# Patient Record
Sex: Male | Born: 1950 | Race: White | Hispanic: No | Marital: Married | Smoking: Former smoker
Health system: Southern US, Community
[De-identification: ages and names within clinical notes are randomized; demographics above are authoritative.]

## PROBLEM LIST (undated history)

## (undated) DIAGNOSIS — K760 Fatty (change of) liver, not elsewhere classified: Secondary | ICD-10-CM

## (undated) DIAGNOSIS — R7301 Impaired fasting glucose: Secondary | ICD-10-CM

## (undated) DIAGNOSIS — R011 Cardiac murmur, unspecified: Secondary | ICD-10-CM

## (undated) DIAGNOSIS — K219 Gastro-esophageal reflux disease without esophagitis: Secondary | ICD-10-CM

## (undated) DIAGNOSIS — R7303 Prediabetes: Secondary | ICD-10-CM

## (undated) DIAGNOSIS — K7460 Unspecified cirrhosis of liver: Secondary | ICD-10-CM

## (undated) DIAGNOSIS — Z87442 Personal history of urinary calculi: Secondary | ICD-10-CM

## (undated) DIAGNOSIS — R001 Bradycardia, unspecified: Secondary | ICD-10-CM

## (undated) DIAGNOSIS — R7989 Other specified abnormal findings of blood chemistry: Secondary | ICD-10-CM

## (undated) DIAGNOSIS — T7840XA Allergy, unspecified, initial encounter: Secondary | ICD-10-CM

## (undated) DIAGNOSIS — M109 Gout, unspecified: Secondary | ICD-10-CM

## (undated) DIAGNOSIS — Z973 Presence of spectacles and contact lenses: Secondary | ICD-10-CM

## (undated) DIAGNOSIS — N400 Enlarged prostate without lower urinary tract symptoms: Secondary | ICD-10-CM

## (undated) DIAGNOSIS — E119 Type 2 diabetes mellitus without complications: Secondary | ICD-10-CM

## (undated) DIAGNOSIS — N189 Chronic kidney disease, unspecified: Secondary | ICD-10-CM

## (undated) DIAGNOSIS — I878 Other specified disorders of veins: Secondary | ICD-10-CM

## (undated) DIAGNOSIS — I340 Nonrheumatic mitral (valve) insufficiency: Secondary | ICD-10-CM

## (undated) DIAGNOSIS — I10 Essential (primary) hypertension: Secondary | ICD-10-CM

## (undated) HISTORY — DX: Cardiac murmur, unspecified: R01.1

## (undated) HISTORY — DX: Other specified disorders of veins: I87.8

## (undated) HISTORY — PX: EXTRACORPOREAL SHOCK WAVE LITHOTRIPSY: SHX1557

## (undated) HISTORY — PX: COLONOSCOPY: SHX174

## (undated) HISTORY — DX: Impaired fasting glucose: R73.01

## (undated) HISTORY — DX: Bradycardia, unspecified: R00.1

## (undated) HISTORY — DX: Other specified abnormal findings of blood chemistry: R79.89

## (undated) HISTORY — DX: Allergy, unspecified, initial encounter: T78.40XA

## (undated) HISTORY — PX: KIDNEY STONE SURGERY: SHX686

## (undated) HISTORY — DX: Benign prostatic hyperplasia without lower urinary tract symptoms: N40.0

## (undated) HISTORY — DX: Nonrheumatic mitral (valve) insufficiency: I34.0

## (undated) HISTORY — PX: CHOLECYSTECTOMY: SHX55

## (undated) HISTORY — DX: Gout, unspecified: M10.9

## (undated) HISTORY — DX: Fatty (change of) liver, not elsewhere classified: K76.0

## (undated) HISTORY — DX: Unspecified cirrhosis of liver: K74.60

## (undated) HISTORY — DX: Chronic kidney disease, unspecified: N18.9

## (undated) HISTORY — PX: CARDIOVASCULAR STRESS TEST: SHX262

## (undated) HISTORY — PX: VASECTOMY: SHX75

## (undated) HISTORY — DX: Gastro-esophageal reflux disease without esophagitis: K21.9

---

## 2001-06-28 ENCOUNTER — Encounter: Payer: Self-pay | Admitting: Urology

## 2001-06-28 ENCOUNTER — Ambulatory Visit (HOSPITAL_COMMUNITY): Admission: RE | Admit: 2001-06-28 | Discharge: 2001-06-28 | Payer: Self-pay | Admitting: Urology

## 2001-06-30 ENCOUNTER — Encounter: Payer: Self-pay | Admitting: Urology

## 2001-06-30 ENCOUNTER — Ambulatory Visit (HOSPITAL_COMMUNITY): Admission: RE | Admit: 2001-06-30 | Discharge: 2001-06-30 | Payer: Self-pay | Admitting: Urology

## 2001-07-20 ENCOUNTER — Ambulatory Visit (HOSPITAL_COMMUNITY): Admission: RE | Admit: 2001-07-20 | Discharge: 2001-07-20 | Payer: Self-pay | Admitting: Urology

## 2001-09-23 ENCOUNTER — Ambulatory Visit (HOSPITAL_COMMUNITY): Admission: RE | Admit: 2001-09-23 | Discharge: 2001-09-23 | Payer: Self-pay | Admitting: Urology

## 2001-09-23 ENCOUNTER — Encounter: Payer: Self-pay | Admitting: Urology

## 2002-03-28 ENCOUNTER — Ambulatory Visit (HOSPITAL_COMMUNITY): Admission: RE | Admit: 2002-03-28 | Discharge: 2002-03-28 | Payer: Self-pay | Admitting: Urology

## 2002-03-28 ENCOUNTER — Encounter: Payer: Self-pay | Admitting: Urology

## 2002-10-06 ENCOUNTER — Ambulatory Visit (HOSPITAL_COMMUNITY): Admission: RE | Admit: 2002-10-06 | Discharge: 2002-10-06 | Payer: Self-pay | Admitting: Urology

## 2002-10-06 ENCOUNTER — Encounter: Payer: Self-pay | Admitting: Urology

## 2003-01-05 ENCOUNTER — Ambulatory Visit (HOSPITAL_COMMUNITY): Admission: RE | Admit: 2003-01-05 | Discharge: 2003-01-05 | Payer: Self-pay | Admitting: General Surgery

## 2003-12-03 ENCOUNTER — Ambulatory Visit (HOSPITAL_COMMUNITY): Admission: RE | Admit: 2003-12-03 | Discharge: 2003-12-03 | Payer: Self-pay | Admitting: Family Medicine

## 2005-06-01 ENCOUNTER — Ambulatory Visit (HOSPITAL_COMMUNITY): Admission: RE | Admit: 2005-06-01 | Discharge: 2005-06-01 | Payer: Self-pay | Admitting: Family Medicine

## 2005-06-15 ENCOUNTER — Ambulatory Visit (HOSPITAL_COMMUNITY): Admission: RE | Admit: 2005-06-15 | Discharge: 2005-06-15 | Payer: Self-pay | Admitting: Family Medicine

## 2006-07-05 ENCOUNTER — Ambulatory Visit (HOSPITAL_COMMUNITY): Admission: RE | Admit: 2006-07-05 | Discharge: 2006-07-05 | Payer: Self-pay | Admitting: Family Medicine

## 2008-05-18 ENCOUNTER — Ambulatory Visit (HOSPITAL_COMMUNITY): Admission: RE | Admit: 2008-05-18 | Discharge: 2008-05-18 | Payer: Self-pay | Admitting: General Surgery

## 2008-05-18 ENCOUNTER — Encounter (INDEPENDENT_AMBULATORY_CARE_PROVIDER_SITE_OTHER): Payer: Self-pay | Admitting: General Surgery

## 2010-04-27 ENCOUNTER — Encounter: Payer: Self-pay | Admitting: Family Medicine

## 2012-06-22 ENCOUNTER — Encounter: Payer: Self-pay | Admitting: Family Medicine

## 2012-06-22 ENCOUNTER — Ambulatory Visit (INDEPENDENT_AMBULATORY_CARE_PROVIDER_SITE_OTHER): Payer: Managed Care, Other (non HMO) | Admitting: Family Medicine

## 2012-06-22 VITALS — BP 120/64 | Temp 98.8°F | Wt 225.4 lb

## 2012-06-22 DIAGNOSIS — J329 Chronic sinusitis, unspecified: Secondary | ICD-10-CM

## 2012-06-22 DIAGNOSIS — I10 Essential (primary) hypertension: Secondary | ICD-10-CM

## 2012-06-22 MED ORDER — LEVOFLOXACIN 500 MG PO TABS: 500.0000 mg | ORAL_TABLET | Freq: Every day | ORAL | Status: AC

## 2012-06-22 NOTE — Progress Notes (Signed)
  Subjective:    Patient ID: Mark Leonard, male    DOB: 09-13-1950, 62 y.o.   MRN: [id number]  Sinusitis This is a recurrent problem. The current episode started 1 to 4 weeks ago. The problem has been gradually worsening since onset. There has been no fever. The pain is mild. Associated symptoms include congestion, coughing (productive), headaches, a hoarse voice and sinus pressure. Past treatments include oral decongestants and acetaminophen. The treatment provided mild relief.      Review of Systems  HENT: Positive for congestion, hoarse voice and sinus pressure.   Respiratory: Positive for cough (productive).   Neurological: Positive for headaches.  All other systems reviewed and are negative.       Objective:   Physical Exam  Constitutional: He appears well-developed.  HENT:  Head: Normocephalic.  Right Ear: External ear normal.  Left Ear: External ear normal.  Eyes: Pupils are equal, round, and reactive to light.  Neck: Normal range of motion. Neck supple.  Cardiovascular: Normal rate and regular rhythm.   Pulmonary/Chest: Effort normal. No respiratory distress.  Abdominal: Bowel sounds are normal.  Musculoskeletal: Normal range of motion.  Lymphadenopathy:    He has cervical adenopathy.  Neurological: He is alert.  Skin: Skin is warm.          Assessment & Plan:  Impression #1 acute sinusitis. Plan Levaquin 500 milligrams daily. Symptomatic care discussed. Warning signs discussed.

## 2012-08-25 ENCOUNTER — Other Ambulatory Visit: Payer: Self-pay | Admitting: Family Medicine

## 2012-09-21 ENCOUNTER — Ambulatory Visit (HOSPITAL_COMMUNITY)
Admission: RE | Admit: 2012-09-21 | Discharge: 2012-09-21 | Disposition: A | Discharge status: Home / Self Care | Payer: Managed Care, Other (non HMO) | Source: Ambulatory Visit | Attending: Family Medicine | Admitting: Family Medicine

## 2012-09-21 ENCOUNTER — Encounter: Payer: Self-pay | Admitting: Family Medicine

## 2012-09-21 ENCOUNTER — Ambulatory Visit (INDEPENDENT_AMBULATORY_CARE_PROVIDER_SITE_OTHER): Payer: Managed Care, Other (non HMO) | Admitting: Family Medicine

## 2012-09-21 VITALS — BP 142/94 | HR 80 | Ht 67.0 in | Wt 228.0 lb

## 2012-09-21 DIAGNOSIS — K7689 Other specified diseases of liver: Secondary | ICD-10-CM

## 2012-09-21 DIAGNOSIS — M25562 Pain in left knee: Secondary | ICD-10-CM

## 2012-09-21 DIAGNOSIS — K219 Gastro-esophageal reflux disease without esophagitis: Secondary | ICD-10-CM

## 2012-09-21 DIAGNOSIS — I878 Other specified disorders of veins: Secondary | ICD-10-CM | POA: Insufficient documentation

## 2012-09-21 DIAGNOSIS — R7301 Impaired fasting glucose: Secondary | ICD-10-CM | POA: Insufficient documentation

## 2012-09-21 DIAGNOSIS — N400 Enlarged prostate without lower urinary tract symptoms: Secondary | ICD-10-CM

## 2012-09-21 DIAGNOSIS — M109 Gout, unspecified: Secondary | ICD-10-CM | POA: Insufficient documentation

## 2012-09-21 DIAGNOSIS — N138 Other obstructive and reflux uropathy: Secondary | ICD-10-CM | POA: Insufficient documentation

## 2012-09-21 DIAGNOSIS — M25569 Pain in unspecified knee: Secondary | ICD-10-CM

## 2012-09-21 DIAGNOSIS — I872 Venous insufficiency (chronic) (peripheral): Secondary | ICD-10-CM

## 2012-09-21 DIAGNOSIS — K760 Fatty (change of) liver, not elsewhere classified: Secondary | ICD-10-CM | POA: Insufficient documentation

## 2012-09-21 MED ORDER — ALLOPURINOL 300 MG PO TABS: 300.0000 mg | ORAL_TABLET | Freq: Every day | ORAL | Status: DC

## 2012-09-21 MED ORDER — DICLOFENAC SODIUM 75 MG PO TBEC: 75.0000 mg | DELAYED_RELEASE_TABLET | Freq: Two times a day (BID) | ORAL | Status: DC

## 2012-09-21 MED ORDER — ENALAPRIL MALEATE 20 MG PO TABS: 20.0000 mg | ORAL_TABLET | Freq: Every day | ORAL | Status: DC

## 2012-09-21 MED ORDER — ESOMEPRAZOLE MAGNESIUM 40 MG PO CPDR: 40.0000 mg | DELAYED_RELEASE_CAPSULE | Freq: Every day | ORAL | Status: DC

## 2012-09-21 MED ORDER — FINASTERIDE 5 MG PO TABS: 5.0000 mg | ORAL_TABLET | Freq: Every day | ORAL | Status: DC

## 2012-09-21 MED ORDER — METRONIDAZOLE 0.75 % EX CREA: TOPICAL_CREAM | Freq: Two times a day (BID) | CUTANEOUS | Status: DC

## 2012-09-21 MED ORDER — ENALAPRIL MALEATE 10 MG PO TABS: 10.0000 mg | ORAL_TABLET | Freq: Every day | ORAL | Status: DC

## 2012-09-21 NOTE — Patient Instructions (Signed)
Try to watch your diet and exercise regularly

## 2012-09-21 NOTE — Progress Notes (Signed)
  Subjective:    Patient ID: Mark Leonard, male    DOB: 1951/02/01, 62 y.o.   MRN: [id number]  HPI conmpliant with meds.  Trying to watch his diet--not the best, not exercising much.  Increased stress with family issues, mo living ewith them  Left medial knee pain, hurts to walk and rolll over. agravated by hard surface and steel toes. Recalls no acute injur. ibu 600 bid not helping much.  Return of gout like pain in left foot  Review of Systems  Constitutional: Negative for fever, activity change and appetite change.  HENT: Negative for congestion, rhinorrhea and neck pain.   Eyes: Negative for discharge.  Respiratory: Negative for cough and wheezing.   Cardiovascular: Negative for chest pain.  Gastrointestinal: Negative for vomiting, abdominal pain and blood in stool.  Genitourinary: Negative for frequency and difficulty urinating.  Musculoskeletal: Positive for joint swelling.  Skin: Negative for rash.  Allergic/Immunologic: Negative for environmental allergies and food allergies.  Neurological: Negative for weakness and headaches.  Psychiatric/Behavioral: Negative for agitation.       Objective:   Physical Exam  Vitals reviewed. Constitutional: He appears well-developed and well-nourished.  Obesity present  HENT:  Head: Normocephalic and atraumatic.  Right Ear: External ear normal.  Left Ear: External ear normal.  Nose: Nose normal.  Mouth/Throat: Oropharynx is clear and moist.  Eyes: EOM are normal. Pupils are equal, round, and reactive to light.  Neck: Normal range of motion. Neck supple. No thyromegaly present.  Cardiovascular: Normal rate, regular rhythm and normal heart sounds.   No murmur heard. Pulmonary/Chest: Effort normal and breath sounds normal. No respiratory distress. He has no wheezes.  Abdominal: Soft. Bowel sounds are normal. He exhibits no distension and no mass. There is no tenderness.  Genitourinary: Penis normal.  Musculoskeletal: Normal  range of motion. He exhibits no edema.  Distinct tenderness left medial knee. Some crepitations bilaterally.  Lymphadenopathy:    He has no cervical adenopathy.  Neurological: He is alert. He exhibits normal muscle tone.  Skin: Skin is warm and dry. No erythema.  Psychiatric: He has a normal mood and affect. His behavior is normal. Judgment normal.          Assessment & Plan:  Impression #1 wellness exam. #2 knee pain possibly flare of arthritis but need to consider medial meniscus. Plan Hemoccult cards. Appropriate blood work. Meds refilled. Trial Voltaren 75 twice a day with food for knee. X-ray of knee. Check every 6 months. Diet and exercise discussed in encourage.

## 2012-10-18 ENCOUNTER — Ambulatory Visit (INDEPENDENT_AMBULATORY_CARE_PROVIDER_SITE_OTHER): Payer: Managed Care, Other (non HMO) | Admitting: Family Medicine

## 2012-10-18 ENCOUNTER — Encounter: Payer: Self-pay | Admitting: Family Medicine

## 2012-10-18 VITALS — BP 132/84 | Wt 233.8 lb

## 2012-10-18 DIAGNOSIS — M545 Low back pain: Secondary | ICD-10-CM

## 2012-10-18 NOTE — Patient Instructions (Signed)
Back Exercises Back exercises help treat and prevent back injuries. The goal of back exercises is to increase the strength of your abdominal and back muscles and the flexibility of your back. These exercises should be started when you no longer have back pain. Back exercises include:  Pelvic Tilt. Lie on your back with your knees bent. Tilt your pelvis until the lower part of your back is against the floor. Hold this position 5 to 10 sec and repeat 5 to 10 times.  Knee to Chest. Pull first 1 knee up against your chest and hold for 20 to 30 seconds, repeat this with the other knee, and then both knees. This may be done with the other leg straight or bent, whichever feels better.  Sit-Ups or Curl-Ups. Bend your knees 90 degrees. Start with tilting your pelvis, and do a partial, slow sit-up, lifting your trunk only 30 to 45 degrees off the floor. Take at least 2 to 3 seconds for each sit-up. Do not do sit-ups with your knees out straight. If partial sit-ups are difficult, simply do the above but with only tightening your abdominal muscles and holding it as directed.  Hip-Lift. Lie on your back with your knees flexed 90 degrees. Push down with your feet and shoulders as you raise your hips a couple inches off the floor; hold for 10 seconds, repeat 5 to 10 times.  Back arches. Lie on your stomach, propping yourself up on bent elbows. Slowly press on your hands, causing an arch in your low back. Repeat 3 to 5 times. Any initial stiffness and discomfort should lessen with repetition over time.  Shoulder-Lifts. Lie face down with arms beside your body. Keep hips and torso pressed to floor as you slowly lift your head and shoulders off the floor. Do not overdo your exercises, especially in the beginning. Exercises may cause you some mild back discomfort which lasts for a few minutes; however, if the pain is more severe, or lasts for more than 15 minutes, do not continue exercises until you see your caregiver.  Improvement with exercise therapy for back problems is slow.  See your caregivers for assistance with developing a proper back exercise program. Document Released: 04/30/2004 Document Revised: 06/15/2011 Document Reviewed: 01/22/2011 [employer] Patient Information 2014 [employer].  

## 2012-10-18 NOTE — Progress Notes (Signed)
  Subjective:    Patient ID: Mark Leonard, male    DOB: [birth year], 62 y.o.   MRN: [id number]  Back Pain This is a new problem. The current episode started 1 to 4 weeks ago. The problem occurs constantly. The problem has been gradually worsening since onset. The pain is present in the lumbar spine. The pain radiates to the left knee. The pain is at a severity of 6/10. The pain is moderate. The pain is worse during the day. The symptoms are aggravated by bending. Stiffness is present in the morning. Pertinent negatives include no numbness or paresthesias. Risk factors include lack of exercise (also working sig overtime). He has tried analgesics for the symptoms. The treatment provided mild relief.    Bad pain at night too. Stiff. Worse with movemnent. Pain has improved  Review of Systems  Musculoskeletal: Positive for back pain.  Neurological: Negative for numbness and paresthesias.       Objective:   Physical Exam  Alert no acute distress. Lungs clear. Heart regular rate and rhythm. Left lower lumbar tenderness to deep palpation. Negative straight leg raise. Knee less tenderness.      Assessment & Plan:  Impression lumbar strain-discussed. Local measures discussed. Anti-inflammatory medicine when necessary. Chlorzoxazone when necessary. Exercise long-term important discussed. WSL

## 2013-03-13 ENCOUNTER — Encounter: Payer: Self-pay | Admitting: Family Medicine

## 2013-03-13 ENCOUNTER — Ambulatory Visit (INDEPENDENT_AMBULATORY_CARE_PROVIDER_SITE_OTHER): Payer: Managed Care, Other (non HMO) | Admitting: Family Medicine

## 2013-03-13 VITALS — BP 138/88 | Ht 68.0 in | Wt 228.8 lb

## 2013-03-13 DIAGNOSIS — R7301 Impaired fasting glucose: Secondary | ICD-10-CM

## 2013-03-13 DIAGNOSIS — N400 Enlarged prostate without lower urinary tract symptoms: Secondary | ICD-10-CM

## 2013-03-13 DIAGNOSIS — E119 Type 2 diabetes mellitus without complications: Secondary | ICD-10-CM

## 2013-03-13 DIAGNOSIS — K219 Gastro-esophageal reflux disease without esophagitis: Secondary | ICD-10-CM

## 2013-03-13 DIAGNOSIS — Z79899 Other long term (current) drug therapy: Secondary | ICD-10-CM

## 2013-03-13 DIAGNOSIS — E782 Mixed hyperlipidemia: Secondary | ICD-10-CM

## 2013-03-13 DIAGNOSIS — M109 Gout, unspecified: Secondary | ICD-10-CM

## 2013-03-13 DIAGNOSIS — I10 Essential (primary) hypertension: Secondary | ICD-10-CM

## 2013-03-13 MED ORDER — ESOMEPRAZOLE MAGNESIUM 40 MG PO CPDR: 40.0000 mg | DELAYED_RELEASE_CAPSULE | Freq: Every day | ORAL | Status: DC

## 2013-03-13 MED ORDER — FINASTERIDE 5 MG PO TABS: 5.0000 mg | ORAL_TABLET | Freq: Every day | ORAL | Status: DC

## 2013-03-13 MED ORDER — ENALAPRIL MALEATE 20 MG PO TABS: 20.0000 mg | ORAL_TABLET | Freq: Every day | ORAL | Status: DC

## 2013-03-13 MED ORDER — ALLOPURINOL 300 MG PO TABS: 300.0000 mg | ORAL_TABLET | Freq: Every day | ORAL | Status: DC

## 2013-03-13 MED ORDER — METRONIDAZOLE 0.75 % EX CREA: TOPICAL_CREAM | Freq: Two times a day (BID) | CUTANEOUS | Status: DC

## 2013-03-13 NOTE — Progress Notes (Signed)
   Subjective:    Patient ID: Mark Leonard, male    DOB: 07-21-1950, 62 y.o.   MRN: [id number]  HPI Patient arrives for a follow up on blood pressure. Notes increased salt intake. Had more than he should. 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 BP syst in 140s.  and to discuss blood work results from work.  No attacks og gout. Claims compliance with medications.  Organ meats seemed to make worse,   Heartburn and reflux overall stable. Takes reflux med first thing in the morning.  Overall urinating at night Sunder good control. Compliant with his medication for this. No obvious side effects.  Brings in blood work from work place. This includes an A1c of 6.8%.    Review of Systems Claims no excess her is no headache no chest pain no shortness breath no abdominal pain no change in bowel habits notes ongoing weight gain   ROS otherwise negative Objective:   Physical Exam Alert HEENT normal. Lungs clear. Heart regular in rhythm. Blood pressure 134/82 on repeat. Ankles without edema. Labs reviewed       Assessment & Plan:  Pressure 1 hypertension good control. #2 reflux good control. #3 prostate hypertrophy stable. #4 gout no recent recurrence. #5 diabetes new diagnosis discussed at length plan appropriate blood work return for full diabetes visit soon. WSL

## 2013-03-15 LAB — LIPID PANEL
Cholesterol: 69 mg/dL (ref 0–200)
HDL: 35 mg/dL — ABNORMAL LOW (ref >39)

## 2013-03-15 LAB — HEPATIC FUNCTION PANEL
Albumin: 3.9 g/dL (ref 3.5–5.2)
Alkaline Phosphatase: 99 U/L (ref 39–117)
Indirect Bilirubin: 0.4 mg/dL (ref 0.0–0.9)
Total Bilirubin: 0.7 mg/dL (ref 0.3–1.2)
Total Protein: 6.6 g/dL (ref 6.0–8.3)

## 2013-03-15 LAB — MICROALBUMIN, URINE: Microalb, Ur: 0.5 mg/dL (ref 0.00–1.89)

## 2013-03-24 ENCOUNTER — Ambulatory Visit (INDEPENDENT_AMBULATORY_CARE_PROVIDER_SITE_OTHER): Payer: Managed Care, Other (non HMO) | Admitting: Family Medicine

## 2013-03-24 ENCOUNTER — Encounter: Payer: Self-pay | Admitting: Family Medicine

## 2013-03-24 VITALS — BP 130/80 | Ht 68.0 in | Wt 223.0 lb

## 2013-03-24 DIAGNOSIS — R739 Hyperglycemia, unspecified: Secondary | ICD-10-CM

## 2013-03-24 DIAGNOSIS — Z23 Encounter for immunization: Secondary | ICD-10-CM

## 2013-03-24 DIAGNOSIS — R7309 Other abnormal glucose: Secondary | ICD-10-CM

## 2013-03-24 NOTE — Progress Notes (Signed)
   Subjective:    Patient ID: Mark Leonard, male    DOB: 06-02-1950, 62 y.o.   MRN: [id number]  HPI  Patient arrives to follow up on recent blood work results.  Patient had blood work at his workplace which showed an A1c of 6.8%. He returns for further discussion in this regard with his diagnosis of new onset type 2 diabetes. Notes some increased urination. Some hot visual changes. Next  Also notes very poor diet.  Also unfortunately still does not exercise, though he worked very hard with his job.  Compliant with other medications. Results for orders placed in visit on 03/24/13  GLUCOSE, POCT (MANUAL RESULT ENTRY)      Result Value Range   POC Glucose 102 (*) 70 - 99 mg/dl  POCT GLYCOSYLATED HEMOGLOBIN (HGB A1C)      Result Value Range   Hemoglobin A1C 6.8       Review of Systems No headache no chest pain no back pain no abdominal pain no change in bowel habits no blood in stool    Objective:   Physical Exam  Alert HEENT normal. Lungs clear. Heart regular in rhythm. Ankles without edema.      Assessment & Plan:  Impression 1 new onset type 2 diabetes discussed at great length. Please see patient instructions. Each of these generated considerable questions discussion Mark Leonard. Easily 35 minutes spent with patient most in discussion. Plan, or prescribed. 2 early for medications. Pneumonia shot today. Followup as scheduled. Check a couple fasting sugars per week. Attending [employer] educational session. Rationale discussed. Educational information given. WSL

## 2013-03-24 NOTE — Patient Instructions (Signed)
Yearly eye exams from here on out  6.8% on A1c--this reconfirms the diagnosis, one that is permanent  ADA says that fasting sugar goals are from 70 to 130  Pneumonia vaccine is a good idea  Shingles vaccine is a good idea Diabetes and Exercise Exercising regularly is important. It is not just about losing weight. It has many health benefits, such as:  Improving your overall fitness, flexibility, and endurance.  Increasing your bone density.  Helping with weight control.  Decreasing your body fat.  Increasing your muscle strength.  Reducing stress and tension.  Improving your overall health. People with diabetes who exercise gain additional benefits because exercise:  Reduces appetite.  Improves the body's use of blood sugar (glucose).  Helps lower or control blood glucose.  Decreases blood pressure.  Helps control blood lipids (such as cholesterol and triglycerides).  Improves the body's use of the hormone insulin by:  Increasing the body's insulin sensitivity.  Reducing the body's insulin needs.  Decreases the risk for heart disease because exercising:  Lowers cholesterol and triglycerides levels.  Increases the levels of good cholesterol (such as high-density lipoproteins [HDL]) in the body.  Lowers blood glucose levels. YOUR ACTIVITY PLAN  Choose an activity that you enjoy and set realistic goals. Your health care provider or diabetes educator can help you make an activity plan that works for you. You can break activities into 2 or 3 sessions throughout the day. Doing so is as good as one long session. Exercise ideas include:  Taking the dog for a walk.  Taking the stairs instead of the elevator.  Dancing to your favorite song.  Doing your favorite exercise with a friend. RECOMMENDATIONS FOR EXERCISING WITH TYPE 1 OR TYPE 2 DIABETES   Check your blood glucose before exercising. If blood glucose levels are greater than 240 mg/dL, check for urine  ketones. Do not exercise if ketones are present.  Avoid injecting insulin into areas of the body that are going to be exercised. For example, avoid injecting insulin into:  The arms when playing tennis.  The legs when jogging.  Keep a record of:  Food intake before and after you exercise.  Expected peak times of insulin action.  Blood glucose levels before and after you exercise.  The type and amount of exercise you have done.  Review your records with your health care provider. Your health care provider will help you to develop guidelines for adjusting food intake and insulin amounts before and after exercising.  If you take insulin or oral hypoglycemic agents, watch for signs and symptoms of hypoglycemia. They include:  Dizziness.  Shaking.  Sweating.  Chills.  Confusion.  Drink plenty of water while you exercise to prevent dehydration or heat stroke. Body water is lost during exercise and must be replaced.  Talk to your health care provider before starting an exercise program to make sure it is safe for you. Remember, almost any type of activity is better than none. Document Released: 06/13/2003 Document Revised: 11/23/2012 Document Reviewed: 08/30/2012 [facility] Patient Information 2014 [place], [place]. Exercise is crucial, need to try and do three to four times per wk  If you can lose only ten to fifteen pounds of fat, that will considerably change how your body metabolized sugar  Good idea to do the educational session at the hospital--see the sheet  Medication for diabetes: we initiate when the A1c exceeds 7.0 per cent, lots of good choices

## 2013-03-27 ENCOUNTER — Telehealth: Payer: Self-pay | Admitting: Family Medicine

## 2013-03-27 NOTE — Telephone Encounter (Signed)
It doesn't matter which brand name glucometer the patient uses. Patient notified.

## 2013-03-27 NOTE — Telephone Encounter (Signed)
Patient says that he called the insurance company to see if they had a preference for the glucose machine that they pay for, but they told him they would just send him one in the mail. He called to make sure that it was okay if the brand was switched?

## 2013-04-07 ENCOUNTER — Telehealth (HOSPITAL_COMMUNITY): Payer: Self-pay | Admitting: Dietician

## 2013-04-07 NOTE — Telephone Encounter (Signed)
Called at 1011. Pt registered for 04/11/12 at 1000 (group class).

## 2013-04-07 NOTE — Telephone Encounter (Signed)
Received call from pt at 0940. Recently dx DM, Dr. Wolfgang Phoenix wants him to attend class.

## 2013-04-11 ENCOUNTER — Encounter (HOSPITAL_COMMUNITY): Payer: Self-pay | Admitting: Dietician

## 2013-04-11 NOTE — Progress Notes (Signed)
[facility] Diabetes Class Completion  Date:April 11, 2013  Time: 1000  Pt attended [facility]'s Diabetes Group Education Class on April 11, 2013.   Patient was educated on the following topics:   -Survival skills (signs and symptoms of hyperglycemia and hypoglycemia, treatment for hypoglycemia, ideal levels for fasting and postprandial blood sugars, goal Hgb A1c level, foot care basics)  -Recommendations for physical activity   -Carbohydrate metabolism in relation to diabetes   -Meal planning (sources of carbohydrate, carbohydrate counting, meal planning strategies, food label reading, and portion control).  Handouts provided:  -"Diabetes and You: Taking Charge of Your Health"  -"Carbohydrate Counting and Meal Planning"  -"Your Guide to Better Office Visits"   Nikitta Sobiech A. Evelyn Aguinaldo, RD, LDN  

## 2013-04-14 ENCOUNTER — Telehealth: Payer: Self-pay | Admitting: Family Medicine

## 2013-04-14 NOTE — Telephone Encounter (Signed)
Patient notified

## 2013-04-14 NOTE — Telephone Encounter (Signed)
Patient needs Rx for One Touch Strips (Insurance sent him a one touch monitor)   [facility] [place]

## 2013-05-11 ENCOUNTER — Telehealth: Payer: Self-pay | Admitting: Family Medicine

## 2013-05-11 NOTE — Telephone Encounter (Signed)
Patient notified

## 2013-05-11 NOTE — Telephone Encounter (Signed)
Patient needs Rx for shingles shot

## 2013-05-26 ENCOUNTER — Encounter: Payer: Self-pay | Admitting: Family Medicine

## 2013-05-26 ENCOUNTER — Ambulatory Visit (INDEPENDENT_AMBULATORY_CARE_PROVIDER_SITE_OTHER): Payer: Managed Care, Other (non HMO) | Admitting: Family Medicine

## 2013-05-26 VITALS — BP 118/80 | Ht 67.0 in | Wt 208.0 lb

## 2013-05-26 DIAGNOSIS — I10 Essential (primary) hypertension: Secondary | ICD-10-CM

## 2013-05-26 DIAGNOSIS — K219 Gastro-esophageal reflux disease without esophagitis: Secondary | ICD-10-CM

## 2013-05-26 DIAGNOSIS — E119 Type 2 diabetes mellitus without complications: Secondary | ICD-10-CM

## 2013-05-26 MED ORDER — AMOXICILLIN-POT CLAVULANATE 875-125 MG PO TABS: 1.0000 | ORAL_TABLET | Freq: Two times a day (BID) | ORAL | Status: AC

## 2013-05-26 NOTE — Progress Notes (Signed)
   Subjective:    Patient ID: Mark Leonard, male    DOB: 12-06-1950, 63 y.o.   MRN: [id number]  HPIFollow up on bloodwork done in December. A1C 6.8 in Dec. Pt took diabetic class in January. Patient brought in blood sugar readings.   Fasting numbers mostly 80 to 90  Walked three miles for one wk  By changing shifts, went off exercise program  No exercise equip at the house,  Check ears. Having some ear pain. Started about 2 weeks ago. Hx of chronic left ear, stopped up at times  Lot of ringing in ear Also pain in the face,  Hx of discomfort and   Sinus cong and drainage recently No obvious fever.  Compliant with blood pressure medicine.  Start off with exercising but now not at all.   Review of Systems No headache no chest pain decent appetite some weight loss. No abdominal pain no change in bowel habits no blood in stools ROS otherwise negative    Objective:   Physical Exam Alert no apparent distress. Mild malaise. H&T moderate his congestion frontal tenderness pharynx slight erythema neck supple. Blood pressure good on repeat lungs clear. Heart regular in rhythm. Abdomen benign. Ankles edema. Feet sensation intact pulses good no edema.       Assessment & Plan:   impression 1 type 2 diabetes control improving discussed at length #2 hypertension good control. #Early rhinosinusitis discussed plan may change in glucose checks once per week strongly encouraged to exercise. Antibiotics written. Maintain other medicines. Diet exercise discussed. Recheck in several months. WSL

## 2013-05-27 DIAGNOSIS — E119 Type 2 diabetes mellitus without complications: Secondary | ICD-10-CM | POA: Insufficient documentation

## 2013-09-20 ENCOUNTER — Other Ambulatory Visit: Payer: Self-pay | Admitting: Family Medicine

## 2013-10-27 ENCOUNTER — Encounter: Payer: Self-pay | Admitting: Family Medicine

## 2013-10-27 ENCOUNTER — Ambulatory Visit (INDEPENDENT_AMBULATORY_CARE_PROVIDER_SITE_OTHER): Payer: Managed Care, Other (non HMO) | Admitting: Family Medicine

## 2013-10-27 VITALS — BP 130/84 | Resp 20 | Ht 67.0 in | Wt 209.0 lb

## 2013-10-27 DIAGNOSIS — Z0189 Encounter for other specified special examinations: Secondary | ICD-10-CM

## 2013-10-27 DIAGNOSIS — E119 Type 2 diabetes mellitus without complications: Secondary | ICD-10-CM

## 2013-10-27 LAB — POCT GLYCOSYLATED HEMOGLOBIN (HGB A1C): HEMOGLOBIN A1C: 5.6

## 2013-10-27 MED ORDER — FINASTERIDE 5 MG PO TABS: ORAL_TABLET | ORAL | Status: DC

## 2013-10-27 MED ORDER — ENALAPRIL MALEATE 20 MG PO TABS: ORAL_TABLET | ORAL | Status: DC

## 2013-10-27 MED ORDER — ALLOPURINOL 300 MG PO TABS: ORAL_TABLET | ORAL | Status: DC

## 2013-10-27 MED ORDER — ESOMEPRAZOLE MAGNESIUM 40 MG PO CPDR: DELAYED_RELEASE_CAPSULE | ORAL | Status: DC

## 2013-10-27 NOTE — Progress Notes (Signed)
   Subjective:    Patient ID: Mark Leonard, male    DOB: 01-Apr-1951, 63 y.o.   MRN: [id number]  HPI The patient comes in today for a wellness visit.    A review of their health history was completed.  A review of medications was also completed.  Any needed refills; yes  ^ month refill on all medications  Eating habits: *ust trying to eat better. Cutting back on sweets & carbohydrates.  Falls/  MVA accidents in past few months: No  Regular exercise: Just walking at work Specialist pt sees on regular basis:   Preventative health issues were discussed. Maintaince of blood sugar levelsmorn numbers are overall in the 90s  Additional concerns: No  Colonoscopy next not due til 2020  Shingles vaccine already done  BP numbers overall looking good,  Glu numbers are generally good    Results for orders placed in visit on 10/27/13  POCT GLYCOSYLATED HEMOGLOBIN (HGB A1C)      Result Value Ref Range   Hemoglobin A1C 5.6      Review of Systems  Constitutional: Negative for fever, activity change and appetite change.  HENT: Negative for congestion and rhinorrhea.   Eyes: Negative for discharge.  Respiratory: Negative for cough and wheezing.   Cardiovascular: Negative for chest pain.  Gastrointestinal: Negative for vomiting, abdominal pain and blood in stool.  Genitourinary: Negative for frequency and difficulty urinating.  Musculoskeletal: Negative for neck pain.       Some ongoing chronic joint pain.  Skin: Negative for rash.  Allergic/Immunologic: Negative for environmental allergies and food allergies.  Neurological: Negative for weakness and headaches.  Psychiatric/Behavioral: Negative for agitation.  All other systems reviewed and are negative.      Objective:   Physical Exam  Vitals reviewed. Constitutional: He appears well-developed and well-nourished.  Obesity present  HENT:  Head: Normocephalic and atraumatic.  Right Ear: External ear normal.  Left Ear:  External ear normal.  Nose: Nose normal.  Mouth/Throat: Oropharynx is clear and moist.  Eyes: EOM are normal. Pupils are equal, round, and reactive to light.  Neck: Normal range of motion. Neck supple. No thyromegaly present.  Cardiovascular: Normal rate, regular rhythm and normal heart sounds.   No murmur heard. Pulmonary/Chest: Effort normal and breath sounds normal. No respiratory distress. He has no wheezes.  Abdominal: Soft. Bowel sounds are normal. He exhibits no distension and no mass. There is no tenderness.  Genitourinary: Penis normal.  Musculoskeletal: Normal range of motion. He exhibits no edema.  Lymphadenopathy:    He has no cervical adenopathy.  Neurological: He is alert. He exhibits normal muscle tone.  Skin: Skin is warm and dry. No erythema.  Psychiatric: He has a normal mood and affect. His behavior is normal. Judgment normal.          Assessment & Plan:  Impression 1 wellness exam #2 type 2 diabetes good control discussed. Plan Diet exercise discussed. Yearly eye Dr. visits. Yearly flu vaccine. Up-to-date on colonoscopy. Hemoccult cards. Maintain same regimen for diabetes. Followup as scheduled. WSL

## 2014-04-09 ENCOUNTER — Other Ambulatory Visit: Payer: Self-pay | Admitting: Family Medicine

## 2014-05-25 ENCOUNTER — Encounter: Payer: Self-pay | Admitting: Family Medicine

## 2014-05-25 ENCOUNTER — Ambulatory Visit (INDEPENDENT_AMBULATORY_CARE_PROVIDER_SITE_OTHER): Payer: Managed Care, Other (non HMO) | Admitting: Family Medicine

## 2014-05-25 VITALS — BP 140/90 | Ht 67.0 in | Wt 222.2 lb

## 2014-05-25 DIAGNOSIS — K219 Gastro-esophageal reflux disease without esophagitis: Secondary | ICD-10-CM

## 2014-05-25 DIAGNOSIS — E119 Type 2 diabetes mellitus without complications: Secondary | ICD-10-CM

## 2014-05-25 DIAGNOSIS — I10 Essential (primary) hypertension: Secondary | ICD-10-CM | POA: Diagnosis not present

## 2014-05-25 DIAGNOSIS — N400 Enlarged prostate without lower urinary tract symptoms: Secondary | ICD-10-CM | POA: Diagnosis not present

## 2014-05-25 LAB — POCT GLYCOSYLATED HEMOGLOBIN (HGB A1C): Hemoglobin A1C: 5.9

## 2014-05-25 MED ORDER — FINASTERIDE 5 MG PO TABS: ORAL_TABLET | ORAL | Status: DC

## 2014-05-25 MED ORDER — ALLOPURINOL 300 MG PO TABS: ORAL_TABLET | ORAL | Status: DC

## 2014-05-25 MED ORDER — ENALAPRIL MALEATE 20 MG PO TABS: ORAL_TABLET | ORAL | Status: DC

## 2014-05-25 MED ORDER — ESOMEPRAZOLE MAGNESIUM 40 MG PO CPDR: DELAYED_RELEASE_CAPSULE | ORAL | Status: DC

## 2014-05-25 NOTE — Progress Notes (Signed)
   Subjective:    Patient ID: Mark Leonard, male    DOB: 03/06/1951, 64 y.o.   MRN: [id number]  Diabetes He presents for his follow-up diabetic visit. He has type 2 diabetes mellitus. His disease course has been stable. There are no hypoglycemic associated symptoms. There are no diabetic associated symptoms. There are no hypoglycemic complications. Symptoms are stable. There are no diabetic complications. There are no known risk factors for coronary artery disease. Current diabetic treatment includes diet. He is compliant with treatment all of the time.  Patient states that he has no other concerns at this time.   Results for orders placed or performed in visit on 05/25/14  POCT glycosylated hemoglobin (Hb A1C)  Result Value Ref Range   Hemoglobin A1C 5.9     Exercising a fair amnt, walking a lot at work  Out and about on the weekend  Eye doc visit soon ithin next month  Notes some chang e in vision  BPs generally good when cked wlsewhere. Claims compliance with blood pressure medicine. No obvious side effects. Watching salt intake.  Reflux handling well, on gen for of nexium. Without has significantly difficult problems.    Review of Systems No headache no chest pain no back pain abdominal pain no change in bowel habits no blood in stool ROS otherwise negative    Objective:   Physical Exam  Alert no acute distress blood pressure good on repeat H&T normal. Lungs clear heart rare rhythm ankles without edema.      Assessment & Plan:  Impression 1 type 2 diabetes good control discussed #2 hypertension good control discussed #3 reflux good control with need for medication. See plan plan add one Tums and multivitamin daily to counteract proton pump inhibitor affect. Maintain other medications. Diet exercise discussed. Recheck in 6 months. WSL

## 2014-05-26 LAB — MICROALBUMIN, URINE: Microalb, Ur: 0.4 mg/dL (ref <2.0)

## 2014-05-31 ENCOUNTER — Encounter: Payer: Self-pay | Admitting: Family Medicine

## 2014-06-11 ENCOUNTER — Telehealth: Payer: Self-pay | Admitting: Family Medicine

## 2014-06-11 MED ORDER — ENALAPRIL MALEATE 20 MG PO TABS: ORAL_TABLET | ORAL | Status: DC

## 2014-06-11 MED ORDER — ALLOPURINOL 300 MG PO TABS: ORAL_TABLET | ORAL | Status: DC

## 2014-06-11 MED ORDER — FINASTERIDE 5 MG PO TABS: ORAL_TABLET | ORAL | Status: DC

## 2014-06-11 MED ORDER — ESOMEPRAZOLE MAGNESIUM 40 MG PO CPDR: DELAYED_RELEASE_CAPSULE | ORAL | Status: DC

## 2014-06-11 NOTE — Telephone Encounter (Signed)
Rx sent electronically to [facility] mail order pharmacy. Patient notified.

## 2014-06-11 NOTE — Telephone Encounter (Signed)
Pts states that all his meds need to know go through  [facility] order. All meds from this date forward need to be refilled Or sent to them, call them at [phone] for further faxing/escript Details   He just had a refill x6 mo, this needs to be sent from [place] to [facility]

## 2014-07-18 ENCOUNTER — Ambulatory Visit (HOSPITAL_COMMUNITY)
Admission: RE | Admit: 2014-07-18 | Discharge: 2014-07-18 | Disposition: A | Discharge status: Home / Self Care | Payer: Managed Care, Other (non HMO) | Source: Ambulatory Visit | Attending: Family Medicine | Admitting: Family Medicine

## 2014-07-18 ENCOUNTER — Ambulatory Visit (INDEPENDENT_AMBULATORY_CARE_PROVIDER_SITE_OTHER): Payer: Managed Care, Other (non HMO) | Admitting: Family Medicine

## 2014-07-18 ENCOUNTER — Encounter: Payer: Self-pay | Admitting: Family Medicine

## 2014-07-18 VITALS — BP 140/80 | Temp 98.5°F | Ht 67.0 in | Wt 224.0 lb

## 2014-07-18 DIAGNOSIS — M7989 Other specified soft tissue disorders: Secondary | ICD-10-CM

## 2014-07-18 DIAGNOSIS — J329 Chronic sinusitis, unspecified: Secondary | ICD-10-CM | POA: Diagnosis not present

## 2014-07-18 DIAGNOSIS — I878 Other specified disorders of veins: Secondary | ICD-10-CM | POA: Diagnosis not present

## 2014-07-18 DIAGNOSIS — I10 Essential (primary) hypertension: Secondary | ICD-10-CM | POA: Diagnosis not present

## 2014-07-18 DIAGNOSIS — I340 Nonrheumatic mitral (valve) insufficiency: Secondary | ICD-10-CM | POA: Insufficient documentation

## 2014-07-18 DIAGNOSIS — Z87891 Personal history of nicotine dependence: Secondary | ICD-10-CM | POA: Insufficient documentation

## 2014-07-18 DIAGNOSIS — N189 Chronic kidney disease, unspecified: Secondary | ICD-10-CM | POA: Insufficient documentation

## 2014-07-18 MED ORDER — AMOXICILLIN-POT CLAVULANATE 875-125 MG PO TABS: 1.0000 | ORAL_TABLET | Freq: Two times a day (BID) | ORAL | Status: AC

## 2014-07-18 NOTE — Progress Notes (Signed)
   Subjective:    Patient ID: Mark Leonard, male    DOB: 1950/10/27, 64 y.o.   MRN: [id number]  Otalgia  There is pain in the left ear. This is a new problem. The current episode started in the past 7 days. The problem has been unchanged. There has been no fever. The pain is moderate. Associated symptoms include a sore throat. Associated symptoms comments: Nasal congestion. Treatments tried: OTC sinus medication. The treatment provided no relief.   Patient states that he has left leg swelling and foot numbness for about several weeks.   Sharp pain going down the ear and feels swollen  No drops and no meds  c o pain in the ear  Notes some congrestion and bad headaches  Patient notes left leg swelling particularly left calf. Swelling after be not more all day. Progressed for the last few weeks. Also some discomfort at times in left knee.  No chest pain no shortness of breath no hemoptysis Review of Systems  HENT: Positive for ear pain and sore throat.    No nausea no vomiting ROS otherwise negative    Objective:   Physical Exam  Alert vitals stable HET moderate his congestion tympanic membrane retracted pharynx normal lungs clear heart rare rhythm left knee some swelling left calf mild swelling negative Homans sign negative Tenderness  Skin shows evidence of chronic venous stasis    Assessment & Plan:  Impression 1 rhinosinusitis with otitis media #2 enlarged left leg. May well be due to asymmetric venous stasis however DVT should be a consideration. Patient has also brought up this concern himself feel likely very low risk. More likely venous stasis plan antibiotics prescribed. Ultrasound ordered. Results of ultrasound revealed no DVT however there is a Baker's cyst patient advised WSL

## 2014-08-01 ENCOUNTER — Encounter: Payer: Self-pay | Admitting: Family Medicine

## 2014-08-01 ENCOUNTER — Ambulatory Visit (INDEPENDENT_AMBULATORY_CARE_PROVIDER_SITE_OTHER): Payer: Managed Care, Other (non HMO) | Admitting: Family Medicine

## 2014-08-01 VITALS — BP 130/84 | Ht 67.0 in | Wt 223.4 lb

## 2014-08-01 DIAGNOSIS — M7989 Other specified soft tissue disorders: Secondary | ICD-10-CM

## 2014-08-01 DIAGNOSIS — M25562 Pain in left knee: Secondary | ICD-10-CM | POA: Diagnosis not present

## 2014-08-01 NOTE — Progress Notes (Signed)
   Subjective:    Patient ID: CARVER MURAKAMI, male    DOB: 07-18-1950, 64 y.o.   MRN: [id number]  HPI Patient arrives for a follow up on left leg swelling- patient states it is doing better but still hurts from time to time if doing a lot of standing.  Pain center pretty much around left knee.  Still having swelling in involved way. Next  Painful with protracted standing which occurs at work. 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

## 2014-09-10 ENCOUNTER — Telehealth: Payer: Self-pay | Admitting: Family Medicine

## 2014-09-10 NOTE — Telephone Encounter (Signed)
Discussed with pt. Pt transferred to front to schedule office visit. 

## 2014-09-10 NOTE — Telephone Encounter (Signed)
Ov this wk

## 2014-09-10 NOTE — Telephone Encounter (Signed)
Pt called stating that his bp has been acting up since Thurs or Fri and is wanting to know what he should do about it. This morning it was 180/100 an hour after taking meds.

## 2014-09-10 NOTE — Telephone Encounter (Signed)
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 Taking enalapril 20mg . Last seen 4/27.

## 2014-09-11 ENCOUNTER — Encounter: Payer: Self-pay | Admitting: Family Medicine

## 2014-09-11 ENCOUNTER — Ambulatory Visit (INDEPENDENT_AMBULATORY_CARE_PROVIDER_SITE_OTHER): Payer: Managed Care, Other (non HMO) | Admitting: Family Medicine

## 2014-09-11 VITALS — BP 148/82 | Ht 67.0 in | Wt 229.2 lb

## 2014-09-11 DIAGNOSIS — I10 Essential (primary) hypertension: Secondary | ICD-10-CM

## 2014-09-11 MED ORDER — ENALAPRIL MALEATE 20 MG PO TABS: ORAL_TABLET | ORAL | Status: DC

## 2014-09-11 NOTE — Progress Notes (Signed)
   Subjective:    Patient ID: Mark Leonard, male    DOB: [birth year], 64 y.o.   MRN: [id number]  HPI Patient arrives to discus elevated blood pressure. 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   Pt checkefd bp on his wrist 197 over 100Patient needs a fax sent to DOT doctor stating it has been treated for them to pass his DOT.  188 over 95 originally at [employer] hx of stroke  157 or  95  Numbers sine mid 82s over 70, or 155/90  Review of Systems No vomiting no diarrhea no chest pain some headache diffuse in nature    Objective:   Physical Exam Alert vitals stable blood pressure 154/92 similar on repeat both arms. Lungs clear heart regular in rhythm ankles without edema       Assessment & Plan:  Impression hypertension suboptimal discussed plan medication increased to enalapril 20 twice a day. Recheck in a couple weeks. Symptom care discussed WSL

## 2014-09-24 ENCOUNTER — Ambulatory Visit (INDEPENDENT_AMBULATORY_CARE_PROVIDER_SITE_OTHER): Payer: Managed Care, Other (non HMO) | Admitting: Family Medicine

## 2014-09-24 ENCOUNTER — Encounter: Payer: Self-pay | Admitting: Family Medicine

## 2014-09-24 VITALS — BP 146/92 | Ht 67.0 in | Wt 226.0 lb

## 2014-09-24 DIAGNOSIS — I10 Essential (primary) hypertension: Secondary | ICD-10-CM

## 2014-09-24 MED ORDER — HYDROCHLOROTHIAZIDE 25 MG PO TABS: ORAL_TABLET | ORAL | Status: DC

## 2014-09-24 NOTE — Progress Notes (Signed)
   Subjective:    Patient ID: Mark Leonard, male    DOB: 1951-02-28, 64 y.o.   MRN: [id number]  Hypertension This is a chronic problem. The current episode started more than 1 year ago. The problem has been gradually improving since onset. There are no associated agents to hypertension. There are no known risk factors for coronary artery disease. Treatments tried: enalapril. The current treatment provides moderate improvement. There are no compliance problems.   This visit is a recheck on his elevated blood pressure from 09/11/14.  Patient has no concerns at this time.   Taking new bp meds faithfully  Diff times due to work sched  wals s a lotat work    Review of Systems No headache no chest pain no back pain    Objective:   Physical Exam  Alert vitals stable blood pressure 142/92 on repeat. HEENT normal. Lungs clear. Heart regular in rhythm.      Assessment & Plan:  Impression hypertension improving discussed plan increase blood pressure medicine by adding low-dose hydrochlorothiazide one half 25 daily rationale discussed. Follow-up as scheduled. WSL

## 2014-09-24 NOTE — Patient Instructions (Signed)
Thru car apoth [employer] b p cuffs, would rec definitely a aove the elbow cuff

## 2014-12-06 ENCOUNTER — Encounter: Payer: Self-pay | Admitting: Family Medicine

## 2014-12-06 ENCOUNTER — Ambulatory Visit (INDEPENDENT_AMBULATORY_CARE_PROVIDER_SITE_OTHER): Payer: Managed Care, Other (non HMO) | Admitting: Family Medicine

## 2014-12-06 VITALS — BP 138/96 | Ht 67.0 in | Wt 219.0 lb

## 2014-12-06 DIAGNOSIS — I10 Essential (primary) hypertension: Secondary | ICD-10-CM

## 2014-12-06 DIAGNOSIS — E119 Type 2 diabetes mellitus without complications: Secondary | ICD-10-CM

## 2014-12-06 DIAGNOSIS — Z0000 Encounter for general adult medical examination without abnormal findings: Secondary | ICD-10-CM

## 2014-12-06 LAB — POCT GLYCOSYLATED HEMOGLOBIN (HGB A1C): HEMOGLOBIN A1C: 6.1

## 2014-12-06 MED ORDER — CHLORZOXAZONE 500 MG PO TABS: 500.0000 mg | ORAL_TABLET | Freq: Three times a day (TID) | ORAL | Status: DC | PRN

## 2014-12-06 MED ORDER — FINASTERIDE 5 MG PO TABS: ORAL_TABLET | ORAL | Status: DC

## 2014-12-06 MED ORDER — ENALAPRIL MALEATE 20 MG PO TABS: ORAL_TABLET | ORAL | Status: DC

## 2014-12-06 MED ORDER — HYDROCHLOROTHIAZIDE 25 MG PO TABS: ORAL_TABLET | ORAL | Status: DC

## 2014-12-06 MED ORDER — ESOMEPRAZOLE MAGNESIUM 40 MG PO CPDR: DELAYED_RELEASE_CAPSULE | ORAL | Status: DC

## 2014-12-06 MED ORDER — ALLOPURINOL 300 MG PO TABS: ORAL_TABLET | ORAL | Status: DC

## 2014-12-06 NOTE — Progress Notes (Signed)
   Subjective:    Patient ID: Mark Leonard, male    DOB: 18-Sep-1950, 64 y.o.   MRN: [id number]  HPI The patient comes in today for a wellness visit.  Colon neg in 2010  Heme card for hidden blood   occas forgets bp meds  A review of their health history was completed.  A review of medications was also completed.  Any needed refills; yes - requesting chlorzoxazone for muscle spasm in back. Last filled in 2014.   Eating habits: sometimes healthy not always  Falls/  MVA accidents in past few months: none  Regular exercise: walks 3 -6 miles a day at work  Specialist pt sees on regular basis: none  Preventative health issues were discussed.   Additional concerns: blood pressure- needs letter faxed to urgent care in [place] if bp is within range.   BP was elevated when attempting to get up numbers in better control  Highest numb 117 or so,   Results for orders placed or performed in visit on 12/06/14  POCT HgB A1C  Result Value Ref Range   Hemoglobin A1C 6.1      Review of Systems  Constitutional: Negative for fever, activity change and appetite change.  HENT: Negative for congestion and rhinorrhea.   Eyes: Negative for discharge.  Respiratory: Negative for cough and wheezing.   Cardiovascular: Negative for chest pain.  Gastrointestinal: Negative for vomiting, abdominal pain and blood in stool.  Genitourinary: Negative for frequency and difficulty urinating.  Musculoskeletal: Negative for neck pain.  Skin: Negative for rash.  Allergic/Immunologic: Negative for environmental allergies and food allergies.  Neurological: Negative for weakness and headaches.  Psychiatric/Behavioral: Negative for agitation.  All other systems reviewed and are negative.      Objective:   Physical Exam  Constitutional: He appears well-developed and well-nourished.  Obesity present  HENT:  Head: Normocephalic and atraumatic.  Right Ear: External ear normal.  Left Ear: External  ear normal.  Nose: Nose normal.  Mouth/Throat: Oropharynx is clear and moist.  Eyes: EOM are normal. Pupils are equal, round, and reactive to light.  Neck: Normal range of motion. Neck supple. No thyromegaly present.  Cardiovascular: Normal rate, regular rhythm and normal heart sounds.   No murmur heard. Pulmonary/Chest: Effort normal and breath sounds normal. No respiratory distress. He has no wheezes.  Abdominal: Soft. Bowel sounds are normal. He exhibits no distension and no mass. There is no tenderness.  Genitourinary: Penis normal.  Musculoskeletal: Normal range of motion. He exhibits no edema.  Trace edema ankles  Lymphadenopathy:    He has no cervical adenopathy.  Neurological: He is alert. He exhibits normal muscle tone.  Skin: Skin is warm and dry. No erythema.  Psychiatric: He has a normal mood and affect. His behavior is normal. Judgment normal.  Vitals reviewed.         Assessment & Plan:  Impression #1 wellness exam #2 type 2 diabetes good control. #3 hypertension good control. #4 back strain discussed. Plan patient to get blood work this fall. Diet exercise discussed. Repeat medications. Recheck in 6 months. Continue same dose of prescription medicines. Refill chlorzoxazone. Flu shot at work. WSL

## 2014-12-11 ENCOUNTER — Other Ambulatory Visit: Payer: Self-pay | Admitting: *Deleted

## 2014-12-11 ENCOUNTER — Telehealth: Payer: Self-pay | Admitting: Family Medicine

## 2014-12-11 MED ORDER — ALLOPURINOL 300 MG PO TABS: ORAL_TABLET | ORAL | Status: DC

## 2014-12-11 MED ORDER — FINASTERIDE 5 MG PO TABS: ORAL_TABLET | ORAL | Status: DC

## 2014-12-11 MED ORDER — ESOMEPRAZOLE MAGNESIUM 40 MG PO CPDR: DELAYED_RELEASE_CAPSULE | ORAL | Status: DC

## 2014-12-11 NOTE — Telephone Encounter (Signed)
[facility] delivery says that they did not receive three of the medications that were sent in on 12/06/2014.  Please resend for 90 day supply  allopurinol (ZYLOPRIM) 300 MG tablet  esomeprazole (NEXIUM) 40 MG capsule  finasteride (PROSCAR) 5 MG tablet

## 2014-12-11 NOTE — Telephone Encounter (Signed)
meds sent to pharm. Pt notified.  

## 2015-01-07 ENCOUNTER — Ambulatory Visit (INDEPENDENT_AMBULATORY_CARE_PROVIDER_SITE_OTHER): Payer: Managed Care, Other (non HMO) | Admitting: Family Medicine

## 2015-01-07 ENCOUNTER — Encounter: Payer: Self-pay | Admitting: Family Medicine

## 2015-01-07 VITALS — BP 124/88 | Temp 99.1°F | Ht 67.0 in | Wt 222.2 lb

## 2015-01-07 DIAGNOSIS — J301 Allergic rhinitis due to pollen: Secondary | ICD-10-CM

## 2015-01-07 DIAGNOSIS — J329 Chronic sinusitis, unspecified: Secondary | ICD-10-CM | POA: Diagnosis not present

## 2015-01-07 MED ORDER — AMOXICILLIN-POT CLAVULANATE 875-125 MG PO TABS: 1.0000 | ORAL_TABLET | Freq: Two times a day (BID) | ORAL | Status: DC

## 2015-01-07 NOTE — Progress Notes (Signed)
   Subjective:    Patient ID: Mark Leonard, male    DOB: [birth year], 64 y.o.   MRN: [id number]  Sinusitis This is a new problem. The current episode started in the past 7 days. The problem is unchanged. There has been no fever. The pain is moderate. Associated symptoms include sinus pressure. (Ears stopped up ) Past treatments include oral decongestants. The treatment provided no relief.   Patient states that he has no other concerns at this time.   Generally takes no merds for allergies   Frontal headache and dim energy,  Used nyquil last night   Cheeks most painful  Review of Systems  HENT: Positive for sinus pressure.    patient notes allergies this fall not taking medicine     Objective:   Physical Exam  Alert vitals stable HET moderate his congestion frontal tenderness pharynx erythematous neck supple lungs clear. Heart regular in rhythm.      Assessment & Plan:  Impression post allergy rhinosinusitis plan antibiotics prescribed. Symptom care recommended local measures discussed WSL encouraged to use Claritin also

## 2015-04-11 ENCOUNTER — Encounter: Payer: Self-pay | Admitting: Family Medicine

## 2015-04-11 ENCOUNTER — Ambulatory Visit (INDEPENDENT_AMBULATORY_CARE_PROVIDER_SITE_OTHER): Payer: BLUE CROSS/BLUE SHIELD | Admitting: Family Medicine

## 2015-04-11 VITALS — BP 112/80 | Temp 99.4°F | Ht 67.0 in | Wt 227.0 lb

## 2015-04-11 DIAGNOSIS — J019 Acute sinusitis, unspecified: Secondary | ICD-10-CM | POA: Diagnosis not present

## 2015-04-11 DIAGNOSIS — B348 Other viral infections of unspecified site: Secondary | ICD-10-CM

## 2015-04-11 DIAGNOSIS — J209 Acute bronchitis, unspecified: Secondary | ICD-10-CM

## 2015-04-11 DIAGNOSIS — B338 Other specified viral diseases: Secondary | ICD-10-CM

## 2015-04-11 MED ORDER — AZITHROMYCIN 250 MG PO TABS: ORAL_TABLET | ORAL | Status: DC

## 2015-04-11 NOTE — Progress Notes (Signed)
   Subjective:    Patient ID: Mark Leonard, male    DOB: [birth year], 65 y.o.   MRN: [id number]  Cough This is a new problem. Episode onset: 2 days ago. Associated symptoms include a fever, nasal congestion and rhinorrhea. Pertinent negatives include no chest pain, ear pain or wheezing. Associated symptoms comments: Vomiting, scratchy throat, congestion . Treatments tried: dayquil.    Patient relates some head congestion sinus pressure drainage coughing addition to this relates chills in slight nausea no vomiting no high fevers. Low-grade fever.  Review of Systems  Constitutional: Positive for fever. Negative for activity change.  HENT: Positive for congestion and rhinorrhea. Negative for ear pain.   Eyes: Negative for discharge.  Respiratory: Positive for cough. Negative for wheezing.   Cardiovascular: Negative for chest pain.       Objective:   Physical Exam  Constitutional: He appears well-developed.  HENT:  Head: Normocephalic.  Mouth/Throat: Oropharynx is clear and moist. No oropharyngeal exudate.  Neck: Normal range of motion.  Cardiovascular: Normal rate, regular rhythm and normal heart sounds.   No murmur heard. Pulmonary/Chest: Effort normal and breath sounds normal. He has no wheezes.  Lymphadenopathy:    He has no cervical adenopathy.  Neurological: He exhibits normal muscle tone.  Skin: Skin is warm and dry.  Nursing note and vitals reviewed.         Assessment & Plan:  Patient was seen today for upper respiratory illness. It is felt that the patient is dealing with sinusitis. Antibiotics were prescribed today. Importance of compliance with medication was discussed. Symptoms should gradually resolve over the course of the next several days. If high fevers, progressive illness, difficulty breathing, worsening condition or failure for symptoms to improve over the next several days then the patient is to follow-up. If any emergent conditions the patient is to  follow-up in the emergency department otherwise to follow-up in the office.   I believe this patient is also dealing with parainfluenza I believe that this patient probably had 2-3 more days of low-grade fever chills not feeling well I find no evidence of pneumonia don't recommend x-rays or lab work. I do not feel the patient is septic patient is follow-up if progressive troubles or worse.

## 2015-06-05 ENCOUNTER — Ambulatory Visit: Payer: Managed Care, Other (non HMO) | Admitting: Family Medicine

## 2015-06-20 ENCOUNTER — Encounter: Payer: Self-pay | Admitting: Family Medicine

## 2015-06-20 ENCOUNTER — Ambulatory Visit (INDEPENDENT_AMBULATORY_CARE_PROVIDER_SITE_OTHER): Payer: BLUE CROSS/BLUE SHIELD | Admitting: Family Medicine

## 2015-06-20 VITALS — BP 128/84 | Temp 98.7°F | Ht 67.0 in | Wt 225.0 lb

## 2015-06-20 DIAGNOSIS — E119 Type 2 diabetes mellitus without complications: Secondary | ICD-10-CM

## 2015-06-20 DIAGNOSIS — R7301 Impaired fasting glucose: Secondary | ICD-10-CM

## 2015-06-20 DIAGNOSIS — I10 Essential (primary) hypertension: Secondary | ICD-10-CM | POA: Diagnosis not present

## 2015-06-20 DIAGNOSIS — R079 Chest pain, unspecified: Secondary | ICD-10-CM | POA: Diagnosis not present

## 2015-06-20 LAB — POCT GLYCOSYLATED HEMOGLOBIN (HGB A1C): Hemoglobin A1C: 6.7

## 2015-06-20 MED ORDER — HYDROCHLOROTHIAZIDE 25 MG PO TABS: ORAL_TABLET | ORAL | Status: DC

## 2015-06-20 MED ORDER — ALLOPURINOL 300 MG PO TABS: ORAL_TABLET | ORAL | Status: DC

## 2015-06-20 MED ORDER — GLUCOSE BLOOD VI STRP: ORAL_STRIP | Status: DC

## 2015-06-20 MED ORDER — FINASTERIDE 5 MG PO TABS: ORAL_TABLET | ORAL | Status: DC

## 2015-06-20 MED ORDER — ENALAPRIL MALEATE 20 MG PO TABS: ORAL_TABLET | ORAL | Status: DC

## 2015-06-20 MED ORDER — AMOXICILLIN-POT CLAVULANATE 875-125 MG PO TABS: 1.0000 | ORAL_TABLET | Freq: Two times a day (BID) | ORAL | Status: DC

## 2015-06-20 MED ORDER — ESOMEPRAZOLE MAGNESIUM 40 MG PO CPDR: DELAYED_RELEASE_CAPSULE | ORAL | Status: DC

## 2015-06-20 NOTE — Progress Notes (Signed)
   Subjective:    Patient ID: Mark Leonard, male    DOB: Oct 04, 1950, 65 y.o.   MRN: [id number]  Hypertension This is a chronic problem. The current episode started more than 1 year ago. Treatments tried: enalapril. Compliance problems include diet (walks alot at work, ).   pt brought in bw that was done in October.  Does not miss blood pressure medication. Takes faithfully. Meds reviewed today.   Chest pain on right side, sinus pain, and some nasal congestion and discharge Off and on since end of January. Frontal sinus pain and congestion, pain is sharp, definitely only with deep breath, no sig headaches  Type 2 diabetes. Most sugars running generally good around 120. Pt wants to do A1C at [facility]. He states it will be free if done at lab.   Admits only so-so with exercise. Mostly watching diet .  Review of Systems No headache no abdominal pain no shortness of breath    Objective:   Physical Exam  Alert mild malaise vital stable HET moderate his congestion frontal tenderness nasal neck supple. Lungs clear. Heart regular in rhythm.  EKG right bundle branch block normal sinus rhythm no significant ST-T changes    Assessment & Plan:  Impression 1 chest pain highly likely noncardiac discuss right-sided sharp in nature worse with a deep breath. Ensued after very severe coughing spell month ago. #2 rhinosinusitis discussed #3 type 2 diabetes control good 6.7% A1c discussed #4 hypertension good control plan appropriate medications. Diet exercise discussed. Recheck in 6 months. Wellness plus problem visit then [facility]

## 2015-07-01 ENCOUNTER — Encounter: Payer: Self-pay | Admitting: Family Medicine

## 2015-07-22 ENCOUNTER — Encounter: Payer: Self-pay | Admitting: Family Medicine

## 2015-07-22 ENCOUNTER — Ambulatory Visit (INDEPENDENT_AMBULATORY_CARE_PROVIDER_SITE_OTHER): Payer: BLUE CROSS/BLUE SHIELD | Admitting: Family Medicine

## 2015-07-22 VITALS — BP 120/70 | Temp 99.1°F | Ht 67.0 in | Wt 211.4 lb

## 2015-07-22 DIAGNOSIS — J329 Chronic sinusitis, unspecified: Secondary | ICD-10-CM

## 2015-07-22 DIAGNOSIS — J310 Chronic rhinitis: Secondary | ICD-10-CM

## 2015-07-22 MED ORDER — AMOXICILLIN-POT CLAVULANATE 875-125 MG PO TABS
1.0000 | ORAL_TABLET | Freq: Two times a day (BID) | ORAL | Status: AC
Start: 2015-07-22 — End: 2015-08-05

## 2015-07-22 NOTE — Progress Notes (Signed)
   Subjective:    Patient ID: Mark Leonard, male    DOB: [birth year], 65 y.o.   MRN: [id number]  Sinusitis This is a new problem. The current episode started in the past 7 days. The problem is unchanged. Associated symptoms include congestion, coughing, ear pain, headaches and a sore throat. Past treatments include oral decongestants. The treatment provided no relief.   Patient has no other concerns at this time.   Allergies overall not bad this spring  Temples with pain in the ea  Left sided ear pain, nostly the left  Sharp pain at times   Dayquil  Mom slightly sick on monday Review of Systems  HENT: Positive for congestion, ear pain and sore throat.   Respiratory: Positive for cough.   Neurological: Positive for headaches.       Objective:   Physical Exam Alert, mild malaise. Hydration good Vitals stable. frontal/ maxillary tenderness evident positive nasal congestion. pharynx normal neck supple  lungs clear/no crackles or wheezes. heart regular in rhythm        Assessment & Plan:  Impression rhinosinusitis likely post viral, discussed with patient. plan antibiotics prescribed. Questions answered. Symptomatic care discussed. warning signs discussed. WSL

## 2015-09-02 ENCOUNTER — Other Ambulatory Visit: Payer: Self-pay | Admitting: Family Medicine

## 2015-10-17 ENCOUNTER — Other Ambulatory Visit: Payer: Self-pay | Admitting: Family Medicine

## 2015-11-05 LAB — HM DIABETES EYE EXAM

## 2015-12-06 ENCOUNTER — Other Ambulatory Visit: Payer: Self-pay | Admitting: Family Medicine

## 2015-12-10 ENCOUNTER — Other Ambulatory Visit: Payer: Self-pay | Admitting: *Deleted

## 2015-12-10 ENCOUNTER — Ambulatory Visit (INDEPENDENT_AMBULATORY_CARE_PROVIDER_SITE_OTHER): Payer: BLUE CROSS/BLUE SHIELD | Admitting: Family Medicine

## 2015-12-10 VITALS — BP 120/68 | Ht 67.0 in | Wt 184.6 lb

## 2015-12-10 DIAGNOSIS — Z0000 Encounter for general adult medical examination without abnormal findings: Secondary | ICD-10-CM | POA: Diagnosis not present

## 2015-12-10 DIAGNOSIS — E119 Type 2 diabetes mellitus without complications: Secondary | ICD-10-CM | POA: Diagnosis not present

## 2015-12-10 DIAGNOSIS — I10 Essential (primary) hypertension: Secondary | ICD-10-CM | POA: Diagnosis not present

## 2015-12-10 MED ORDER — ENALAPRIL MALEATE 20 MG PO TABS: 20.0000 mg | ORAL_TABLET | Freq: Every day | ORAL | 5 refills | Status: DC

## 2015-12-10 MED ORDER — HYDROCHLOROTHIAZIDE 25 MG PO TABS: ORAL_TABLET | ORAL | 1 refills | Status: DC

## 2015-12-10 MED ORDER — ESOMEPRAZOLE MAGNESIUM 40 MG PO CPDR: DELAYED_RELEASE_CAPSULE | ORAL | 1 refills | Status: DC

## 2015-12-10 MED ORDER — FINASTERIDE 5 MG PO TABS: ORAL_TABLET | ORAL | 1 refills | Status: DC

## 2015-12-10 MED ORDER — ENALAPRIL MALEATE 20 MG PO TABS: 20.0000 mg | ORAL_TABLET | Freq: Every day | ORAL | 1 refills | Status: DC

## 2015-12-10 NOTE — Progress Notes (Signed)
   Subjective:    Patient ID: Mark Leonard, male    DOB: 1950-04-11, 65 y.o.   MRN: [id number]  HPI  The patient comes in today for a wellness visit.    A review of their health history was completed.  A review of medications was also completed.  Any needed refills;   Eating habits: eating good  Falls/  MVA accidents in past few months:no  Regular exercise: walking  Specialist pt sees on regular basis: no  Preventative health issues were discussed.   Additional concerns: blood pressure running low since losing weight- has cut back to once a day  Results for orders placed or performed in visit on 06/20/15  POCT glycosylated hemoglobin (Hb A1C)  Result Value Ref Range   Hemoglobin A1C 6.7     Pt cking sugars States A1c was 5.6%  BP often in the one teens   Review of Systems  Constitutional: Negative for activity change, appetite change and fever.  HENT: Negative for congestion and rhinorrhea.   Eyes: Negative for discharge.  Respiratory: Negative for cough and wheezing.   Cardiovascular: Negative for chest pain.  Gastrointestinal: Negative for abdominal pain, blood in stool and vomiting.  Genitourinary: Negative for difficulty urinating and frequency.  Musculoskeletal: Negative for neck pain.  Skin: Negative for rash.  Allergic/Immunologic: Negative for environmental allergies and food allergies.  Neurological: Negative for weakness and headaches.  Psychiatric/Behavioral: Negative for agitation.  All other systems reviewed and are negative.      Objective:   Physical Exam  Constitutional: He appears well-developed and well-nourished.  HENT:  Head: Normocephalic and atraumatic.  Right Ear: External ear normal.  Left Ear: External ear normal.  Nose: Nose normal.  Mouth/Throat: Oropharynx is clear and moist.  Eyes: EOM are normal. Pupils are equal, round, and reactive to light.  Neck: Normal range of motion. Neck supple. No thyromegaly present.    Cardiovascular: Normal rate, regular rhythm and normal heart sounds.   No murmur heard. Pulmonary/Chest: Effort normal and breath sounds normal. No respiratory distress. He has no wheezes.  Abdominal: Soft. Bowel sounds are normal. He exhibits no distension and no mass. There is no tenderness.  Genitourinary: Penis normal.  Genitourinary Comments: Prostate within normal limits  Musculoskeletal: Normal range of motion. He exhibits no edema.  Lymphadenopathy:    He has no cervical adenopathy.  Neurological: He is alert. He exhibits normal muscle tone.  Skin: Skin is warm and dry. No erythema.  Psychiatric: He has a normal mood and affect. His behavior is normal. Judgment normal.  Vitals reviewed. Diabetic foot exam within normal limits        Assessment & Plan:  Impression 1 wellness exam 7 years since colonoscopy. Patient encouraged to contact Dr. Charm Rings see if it is time for another one. They have access to pathology #2 type 2 diabetes excellent control discussed A1c very good #3 hypertension controlled to tight now. Decrease enalapril 20 mg once per day. #4 prostate hypertrophy clinically stable plan as noted above. Patient to get blood work through workplace for free air it also flu shot then.

## 2015-12-11 ENCOUNTER — Other Ambulatory Visit: Payer: Self-pay | Admitting: *Deleted

## 2015-12-11 MED ORDER — ENALAPRIL MALEATE 20 MG PO TABS: 20.0000 mg | ORAL_TABLET | Freq: Every day | ORAL | 1 refills | Status: DC

## 2016-02-14 ENCOUNTER — Telehealth: Payer: Self-pay | Admitting: Family Medicine

## 2016-02-14 NOTE — Telephone Encounter (Signed)
Please review blood work results faxed from [employer].

## 2016-02-21 ENCOUNTER — Encounter: Payer: Self-pay | Admitting: Family Medicine

## 2016-02-21 ENCOUNTER — Ambulatory Visit (INDEPENDENT_AMBULATORY_CARE_PROVIDER_SITE_OTHER): Payer: BLUE CROSS/BLUE SHIELD | Admitting: Family Medicine

## 2016-02-21 VITALS — BP 122/72 | Ht 67.0 in | Wt 178.6 lb

## 2016-02-21 DIAGNOSIS — R001 Bradycardia, unspecified: Secondary | ICD-10-CM | POA: Diagnosis not present

## 2016-02-21 NOTE — Progress Notes (Signed)
   Subjective:    Patient ID: Mark Leonard, male    DOB: [birth year], 65 y.o.   MRN: [id number]  HPI  Patient arrives with c/o of dizzy spells for a few weeks. Patient states it happens when he stands up. He checks his blood pressure and it was ok but pulse was 52 and he wonders if that is normal. Pulse today was 65 after walking to room.  Had a spell few wks ago, pt lost balance transiently, fifteen min after taking a shower  Cont to feel unsteady at times when bendoing over  Last wk got up rather quickly, felt light headed and had vision go black and felt like going to pass out  BP decent with h r somewhat ow at 54    Patient compliant blood pressure medication.  Patient worried about his dizziness and associated with low heart rate. Saw heart doctor many years ago who recommended if he gets dizzy with low heart rate he would need to see the heart docs again.  No exertional chest pain.  No true loss of consciousness.  Review of Systems    No headache, no major weight loss or weight gain, no chest pain no back pain abdominal pain no change in bowel habits complete ROS otherwise negative  Objective:   Physical Exam Alert vitals stable, NAD. Blood pressure good on repeat. HEENT normal. Lungs clear. Heart regular rate and rhythm. Blood pressure 132/78 supine 127/76 standing heart rate 54 supine 62 standing   EKG sinus bradycardia partial right bundle branch block    Assessment & Plan:  Impression bradycardia with substantial anxiety on part of patient. #2 hypertension decent control on current meds with no substantial orthostatic changes Plan cardiology referral. Appropriate blood work discussed

## 2016-02-22 LAB — MAGNESIUM: MAGNESIUM: 2.3 mg/dL (ref 1.6–2.3)

## 2016-02-22 LAB — POTASSIUM: POTASSIUM: 5.1 mmol/L (ref 3.5–5.2)

## 2016-02-22 LAB — TSH: TSH: 1.15 u[IU]/mL (ref 0.450–4.500)

## 2016-02-23 ENCOUNTER — Encounter: Payer: Self-pay | Admitting: Family Medicine

## 2016-02-29 ENCOUNTER — Other Ambulatory Visit: Payer: Self-pay | Admitting: Family Medicine

## 2016-03-03 ENCOUNTER — Encounter: Payer: Self-pay | Admitting: Family Medicine

## 2016-03-05 ENCOUNTER — Encounter: Payer: Self-pay | Admitting: Family Medicine

## 2016-04-03 ENCOUNTER — Ambulatory Visit (INDEPENDENT_AMBULATORY_CARE_PROVIDER_SITE_OTHER): Payer: BLUE CROSS/BLUE SHIELD | Admitting: Internal Medicine

## 2016-04-03 ENCOUNTER — Encounter: Payer: Self-pay | Admitting: Internal Medicine

## 2016-04-03 VITALS — BP 122/58 | HR 63 | Ht 67.0 in | Wt 175.0 lb

## 2016-04-03 DIAGNOSIS — R42 Dizziness and giddiness: Secondary | ICD-10-CM | POA: Diagnosis not present

## 2016-04-03 DIAGNOSIS — R001 Bradycardia, unspecified: Secondary | ICD-10-CM

## 2016-04-03 NOTE — Patient Instructions (Signed)
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    Thank you for choosing [facility] !

## 2016-04-03 NOTE — Progress Notes (Signed)
Cardiology Office Note   Date:  04/03/2016   ID:  Mark Leonard, DOB Jul 05, 1950, MRN [id number]  PCP:  Mickie Hillier, MD  Cardiologist:   Dorris Carnes, MD   Patient referred for dizziness, bradycardia      History of Present Illness: Mark Leonard is a 65 y.o. male with a history of dizziness  Lost 52 lbs from march  Watching carbs Just changed BP meds in September  Decreased dose   End of October /early November  Patient  went to put something in garbage can  [place] over  Unsteady  No syncope 3 days later happened again A week later on phone  Drinking coffee  Things went black  No full syncope  Could hear things  Got to living room  BP 118/  P 51   Next week, checking pulse  In 17s  In December a couple dizzy spells  At work  All occurred around stressful situations  No syncope  No SOB  Walks at work Works at [employer] 6 miles per day  Keeping up with activity  Wed of this week  Walking out to check machines  Getting panicky Stress Felt sl dizzy  Stopped  A few seconds felt ok      Current Meds  Medication Sig  . allopurinol (ZYLOPRIM) 300 MG tablet TAKE 1 TABLET DAILY  . chlorzoxazone (PARAFON) 500 MG tablet Take 1 tablet (500 mg total) by mouth 3 (three) times daily as needed.  . Cinnamon 500 MG capsule Take 500 mg by mouth daily.  Marland Kitchen esomeprazole (NEXIUM) 40 MG capsule TAKE ONE CAPSULE BY MOUTH DAILY BEFORE BREAKFAST  . finasteride (PROSCAR) 5 MG tablet TAKE ONE TABLET BY MOUTH ONCE DAILY  . glucose blood (ONE TOUCH ULTRA TEST) test strip USE ONE STRIP TO CHECK GLUCOSE ONCE DAILY  . hydrochlorothiazide (HYDRODIURIL) 25 MG tablet Take one half tablet qd  . metroNIDAZOLE (METROCREAM) 0.75 % cream APPLY CREAM TOPICALLY TWICE A DAY AS NEEDED  . Multiple Vitamin (MULTIVITAMIN) tablet Take 1 tablet by mouth daily.     Allergies:   Tape   Past Medical History:  Diagnosis Date  . Acid reflux   . Allergy   . Bradycardia   . Chronic kidney disease    kidney stones  . Fatty liver   . Gout   . Heart murmur   . Impaired fasting glucose   . Mitral regurgitation   . Prostate hypertrophy   . Venous stasis     Past Surgical History:  Procedure Laterality Date  . CARDIOVASCULAR STRESS TEST    . CHOLECYSTECTOMY    . COLONOSCOPY    . KIDNEY STONE SURGERY    . VASECTOMY       Social History:  The patient  reports that he quit smoking about 17 years ago. He quit after 26.00 years of use. He quit smokeless tobacco use about 17 years ago. He reports that he does not drink alcohol or use drugs.   Family History:  The patient's family history includes Hypertension in his mother; Pulmonary disease in his father.    ROS:  Please see the history of present illness. All other systems are reviewed and  Negative to the above problem except as noted.    PHYSICAL EXAM: VS:  BP (!) 122/58 (BP Location: Right Arm)   Pulse 63   Ht 5\' 7"  (1.702 m)   Wt 175 lb (79.4 kg)   SpO2 96%  BMI 27.41 kg/m   GEN: Well nourished, well developed, in no acute distress  HEENT: normal  Neck: no JVD, carotid bruits, or masses Cardiac: RRR; no murmurs, rubs, or gallops,no edema  Respiratory:  clear to auscultation bilaterally, normal work of breathing GI: soft, nontender, nondistended, + BS  No hepatomegaly  MS: no deformity Moving all extremities   Skin: warm and dry, no rash Neuro:  Strength and sensation are intact Psych: euthymic mood, full affect   EKG:  EKG is not ordered today.  On 11/30  SB 53 bpm  RBBB.     Lipid Panel    Component Value Date/Time   CHOL 69 03/15/2013 0915   TRIG 46 03/15/2013 0915   HDL 35 (L) 03/15/2013 0915   CHOLHDL 2.0 03/15/2013 0915   VLDL 9 03/15/2013 0915   LDLCALC 25 03/15/2013 0915      Wt Readings from Last 3 Encounters:  04/03/16 175 lb (79.4 kg)  02/21/16 178 lb 9.6 oz (81 kg)  12/10/15 184 lb 9.6 oz (83.7 kg)      ASSESSMENT AND PLAN:  1  Dizziness  I am not too concerned about spells  May be  related to wt loss, change in meds  Some of spells appear to be related to stress/ psych.  I do not think they are related to bradycardai  2  Bradycardia  The pt has had a slow HR for years  I am not convinced it is connected to symptoms above  He denies SOB  Encouraged pt to stay hydrated  Stay active  Congratulated on wt loss  F/U as needed    Current medicines are reviewed at length with the patient today.  The patient does not have concerns regarding medicines.  Signed, Dorris Carnes, MD  04/03/2016 1:59 PM    [facility] [address], [place], [place]  [address] Phone: [phone]; Fax: [phone]

## 2016-05-29 ENCOUNTER — Other Ambulatory Visit: Payer: Self-pay | Admitting: Family Medicine

## 2016-06-08 ENCOUNTER — Encounter: Payer: Self-pay | Admitting: Family Medicine

## 2016-06-08 ENCOUNTER — Ambulatory Visit (INDEPENDENT_AMBULATORY_CARE_PROVIDER_SITE_OTHER): Payer: BLUE CROSS/BLUE SHIELD | Admitting: Family Medicine

## 2016-06-08 VITALS — BP 130/78 | Ht 67.0 in | Wt 172.8 lb

## 2016-06-08 DIAGNOSIS — E119 Type 2 diabetes mellitus without complications: Secondary | ICD-10-CM

## 2016-06-08 DIAGNOSIS — M1000 Idiopathic gout, unspecified site: Secondary | ICD-10-CM | POA: Diagnosis not present

## 2016-06-08 DIAGNOSIS — I10 Essential (primary) hypertension: Secondary | ICD-10-CM

## 2016-06-08 LAB — POCT GLYCOSYLATED HEMOGLOBIN (HGB A1C): HEMOGLOBIN A1C: 5.3

## 2016-06-08 MED ORDER — HYDROCHLOROTHIAZIDE 25 MG PO TABS: 12.5000 mg | ORAL_TABLET | Freq: Every day | ORAL | 1 refills | Status: DC

## 2016-06-08 MED ORDER — FINASTERIDE 5 MG PO TABS: 5.0000 mg | ORAL_TABLET | Freq: Every day | ORAL | 1 refills | Status: DC

## 2016-06-08 MED ORDER — ESOMEPRAZOLE MAGNESIUM 40 MG PO CPDR: DELAYED_RELEASE_CAPSULE | ORAL | 1 refills | Status: DC

## 2016-06-08 MED ORDER — ALLOPURINOL 300 MG PO TABS: 300.0000 mg | ORAL_TABLET | Freq: Every day | ORAL | 1 refills | Status: DC

## 2016-06-08 MED ORDER — ENALAPRIL MALEATE 20 MG PO TABS: 20.0000 mg | ORAL_TABLET | Freq: Every day | ORAL | 1 refills | Status: DC

## 2016-06-08 NOTE — Progress Notes (Signed)
   Subjective:    Patient ID: Mark Leonard, male    DOB: 1950-07-08, 67 y.o.   MRN: [id number]  Diabetes  He presents for his follow-up diabetic visit. He has type 2 diabetes mellitus. Risk factors for coronary artery disease include diabetes mellitus and hypertension. Current diabetic treatment includes diet. He is compliant with treatment all of the time. His weight is stable. He is following a diabetic diet. He has not had a previous visit with a dietitian. He does not see a podiatrist.Eye exam is current.   Blood pressure medicine and blood pressure levels reviewed today with patient. Compliant with blood pressure medicine. States does not miss a dose. No obvious side effects. Blood pressure generally good when checked elsewhere. Watching salt intake.   Patient claims compliance with diabetes medication. No obvious side effects. Reports no substantial low sugar spells. Most numbers are generally in good range when checked fasting. Generally does not miss a dose of medication. Watching diabetic diet closely  Results for orders placed or performed in visit on 06/08/16  POCT glycosylated hemoglobin (Hb A1C)  Result Value Ref Range   Hemoglobin A1C 5.3   HM DIABETES EYE EXAM  Result Value Ref Range   HM Diabetic Eye Exam No Retinopathy No Retinopathy   Numbers overall good fting  BPs overall good, No further attacks of gout. Compliant with medication. No obvious side effects.  Overall urinating has improved. Less trips the bathroom at night. Handling medicine well. No obvious side effects.  Staying active a twork, busy,second shitft    Review of Systems No headache, no major weight loss or weight gain, no chest pain no back pain abdominal pain no change in bowel habits complete ROS otherwise negative     Objective:   Physical Exam  Alert vitals stable, NAD. Blood pressure good on repeat. HEENT normal. Lungs clear. Heart regular rate and rhythm. C diabetic foot exam        Assessment & Plan:  Impression 1 hypertension good control discussed maintain same meds #2 type 2 diabetes excellent A1c discussed maintain same approach #3 weight loss intentional by patient patient encouraged #4 gout no recurrence is compliance meds no obvious side effects to maintain #5 prostate hypertrophy with frequent urination and meds well to maintain diet exercise discussed. Meds refilled. Old blood work reviewed recheck in 6 months for wellness plus chronic

## 2016-09-24 ENCOUNTER — Other Ambulatory Visit: Payer: Self-pay | Admitting: Family Medicine

## 2016-10-15 ENCOUNTER — Ambulatory Visit (INDEPENDENT_AMBULATORY_CARE_PROVIDER_SITE_OTHER): Payer: BLUE CROSS/BLUE SHIELD | Admitting: Nurse Practitioner

## 2016-10-15 VITALS — BP 154/82 | Temp 98.1°F | Ht 67.0 in | Wt 170.4 lb

## 2016-10-15 DIAGNOSIS — J0100 Acute maxillary sinusitis, unspecified: Secondary | ICD-10-CM

## 2016-10-15 MED ORDER — AMOXICILLIN-POT CLAVULANATE 875-125 MG PO TABS: 1.0000 | ORAL_TABLET | Freq: Two times a day (BID) | ORAL | 0 refills | Status: DC

## 2016-10-15 NOTE — Patient Instructions (Signed)
Allegra daily Flonase, Nasacort or Rhinocort

## 2016-10-16 ENCOUNTER — Encounter: Payer: Self-pay | Admitting: Nurse Practitioner

## 2016-10-16 NOTE — Progress Notes (Signed)
Subjective:  Presents for complaints of possible ear or sinus infection. Began about 4 days ago. No fever sore throat cough or wheezing. Facial area headache mainly in the maxillary area. Head congestion, nonproductive. Ear pressure. Left ear is stopped up. Very brief periods of mild dizziness has occurred with standing or sitting. States he closes his eyes for a few seconds and resolves. No visual changes. No nausea vomiting. No numbness or weakness of the face arms or legs. No difficulty speaking or swallowing.  Objective:   BP (!) 154/82   Temp 98.1 F (36.7 C) (Oral)   Ht 5\' 7"  (1.702 m)   Wt 170 lb 6 oz (77.3 kg)   BMI 26.68 kg/m  NAD. Alert, oriented. TMs clear effusion, no erythema. Pharynx nonerythematous with green PND noted. Neck supple with mild soft anterior adenopathy. Lungs clear. Heart regular rate rhythm. Gait normal limit.  Assessment:  Acute non-recurrent maxillary sinusitis    Plan:   Meds ordered this encounter  Medications  . amoxicillin-clavulanate (AUGMENTIN) 875-125 MG tablet    Sig: Take 1 tablet by mouth 2 (two) times daily.    Dispense:  20 tablet    Refill:  0    Order Specific Question:   Supervising Provider    Answer:   Mikey Kirschner [[id number]]   Take Allegra daily, add steroid nasal spray to regimen. Warning signs reviewed. Call back early next week if no improvement, sooner if worse.

## 2016-11-16 ENCOUNTER — Telehealth: Payer: Self-pay | Admitting: Family Medicine

## 2016-11-16 NOTE — Telephone Encounter (Signed)
Review lab results faxed from [facility] in results folder.

## 2016-11-17 ENCOUNTER — Other Ambulatory Visit: Payer: Self-pay | Admitting: Family Medicine

## 2016-11-18 ENCOUNTER — Other Ambulatory Visit: Payer: Self-pay | Admitting: Family Medicine

## 2016-12-08 ENCOUNTER — Other Ambulatory Visit: Payer: Self-pay | Admitting: Family Medicine

## 2016-12-08 NOTE — Telephone Encounter (Signed)
Last seen 10/15/16

## 2016-12-10 ENCOUNTER — Encounter: Payer: BLUE CROSS/BLUE SHIELD | Admitting: Family Medicine

## 2016-12-21 ENCOUNTER — Ambulatory Visit (INDEPENDENT_AMBULATORY_CARE_PROVIDER_SITE_OTHER): Payer: BLUE CROSS/BLUE SHIELD | Admitting: Family Medicine

## 2016-12-21 ENCOUNTER — Encounter: Payer: Self-pay | Admitting: Family Medicine

## 2016-12-21 VITALS — BP 126/70 | Ht 67.0 in | Wt 173.0 lb

## 2016-12-21 DIAGNOSIS — I10 Essential (primary) hypertension: Secondary | ICD-10-CM

## 2016-12-21 DIAGNOSIS — Z0000 Encounter for general adult medical examination without abnormal findings: Secondary | ICD-10-CM | POA: Diagnosis not present

## 2016-12-21 DIAGNOSIS — E119 Type 2 diabetes mellitus without complications: Secondary | ICD-10-CM

## 2016-12-21 MED ORDER — ENALAPRIL MALEATE 20 MG PO TABS: 20.0000 mg | ORAL_TABLET | Freq: Every day | ORAL | 1 refills | Status: DC

## 2016-12-21 MED ORDER — FINASTERIDE 5 MG PO TABS
5.0000 mg | ORAL_TABLET | Freq: Every day | ORAL | 1 refills | Status: DC
Start: 2016-12-21 — End: 2017-07-06

## 2016-12-21 MED ORDER — HYDROCHLOROTHIAZIDE 25 MG PO TABS: 12.5000 mg | ORAL_TABLET | Freq: Every day | ORAL | 1 refills | Status: DC

## 2016-12-21 MED ORDER — ESOMEPRAZOLE MAGNESIUM 40 MG PO CPDR: DELAYED_RELEASE_CAPSULE | ORAL | 1 refills | Status: DC

## 2016-12-21 NOTE — Progress Notes (Signed)
   Subjective:    Patient ID: Mark Leonard, male    DOB: 1950/12/02, 66 y.o.   MRN: [id number]  HPI The patient comes in today for a wellness visit.    A review of their health history was completed.  A review of medications was also completed.  Any needed refills; yes  Eating habits: eating healthy  Falls/  MVA accidents in past few months: none  Regular exercise: not as much as he should-walking at work  Specialist pt sees on regular basis: no  Preventative health issues were discussed.   Additional concerns: back spasms have been acting up for last week  Results for orders placed or performed in visit on 06/08/16  POCT glycosylated hemoglobin (Hb A1C)  Result Value Ref Range   Hemoglobin A1C 5.3   HM DIABETES EYE EXAM  Result Value Ref Range   HM Diabetic Eye Exam No Retinopathy No Retinopathy   Had blood work,  Blood pressure medicine and blood pressure levels reviewed today with patient. Compliant with blood pressure medicine. States does not miss a dose. No obvious side effects. Blood pressure generally good when checked elsewhere. Watching salt intake.  bp running good  On proscar for prost and uringaing .  Watching diet reall well  proscar definitely helping with the urinating, drinks a fair amnt of coffee so up a bit more at night  p  Walking a lot at [employer], and doin a lot of yard work    Diet overall really good, has cut down carbs really well  Severe right lumbar, sudden grabbing of the back, worse with crtain motions     Review of Systems     Objective:   Physical Exam        Assessment & Plan:

## 2016-12-21 NOTE — Addendum Note (Signed)
Addended by: Dairl Ponder on: 12/21/2016 09:32 AM   Modules accepted: Orders

## 2016-12-23 ENCOUNTER — Telehealth: Payer: Self-pay | Admitting: Family Medicine

## 2016-12-23 ENCOUNTER — Ambulatory Visit (HOSPITAL_COMMUNITY)
Admission: RE | Admit: 2016-12-23 | Discharge: 2016-12-23 | Disposition: A | Discharge status: Home / Self Care | Payer: BLUE CROSS/BLUE SHIELD | Source: Ambulatory Visit | Attending: Family Medicine | Admitting: Family Medicine

## 2016-12-23 DIAGNOSIS — N200 Calculus of kidney: Secondary | ICD-10-CM | POA: Insufficient documentation

## 2016-12-23 DIAGNOSIS — M5136 Other intervertebral disc degeneration, lumbar region: Secondary | ICD-10-CM | POA: Diagnosis not present

## 2016-12-23 DIAGNOSIS — M545 Low back pain: Secondary | ICD-10-CM | POA: Insufficient documentation

## 2016-12-23 NOTE — Telephone Encounter (Signed)
Spoke with patient and informed her per Dr.Steve Luking- Lumbar xray was ordered. Patient  Verbalized understanding.

## 2016-12-23 NOTE — Telephone Encounter (Signed)
Spoke with patient and informed him per Dr.Steve Luking- Xray usually does not tell us anything about back spasms. If pain is now radiating into the leg we can do a low back xray. Patient verbalized understanding and stated that he would like to do a low back Xray. Would you like for me to go ahead and order Lumbar xray?

## 2016-12-23 NOTE — Telephone Encounter (Signed)
Pt called stating that Mon when he was here he was experiencing back spasms and spoke to Dr. Richardson Landry at his physical about it. Pt states that they have gotten worse and it is affecting his leg. Pt is wanting to know if he can be sent for an xray or something to see what is going on. Please advise.

## 2016-12-23 NOTE — Telephone Encounter (Signed)
Yes lets order

## 2016-12-23 NOTE — Telephone Encounter (Signed)
Generally xray does not tell us a thing about back spasms (tell pt that), but if pain is now radiating into the leg we can do a low bk xray if pt would like

## 2016-12-24 ENCOUNTER — Telehealth: Payer: Self-pay | Admitting: Family Medicine

## 2016-12-24 MED ORDER — NAPROXEN 500 MG PO TABS: ORAL_TABLET | ORAL | 0 refills | Status: DC

## 2016-12-24 NOTE — Addendum Note (Signed)
Addended by: Ofilia Neas R on: 12/24/2016 03:05 PM   Modules accepted: Orders

## 2016-12-24 NOTE — Telephone Encounter (Signed)
Wanting to know if we had gotten the results of the xray he had yesterday?

## 2017-02-16 ENCOUNTER — Other Ambulatory Visit: Payer: Self-pay | Admitting: Family Medicine

## 2017-02-23 DIAGNOSIS — N200 Calculus of kidney: Secondary | ICD-10-CM | POA: Diagnosis not present

## 2017-02-23 DIAGNOSIS — R3911 Hesitancy of micturition: Secondary | ICD-10-CM | POA: Diagnosis not present

## 2017-05-11 ENCOUNTER — Other Ambulatory Visit: Payer: Self-pay | Admitting: Family Medicine

## 2017-06-21 ENCOUNTER — Ambulatory Visit: Payer: BLUE CROSS/BLUE SHIELD | Admitting: Family Medicine

## 2017-06-21 ENCOUNTER — Encounter: Payer: Self-pay | Admitting: Family Medicine

## 2017-06-21 VITALS — BP 140/88 | Ht 67.0 in | Wt 179.0 lb

## 2017-06-21 DIAGNOSIS — S8000XS Contusion of unspecified knee, sequela: Secondary | ICD-10-CM

## 2017-06-21 DIAGNOSIS — E119 Type 2 diabetes mellitus without complications: Secondary | ICD-10-CM

## 2017-06-21 DIAGNOSIS — I10 Essential (primary) hypertension: Secondary | ICD-10-CM | POA: Diagnosis not present

## 2017-06-21 LAB — POCT GLYCOSYLATED HEMOGLOBIN (HGB A1C): HEMOGLOBIN A1C: 4.4

## 2017-06-21 MED ORDER — ESOMEPRAZOLE MAGNESIUM 40 MG PO CPDR: DELAYED_RELEASE_CAPSULE | ORAL | 1 refills | Status: DC

## 2017-06-21 MED ORDER — ENALAPRIL MALEATE 20 MG PO TABS: 20.0000 mg | ORAL_TABLET | Freq: Every day | ORAL | 1 refills | Status: DC

## 2017-06-21 MED ORDER — HYDROCHLOROTHIAZIDE 25 MG PO TABS: 12.5000 mg | ORAL_TABLET | Freq: Every day | ORAL | 1 refills | Status: DC

## 2017-06-21 MED ORDER — GLUCOSE BLOOD VI STRP: ORAL_STRIP | 1 refills | Status: DC

## 2017-06-21 NOTE — Progress Notes (Signed)
   Subjective:    Patient ID: Mark Leonard, male    DOB: Aug 15, 1950, 67 y.o.   MRN: [id number]  HPI Patient is here today to follow up on his chronic health issues. He has a diagnosis of Dm,but does not take any medications for this. He takes enalapril 20 mg one Qhs. He sees [facility] for kidney stones.He eats healthy and walks at work.  Blood pressure medicine and blood pressure levels reviewed today with patient. Compliant with blood pressure medicine. States does not miss a dose. No obvious side effects. Blood pressure generally good when checked elsewhere. Watching salt intake.  Patient claims compliance with diabetes medication. No obvious side effects. Reports no substantial low sugar spells. Most numbers are generally in good range when checked fasting. Generally does not miss a dose of medication. Watching diabetic diet closely  BP usually good  Glu numbers around 102 to 105 or so Testing patient states took a fall.  Struck both knees.  Persistent color changes.  Worried about this.  Also exam little finger.  Thinks he may have broke his finger.  All this occurred 5 months ago  Review of Systems Results for orders placed or performed in visit on 06/21/17  POCT glycosylated hemoglobin (Hb A1C)  Result Value Ref Range   Hemoglobin A1C 4.4   No headache, no major weight loss or weight gain, no chest pain no back pain abdominal pain no change in bowel habits complete ROS otherwise negative      Objective:   Physical Exam  Alert and oriented, vitals reviewed and stable, NAD ENT-TM's and ext canals WNL bilat via otoscopic exam Soft palate, tonsils and post pharynx WNL via oropharyngeal exam Neck-symmetric, no masses; thyroid nonpalpable and nontender Pulmonary-no tachypnea or accessory muscle use; Clear without wheezes via auscultation Card--no abnrml murmurs, rhythm reg and rate WNL Carotid pulses symmetric, without bruits Left little finger slight deformity but very  similar to right little finger baseline both knees positive crepitations slight persistent hemosiderin deposition beneath the skin of both knees anterior patellar region      Assessment & Plan:  Impression 1 type 2 diabetes good control discussed maintain same approach A1c excellent  2.  Knee injury.  Discussed.  Residual skin changes fingers really appear normal to me discussed  3.  Hypertension good control discussed maintain same diet exercise discussed compliance discussed follow-up in 6 months for wellness plus chronic

## 2017-06-29 ENCOUNTER — Telehealth: Payer: Self-pay | Admitting: Family Medicine

## 2017-06-29 ENCOUNTER — Other Ambulatory Visit: Payer: Self-pay | Admitting: *Deleted

## 2017-06-29 MED ORDER — BLOOD GLUCOSE MONITOR KIT: PACK | 5 refills | Status: DC

## 2017-06-29 NOTE — Telephone Encounter (Signed)
Patient states he will need a new meter Accu check meter and test strips. He states he checks it once per week,but last A1c 4.4 per pt and he is wondering if he needs to be checking them at all. Please advise.

## 2017-06-29 NOTE — Telephone Encounter (Signed)
Go ahead an get one covered and ck onece every couple weeks and record

## 2017-06-29 NOTE — Telephone Encounter (Signed)
Patient said that [facility] is requesting for our office to send a new Rx for an Accucheck meter and strips because they are no longer covering his current brand. Please advise.

## 2017-06-29 NOTE — Telephone Encounter (Signed)
Discussed with pt and rx sent to pharm.

## 2017-07-06 ENCOUNTER — Ambulatory Visit: Payer: BLUE CROSS/BLUE SHIELD | Admitting: Nurse Practitioner

## 2017-07-06 ENCOUNTER — Other Ambulatory Visit: Payer: Self-pay

## 2017-07-06 ENCOUNTER — Encounter: Payer: Self-pay | Admitting: Nurse Practitioner

## 2017-07-06 VITALS — BP 118/74 | Temp 97.6°F | Ht 67.0 in | Wt 181.0 lb

## 2017-07-06 DIAGNOSIS — B9689 Other specified bacterial agents as the cause of diseases classified elsewhere: Secondary | ICD-10-CM | POA: Diagnosis not present

## 2017-07-06 DIAGNOSIS — J019 Acute sinusitis, unspecified: Secondary | ICD-10-CM

## 2017-07-06 MED ORDER — AMOXICILLIN-POT CLAVULANATE 875-125 MG PO TABS
1.0000 | ORAL_TABLET | Freq: Two times a day (BID) | ORAL | 0 refills | Status: DC
Start: 2017-07-06 — End: 2018-06-21

## 2017-07-06 MED ORDER — BLOOD GLUCOSE MONITOR KIT: PACK | 5 refills | Status: DC

## 2017-07-06 MED ORDER — FINASTERIDE 5 MG PO TABS: 5.0000 mg | ORAL_TABLET | Freq: Every day | ORAL | 1 refills | Status: DC

## 2017-07-06 MED ORDER — BLOOD GLUCOSE MONITOR KIT: PACK | 5 refills | Status: AC

## 2017-07-06 NOTE — Progress Notes (Signed)
Subjective:  Presents for c/o sinus problems for the past several days. No fever or sore throat. Mild hoarseness. Facial area headache. Runny nose. No cough or wheezing. Occasional sharp pain in each ear.   Objective:   BP 118/74   Temp 97.6 F (36.4 C) (Oral)   Ht _0  (1.702 m)   Wt 181 lb (82.1 kg)   BMI 28.35 kg/m  NAD. Alert, oriented. TMs retracted, clear effusion. Pharynx injected with green PND noted. Neck supple with mild anterior adenopathy. Lungs clear. Heart RRR.   Assessment:  Acute bacterial rhinosinusitis    Plan:   Meds ordered this encounter  Medications  . finasteride (PROSCAR) 5 MG tablet    Sig: Take 1 tablet (5 mg total) by mouth daily.    Dispense:  90 tablet    Refill:  1  . DISCONTD: blood glucose meter kit and supplies KIT    Sig: Dispense based on patient and insurance preference. Use daily as directed. (FOR ICD-10 code E11.9)    Dispense:  1 each    Refill:  5    Pt requested Accu check    Order Specific Question:   Number of strips    Answer:   100    Order Specific Question:   Number of lancets    Answer:   100  . amoxicillin-clavulanate (AUGMENTIN) 875-125 MG tablet    Sig: Take 1 tablet by mouth 2 (two) times daily.    Dispense:  20 tablet    Refill:  0    Order Specific Question:   Supervising Provider    Answer:   Mikey Kirschner [[id number]]   OTC meds as directed. Call back if worsens or persists.

## 2017-07-06 NOTE — Patient Instructions (Signed)
-

## 2017-11-01 ENCOUNTER — Other Ambulatory Visit: Payer: Self-pay | Admitting: Family Medicine

## 2017-12-22 ENCOUNTER — Encounter: Payer: Self-pay | Admitting: Family Medicine

## 2017-12-22 ENCOUNTER — Ambulatory Visit (INDEPENDENT_AMBULATORY_CARE_PROVIDER_SITE_OTHER): Payer: BLUE CROSS/BLUE SHIELD | Admitting: Family Medicine

## 2017-12-22 VITALS — BP 132/90 | Ht 67.0 in | Wt 183.8 lb

## 2017-12-22 DIAGNOSIS — Z0000 Encounter for general adult medical examination without abnormal findings: Secondary | ICD-10-CM | POA: Diagnosis not present

## 2017-12-22 DIAGNOSIS — Z125 Encounter for screening for malignant neoplasm of prostate: Secondary | ICD-10-CM | POA: Diagnosis not present

## 2017-12-22 DIAGNOSIS — E119 Type 2 diabetes mellitus without complications: Secondary | ICD-10-CM | POA: Diagnosis not present

## 2017-12-22 DIAGNOSIS — Z23 Encounter for immunization: Secondary | ICD-10-CM

## 2017-12-22 DIAGNOSIS — I10 Essential (primary) hypertension: Secondary | ICD-10-CM | POA: Diagnosis not present

## 2017-12-22 MED ORDER — ZOSTER VAC RECOMB ADJUVANTED 50 MCG/0.5ML IM SUSR: 0.5000 mL | Freq: Once | INTRAMUSCULAR | 1 refills | Status: AC

## 2017-12-22 MED ORDER — HYDROCHLOROTHIAZIDE 25 MG PO TABS: 12.5000 mg | ORAL_TABLET | Freq: Every day | ORAL | 1 refills | Status: DC

## 2017-12-22 MED ORDER — FINASTERIDE 5 MG PO TABS: 5.0000 mg | ORAL_TABLET | Freq: Every day | ORAL | 1 refills | Status: DC

## 2017-12-22 MED ORDER — ESOMEPRAZOLE MAGNESIUM 40 MG PO CPDR: DELAYED_RELEASE_CAPSULE | ORAL | 1 refills | Status: DC

## 2017-12-22 MED ORDER — ENALAPRIL MALEATE 20 MG PO TABS: 20.0000 mg | ORAL_TABLET | Freq: Every day | ORAL | 1 refills | Status: DC

## 2017-12-22 MED ORDER — ALLOPURINOL 300 MG PO TABS: 300.0000 mg | ORAL_TABLET | Freq: Every day | ORAL | 1 refills | Status: DC

## 2017-12-22 NOTE — Progress Notes (Signed)
Subjective:    Patient ID: Mark Leonard, male    DOB: 15-Sep-1950, 67 y.o.   MRN: [id number] The patient comes in today for a wellness visit.    A review of their health history was completed.  A review of medications was also completed.  Any needed refills; yes; all of chronic med  Eating habits: health conscious  Falls/  MVA accidents in past few months: none  Regular exercise: walks at work   Specialist pt sees on regular basis: no  Preventative health issues were discussed.   Additional concerns: flu shot; had first shingles shot but has questions about the newest Shingrix  Diabetes  He presents for his follow-up diabetic visit. He has type 2 diabetes mellitus. There are no hypoglycemic associated symptoms. Pertinent negatives for hypoglycemia include no headaches. There are no diabetic associated symptoms. Pertinent negatives for diabetes include no chest pain and no weakness. There are no hypoglycemic complications. There are no diabetic complications. He does not see a podiatrist.Eye exam is current.  PT states he checks blood sugar once a week and the highest it has been was 120.   Patient claims compliance with diabetes medication. No obvious side effects. Reports no substantial low sugar spells. Most numbers are generally in good range when checked fasting. Generally does not miss a dose of medication. Watching diabetic diet closely  Results for orders placed or performed in visit on 06/21/17  POCT glycosylated hemoglobin (Hb A1C)  Result Value Ref Range   Hemoglobin A1C 4.4    Morn sugars around 90 geenrally goo dtight control  Blood pressure medicine and blood pressure levels reviewed today with patient. Compliant with blood pressure medicine. States does not miss a dose. No obvious side effects. Blood pressure generally good when checked elsewhere. Watching salt intake.  Staying active [place] but not exercising   Neg eye exm dec no dia damage   No  rcurrence of gout       Review of Systems  Constitutional: Negative for activity change, appetite change and fever.  HENT: Negative for congestion and rhinorrhea.   Eyes: Negative for discharge.  Respiratory: Negative for cough and wheezing.   Cardiovascular: Negative for chest pain.  Gastrointestinal: Negative for abdominal pain, blood in stool and vomiting.  Genitourinary: Negative for difficulty urinating and frequency.  Musculoskeletal: Negative for neck pain.  Skin: Negative for rash.  Allergic/Immunologic: Negative for environmental allergies and food allergies.  Neurological: Negative for weakness and headaches.  Psychiatric/Behavioral: Negative for agitation.  All other systems reviewed and are negative.      Objective:   Physical Exam  Constitutional: He appears well-developed and well-nourished.  HENT:  Head: Normocephalic and atraumatic.  Right Ear: External ear normal.  Left Ear: External ear normal.  Nose: Nose normal.  Mouth/Throat: Oropharynx is clear and moist.  Eyes: Pupils are equal, round, and reactive to light. EOM are normal.  Neck: Normal range of motion. Neck supple. No thyromegaly present.  Cardiovascular: Normal rate, regular rhythm and normal heart sounds.  No murmur heard. Pulmonary/Chest: Effort normal and breath sounds normal. No respiratory distress. He has no wheezes.  Abdominal: Soft. Bowel sounds are normal. He exhibits no distension and no mass. There is no tenderness.  Genitourinary: Prostate normal and penis normal.  Musculoskeletal: Normal range of motion. He exhibits no edema.  Lymphadenopathy:    He has no cervical adenopathy.  Neurological: He is alert. He exhibits normal muscle tone.  Skin: Skin is warm and dry.  No erythema.  Psychiatric: He has a normal mood and affect. His behavior is normal. Judgment normal.  Vitals reviewed.         Assessment & Plan:  Impression wellness exam.  Diet discussed.  Exercise discussed.   Vaccines discussed and administered.  2.  Hypertension.  Good control discussed to maintain same meds her graph #3 type 2 diabetes.  Excellent control discussed approach yearly eye visits encouraged  Need to check PSA because patient's workplace does not check this.  Flu shot today.  Diet exercise discussed  Shingrix vaccine recommended

## 2017-12-23 LAB — PSA: PROSTATE SPECIFIC AG, SERUM: 1.2 ng/mL (ref 0.0–4.0)

## 2017-12-26 ENCOUNTER — Encounter: Payer: Self-pay | Admitting: Family Medicine

## 2018-02-14 ENCOUNTER — Other Ambulatory Visit: Payer: Self-pay | Admitting: Family Medicine

## 2018-03-10 DIAGNOSIS — J329 Chronic sinusitis, unspecified: Secondary | ICD-10-CM | POA: Diagnosis not present

## 2018-03-10 DIAGNOSIS — J069 Acute upper respiratory infection, unspecified: Secondary | ICD-10-CM | POA: Diagnosis not present

## 2018-04-12 ENCOUNTER — Ambulatory Visit: Payer: BLUE CROSS/BLUE SHIELD | Admitting: Family Medicine

## 2018-04-12 ENCOUNTER — Encounter: Payer: Self-pay | Admitting: Family Medicine

## 2018-04-12 VITALS — BP 132/82 | Temp 98.0°F | Wt 190.6 lb

## 2018-04-12 DIAGNOSIS — Z23 Encounter for immunization: Secondary | ICD-10-CM

## 2018-04-12 DIAGNOSIS — J329 Chronic sinusitis, unspecified: Secondary | ICD-10-CM | POA: Diagnosis not present

## 2018-04-12 DIAGNOSIS — J310 Chronic rhinitis: Secondary | ICD-10-CM

## 2018-04-12 MED ORDER — CEFDINIR 300 MG PO CAPS: ORAL_CAPSULE | ORAL | 0 refills | Status: DC

## 2018-04-12 NOTE — Progress Notes (Signed)
   Subjective:    Patient ID: Mark Leonard, male    DOB: 05-26-1950, 68 y.o.   MRN: [id number]  Cough  This is a new (Had cough on Dec 5; pt went to Urgent Care and was better but cough has come back) problem. The current episode started in the past 7 days. The cough is productive of sputum. Associated symptoms include ear pain. Associated symptoms comments: Scratchy throat. Treatments tried: Nyquil at night time; lemon juice,vingegar and honey mixture. The treatment provided mild relief.   Took high dos e amoxacillin, cleared up all the infxn  wnt obac to work last thur  By sat had more sig dranage   Now noting pain in the ears   No fver   No ch  oain   Sisters family was very sick the week prior  Did get the flu shot    Green and blod y disch arnd  Noting some h a     Review of Systems  HENT: Positive for ear pain.   Respiratory: Positive for cough.        Objective:   Physical Exam  Alert, mild malaise. Hydration good Vitals stable. frontal/ maxillary tenderness evident positive nasal congestion. pharynx normal neck supple  lungs clear/no crackles or wheezes. heart regular in rhythm       Assessment & Plan:  Impression rhinosinusitis likely post viral, discussed with patient. plan antibiotics prescribed. Questions answered. Symptomatic care discussed. warning signs discussed. WSL

## 2018-04-18 ENCOUNTER — Other Ambulatory Visit: Payer: Self-pay

## 2018-04-18 MED ORDER — ENALAPRIL MALEATE 20 MG PO TABS: 20.0000 mg | ORAL_TABLET | Freq: Every day | ORAL | 1 refills | Status: DC

## 2018-04-20 ENCOUNTER — Other Ambulatory Visit: Payer: Self-pay | Admitting: Family Medicine

## 2018-05-30 DIAGNOSIS — L280 Lichen simplex chronicus: Secondary | ICD-10-CM | POA: Diagnosis not present

## 2018-05-30 DIAGNOSIS — L719 Rosacea, unspecified: Secondary | ICD-10-CM | POA: Diagnosis not present

## 2018-05-30 DIAGNOSIS — L570 Actinic keratosis: Secondary | ICD-10-CM | POA: Diagnosis not present

## 2018-06-20 DIAGNOSIS — L719 Rosacea, unspecified: Secondary | ICD-10-CM | POA: Diagnosis not present

## 2018-06-21 ENCOUNTER — Encounter: Payer: Self-pay | Admitting: Family Medicine

## 2018-06-21 ENCOUNTER — Ambulatory Visit: Payer: BLUE CROSS/BLUE SHIELD | Admitting: Family Medicine

## 2018-06-21 ENCOUNTER — Other Ambulatory Visit: Payer: Self-pay

## 2018-06-21 VITALS — BP 136/84 | Ht 67.0 in | Wt 193.0 lb

## 2018-06-21 DIAGNOSIS — E119 Type 2 diabetes mellitus without complications: Secondary | ICD-10-CM

## 2018-06-21 DIAGNOSIS — I10 Essential (primary) hypertension: Secondary | ICD-10-CM

## 2018-06-21 DIAGNOSIS — J0100 Acute maxillary sinusitis, unspecified: Secondary | ICD-10-CM

## 2018-06-21 LAB — POCT GLYCOSYLATED HEMOGLOBIN (HGB A1C): Hemoglobin A1C: 5.1 % (ref 4.0–5.6)

## 2018-06-21 MED ORDER — ENALAPRIL MALEATE 20 MG PO TABS: 20.0000 mg | ORAL_TABLET | Freq: Every day | ORAL | 1 refills | Status: DC

## 2018-06-21 MED ORDER — FINASTERIDE 5 MG PO TABS: 5.0000 mg | ORAL_TABLET | Freq: Every day | ORAL | 1 refills | Status: DC

## 2018-06-21 MED ORDER — HYDROCHLOROTHIAZIDE 25 MG PO TABS: 12.5000 mg | ORAL_TABLET | Freq: Every day | ORAL | 1 refills | Status: DC

## 2018-06-21 MED ORDER — ALLOPURINOL 300 MG PO TABS: 300.0000 mg | ORAL_TABLET | Freq: Every day | ORAL | 3 refills | Status: DC

## 2018-06-21 MED ORDER — ESOMEPRAZOLE MAGNESIUM 40 MG PO CPDR: DELAYED_RELEASE_CAPSULE | ORAL | 1 refills | Status: DC

## 2018-06-21 NOTE — Progress Notes (Signed)
  Subjective:     Patient ID: Mark Leonard, male   DOB: [birth year], 68 y.o.   MRN: [id number]  HPI   Review of Systems     Objective:   Physical Exam     Assessment:         Plan:

## 2018-06-21 NOTE — Progress Notes (Signed)
   Subjective:    Patient ID: Mark Leonard, male    DOB: 1951-02-14, 68 y.o.   MRN: [id number]  Diabetes  He presents for his follow-up diabetic visit. He has type 2 diabetes mellitus. Current diabetic treatment includes diet. Exercise: walking at work. Eye exam current: going next week.   Having pain in right hip and back. Has had xrays in the past but getting worse. Right foot has been going numb for the past few months.   Results for orders placed or performed in visit on 06/21/18  POCT glycosylated hemoglobin (Hb A1C)  Result Value Ref Range   Hemoglobin A1C 5.1 4.0 - 5.6 %   HbA1c POC (<> result, manual entry)     HbA1c, POC (prediabetic range)     HbA1c, POC (controlled diabetic range)     Blood pressure medicine and blood pressure levels reviewed today with patient. Compliant with blood pressure medicine. States does not miss a dose. No obvious side effects. Blood pressure generally good when checked elsewhere. Watching salt intake.            Review of Systems No headache, no major weight loss or weight gain, no chest pain no back pain abdominal pain no change in bowel habits complete ROS otherwise negative     Objective:   Physical Exam Alert and oriented, vitals reviewed and stable, NAD ENT-TM's and ext canals WNL bilat via otoscopic exam Soft palate, tonsils and post pharynx WNL via oropharyngeal exam Neck-symmetric, no masses; thyroid nonpalpable and nontender Pulmonary-no tachypnea or accessory muscle use; Clear without wheezes via auscultation Card--no abnrml murmurs, rhythm reg and rate WNL Carotid pulses symmetric, without bruits  Right hip positive pain with rotational movements      Assessment & Plan:  Impression 1 type 2 diabetes.  Control good discussed to maintain same approach  2.  Hypertension good control discussed maintain same  3.  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

## 2018-07-09 ENCOUNTER — Other Ambulatory Visit: Payer: Self-pay | Admitting: Family Medicine

## 2018-08-30 ENCOUNTER — Telehealth: Payer: Self-pay | Admitting: Family Medicine

## 2018-08-30 NOTE — Telephone Encounter (Signed)
plz do, ck glu fasting threee times per week but write rx for daily cks

## 2018-08-30 NOTE — Telephone Encounter (Signed)
Pt changed insurance & needs 90 supply of each Rx sent to new mail order pharmacy  allopurinol (ZYLOPRIM) 300 MG tablet  enalapril (VASOTEC) 20 MG tablet  esomeprazole (NEXIUM) 40 MG capsule   finasteride (PROSCAR) 5 MG tablet  hydrochlorothiazide (HYDRODIURIL) 25 MG tablet  Pt's new mail order pharmacy is [facility]  Also if pt needs to continue to check his blood sugar, he will need Rx for new meter & test strips as his new insurance does not cover the meter & strips he currently has  Please advise & call pt when done

## 2018-08-30 NOTE — Telephone Encounter (Signed)
Does patient need to continue sugar checks and how often is needed- see message below

## 2018-08-31 MED ORDER — ESOMEPRAZOLE MAGNESIUM 40 MG PO CPDR: DELAYED_RELEASE_CAPSULE | ORAL | 1 refills | Status: DC

## 2018-08-31 MED ORDER — FINASTERIDE 5 MG PO TABS: 5.0000 mg | ORAL_TABLET | Freq: Every day | ORAL | 1 refills | Status: DC

## 2018-08-31 MED ORDER — HYDROCHLOROTHIAZIDE 25 MG PO TABS: 12.5000 mg | ORAL_TABLET | Freq: Every day | ORAL | 1 refills | Status: DC

## 2018-08-31 MED ORDER — ALLOPURINOL 300 MG PO TABS: 300.0000 mg | ORAL_TABLET | Freq: Every day | ORAL | 1 refills | Status: DC

## 2018-08-31 MED ORDER — ENALAPRIL MALEATE 20 MG PO TABS: 20.0000 mg | ORAL_TABLET | Freq: Every day | ORAL | 1 refills | Status: DC

## 2018-08-31 NOTE — Telephone Encounter (Signed)
Patient is aware all medication sent in to the new drug store.

## 2018-09-02 ENCOUNTER — Ambulatory Visit (INDEPENDENT_AMBULATORY_CARE_PROVIDER_SITE_OTHER): Payer: PPO | Admitting: Family Medicine

## 2018-09-02 ENCOUNTER — Other Ambulatory Visit: Payer: Self-pay

## 2018-09-02 DIAGNOSIS — E119 Type 2 diabetes mellitus without complications: Secondary | ICD-10-CM | POA: Diagnosis not present

## 2018-09-02 DIAGNOSIS — J019 Acute sinusitis, unspecified: Secondary | ICD-10-CM

## 2018-09-02 MED ORDER — FLUTICASONE PROPIONATE 50 MCG/ACT NA SUSP: 2.0000 | Freq: Every day | NASAL | 5 refills | Status: DC

## 2018-09-02 MED ORDER — AMOXICILLIN 500 MG PO TABS: 500.0000 mg | ORAL_TABLET | Freq: Three times a day (TID) | ORAL | 0 refills | Status: DC

## 2018-09-02 NOTE — Progress Notes (Signed)
   Subjective:    Patient ID: GENERAL WEARING, male    DOB: December 06, 1950, 68 y.o.   MRN: [id number] Very nice gentleman unable to do video visit doing phone visit HPI Patient relates a lot of head congestion drainage sinus pressure pain discomfort and ear pressure denies severe sore throat denies high fever chills denies muscle aches denies coughing wheezing difficulty breathing or flulike illness PMH benign Patient calls with congestion and ringing in his ears for a few days. Patient has also started getting sinus pressure yesterday  Virtual Visit via Video Note  I connected with Skipper Cliche on 09/02/18 at  3:30 PM EDT by a video enabled telemedicine application and verified that I am speaking with the correct person using two identifiers.  Location: Patient: home Provider: office   I discussed the limitations of evaluation and management by telemedicine and the availability of in person appointments. The patient expressed understanding and agreed to proceed.  History of Present Illness:    Observations/Objective:   Assessment and Plan:   Follow Up Instructions:    I discussed the assessment and treatment plan with the patient. The patient was provided an opportunity to ask questions and all were answered. The patient agreed with the plan and demonstrated an understanding of the instructions.   The patient was advised to call back or seek an in-person evaluation if the symptoms worsen or if the condition fails to improve as anticipated.  I provided 15 minutes of non-face-to-face time during this encounter.      Review of Systems  Constitutional: Negative for activity change, chills, fatigue and fever.  HENT: Positive for congestion and rhinorrhea. Negative for ear pain.   Eyes: Negative for discharge.  Respiratory: Negative for cough and wheezing.   Cardiovascular: Negative for chest pain.  Gastrointestinal: Negative for nausea and vomiting.  Musculoskeletal: Negative  for arthralgias.       Objective:   Physical Exam Today's visit was via telephone Physical exam was not possible for this visit        Assessment & Plan:  Sinus pressure pain discomfort along with ear pain antibiotics recommended as well as OTC allergy medicines and decongestant/allergy nasal spray Flonase for the next few weeks  Diabetes needs a prescription for a new meter this was sent to his pharmacy [facility] patient to keep all regular follow-up visits with Dr. Richardson Landry regarding diabetes

## 2018-09-30 DIAGNOSIS — H524 Presbyopia: Secondary | ICD-10-CM | POA: Diagnosis not present

## 2018-11-21 DIAGNOSIS — L570 Actinic keratosis: Secondary | ICD-10-CM | POA: Diagnosis not present

## 2018-11-21 DIAGNOSIS — L719 Rosacea, unspecified: Secondary | ICD-10-CM | POA: Diagnosis not present

## 2018-11-21 DIAGNOSIS — L280 Lichen simplex chronicus: Secondary | ICD-10-CM | POA: Diagnosis not present

## 2018-12-23 ENCOUNTER — Ambulatory Visit (INDEPENDENT_AMBULATORY_CARE_PROVIDER_SITE_OTHER): Payer: PPO | Admitting: Family Medicine

## 2018-12-23 DIAGNOSIS — Z125 Encounter for screening for malignant neoplasm of prostate: Secondary | ICD-10-CM | POA: Diagnosis not present

## 2018-12-23 DIAGNOSIS — I10 Essential (primary) hypertension: Secondary | ICD-10-CM

## 2018-12-23 DIAGNOSIS — J3089 Other allergic rhinitis: Secondary | ICD-10-CM

## 2018-12-23 DIAGNOSIS — E119 Type 2 diabetes mellitus without complications: Secondary | ICD-10-CM | POA: Diagnosis not present

## 2018-12-23 MED ORDER — FINASTERIDE 5 MG PO TABS: 5.0000 mg | ORAL_TABLET | Freq: Every day | ORAL | 1 refills | Status: DC

## 2018-12-23 MED ORDER — ENALAPRIL MALEATE 20 MG PO TABS: 20.0000 mg | ORAL_TABLET | Freq: Every day | ORAL | 1 refills | Status: DC

## 2018-12-23 MED ORDER — HYDROCHLOROTHIAZIDE 25 MG PO TABS: 12.5000 mg | ORAL_TABLET | Freq: Every day | ORAL | 1 refills | Status: DC

## 2018-12-23 MED ORDER — ESOMEPRAZOLE MAGNESIUM 40 MG PO CPDR: DELAYED_RELEASE_CAPSULE | ORAL | 1 refills | Status: DC

## 2018-12-23 NOTE — Progress Notes (Signed)
   Subjective:  Audio plus video plus multiple health concerns  Patient ID: Mark Leonard, male    DOB: [birth year], 68 y.o.   MRN: [id number]  Diabetes He presents for his follow-up diabetic visit. He has type 2 diabetes mellitus. Risk factors for coronary artery disease include diabetes mellitus, dyslipidemia and hypertension. Current diabetic treatment includes diet. He is compliant with treatment all of the time. His weight is stable. He is following a diabetic diet.    Congestion, ears stopped up/allergy/sinus problems.  See below  Review of Systems Virtual Visit via Video Note  I connected with Mark Leonard on 12/23/18 at 10:30 AM EDT by a video enabled telemedicine application and verified that I am speaking with the correct person using two identifiers.  Location: Patient: home Provider: office   I discussed the limitations of evaluation and management by telemedicine and the availability of in person appointments. The patient expressed understanding and agreed to proceed.  History of Present Illness:    Observations/Objective:   Assessment and Plan:   Follow Up Instructions:    I discussed the assessment and treatment plan with the patient. The patient was provided an opportunity to ask questions and all were answered. The patient agreed with the plan and demonstrated an understanding of the instructions.   The patient was advised to call back or seek an in-person evaluation if the symptoms worsen or if the condition fails to improve as anticipated.  I provided 25 minutes of non-face-to-face time during this encounter.   Blood pressure medicine and blood pressure levels reviewed today with patient. Compliant with blood pressure medicine. States does not miss a dose. No obvious side effects. Blood pressure generally good when checked elsewhere. Watching salt intake.  Patient notes progressive congestion.  Stuffiness.  Some drainage and tickle.  No headache no  chest pain no cough no fever     Objective:   Physical Exam  Virtual      Assessment & Plan:  Impression type 2 diabetes.  Discussed.  Await A1c results.  Compliance with diet discussed  2.  Hypertension.  Blood pressure good control when last checked.  To maintain same therapy rationale discussed  3.  Congestion/drainage.  Likely allergic rhinitis discussed  Appropriate blood work.  Medications refilled diet exercise discussed.  Follow-up for physical as scheduled.  Flu shot 10

## 2018-12-24 ENCOUNTER — Encounter: Payer: Self-pay | Admitting: Family Medicine

## 2019-01-10 DIAGNOSIS — Z125 Encounter for screening for malignant neoplasm of prostate: Secondary | ICD-10-CM | POA: Diagnosis not present

## 2019-01-10 DIAGNOSIS — I10 Essential (primary) hypertension: Secondary | ICD-10-CM | POA: Diagnosis not present

## 2019-01-10 DIAGNOSIS — E119 Type 2 diabetes mellitus without complications: Secondary | ICD-10-CM | POA: Diagnosis not present

## 2019-01-11 LAB — LIPID PANEL
Chol/HDL Ratio: 2.5 ratio (ref 0.0–5.0)
Cholesterol, Total: 93 mg/dL — ABNORMAL LOW (ref 100–199)
HDL: 37 mg/dL — ABNORMAL LOW (ref >39)
LDL Chol Calc (NIH): 35 mg/dL (ref 0–99)
Triglycerides: 115 mg/dL (ref 0–149)
VLDL Cholesterol Cal: 21 mg/dL (ref 5–40)

## 2019-01-11 LAB — HEPATIC FUNCTION PANEL
ALT: 53 IU/L — ABNORMAL HIGH (ref 0–44)
AST: 44 IU/L — ABNORMAL HIGH (ref 0–40)
Albumin: 4.3 g/dL (ref 3.8–4.8)
Alkaline Phosphatase: 120 IU/L — ABNORMAL HIGH (ref 39–117)
Bilirubin Total: 0.3 mg/dL (ref 0.0–1.2)
Bilirubin, Direct: 0.16 mg/dL (ref 0.00–0.40)
Total Protein: 6.9 g/dL (ref 6.0–8.5)

## 2019-01-11 LAB — BASIC METABOLIC PANEL
BUN/Creatinine Ratio: 19 (ref 10–24)
BUN: 24 mg/dL (ref 8–27)
CO2: 26 mmol/L (ref 20–29)
Calcium: 9.8 mg/dL (ref 8.6–10.2)
Chloride: 104 mmol/L (ref 96–106)
Creatinine, Ser: 1.24 mg/dL (ref 0.76–1.27)
GFR calc Af Amer: 70 mL/min/{1.73_m2} (ref >59)
GFR calc non Af Amer: 60 mL/min/{1.73_m2} (ref >59)
Glucose: 113 mg/dL — ABNORMAL HIGH (ref 65–99)
Potassium: 4.9 mmol/L (ref 3.5–5.2)
Sodium: 141 mmol/L (ref 134–144)

## 2019-01-11 LAB — PSA: Prostate Specific Ag, Serum: 0.5 ng/mL (ref 0.0–4.0)

## 2019-01-11 LAB — MICROALBUMIN / CREATININE URINE RATIO
Creatinine, Urine: 128.5 mg/dL
Microalb/Creat Ratio: 3 mg/g creat (ref 0–29)
Microalbumin, Urine: 4.1 ug/mL

## 2019-01-18 ENCOUNTER — Ambulatory Visit (INDEPENDENT_AMBULATORY_CARE_PROVIDER_SITE_OTHER): Payer: PPO | Admitting: Family Medicine

## 2019-01-18 ENCOUNTER — Other Ambulatory Visit: Payer: Self-pay

## 2019-01-18 ENCOUNTER — Encounter: Payer: Self-pay | Admitting: Family Medicine

## 2019-01-18 ENCOUNTER — Ambulatory Visit (HOSPITAL_COMMUNITY): Admission: RE | Admit: 2019-01-18 | Payer: PPO | Source: Ambulatory Visit

## 2019-01-18 VITALS — BP 130/88 | Temp 97.4°F | Ht 67.0 in | Wt 207.8 lb

## 2019-01-18 DIAGNOSIS — K760 Fatty (change of) liver, not elsewhere classified: Secondary | ICD-10-CM | POA: Diagnosis not present

## 2019-01-18 DIAGNOSIS — E119 Type 2 diabetes mellitus without complications: Secondary | ICD-10-CM | POA: Diagnosis not present

## 2019-01-18 DIAGNOSIS — Z0000 Encounter for general adult medical examination without abnormal findings: Secondary | ICD-10-CM | POA: Diagnosis not present

## 2019-01-18 DIAGNOSIS — M25551 Pain in right hip: Secondary | ICD-10-CM

## 2019-01-18 DIAGNOSIS — Z23 Encounter for immunization: Secondary | ICD-10-CM | POA: Diagnosis not present

## 2019-01-18 LAB — POCT GLYCOSYLATED HEMOGLOBIN (HGB A1C): Hemoglobin A1C: 5.4 % (ref 4.0–5.6)

## 2019-01-18 MED ORDER — ALLOPURINOL 300 MG PO TABS: 300.0000 mg | ORAL_TABLET | Freq: Every day | ORAL | 1 refills | Status: DC

## 2019-01-18 MED ORDER — HYDROCHLOROTHIAZIDE 25 MG PO TABS: 12.5000 mg | ORAL_TABLET | Freq: Every day | ORAL | 1 refills | Status: DC

## 2019-01-18 MED ORDER — ENALAPRIL MALEATE 20 MG PO TABS: 20.0000 mg | ORAL_TABLET | Freq: Every day | ORAL | 1 refills | Status: DC

## 2019-01-18 MED ORDER — ESOMEPRAZOLE MAGNESIUM 40 MG PO CPDR: DELAYED_RELEASE_CAPSULE | ORAL | 1 refills | Status: DC

## 2019-01-18 MED ORDER — FINASTERIDE 5 MG PO TABS: 5.0000 mg | ORAL_TABLET | Freq: Every day | ORAL | 1 refills | Status: DC

## 2019-01-18 NOTE — Progress Notes (Signed)
Subjective:    Patient ID: Mark Leonard, male    DOB: 1951-02-04, 68 y.o.   MRN: [id number]  HPI AWV- Annual Wellness Visit  The patient was seen for their annual wellness visit. The patient's past medical history, surgical history, and family history were reviewed. Pertinent vaccines were reviewed ( tetanus, pneumonia, shingles, flu) The patient's medication list was reviewed and updated.  The height and weight were entered.  BMI recorded in electronic record elsewhere  Cognitive screening was completed. Outcome of Mini - Cog: pass   Falls /depression screening electronically recorded within record elsewhere  Current tobacco usage: none (All patients who use tobacco were given written and verbal information on quitting)  Recent listing of emergency department/hospitalizations over the past year were reviewed.  current specialist the patient sees on a regular basis: none   Medicare annual wellness visit patient questionnaire was reviewed.  A written screening schedule for the patient for the next 5-10 years was given. Appropriate discussion of followup regarding next visit was discussed.  Still having pain in right hip.    Results for orders placed or performed in visit on 12/23/18  Lipid panel  Result Value Ref Range   Cholesterol, Total 93 (L) 100 - 199 mg/dL   Triglycerides 115 0 - 149 mg/dL   HDL 37 (L) >39 mg/dL   VLDL Cholesterol Cal 21 5 - 40 mg/dL   LDL Chol Calc (NIH) 35 0 - 99 mg/dL   Chol/HDL Ratio 2.5 0.0 - 5.0 ratio  Hepatic function panel  Result Value Ref Range   Total Protein 6.9 6.0 - 8.5 g/dL   Albumin 4.3 3.8 - 4.8 g/dL   Bilirubin Total 0.3 0.0 - 1.2 mg/dL   Bilirubin, Direct 0.16 0.00 - 0.40 mg/dL   Alkaline Phosphatase 120 (H) 39 - 117 IU/L   AST 44 (H) 0 - 40 IU/L   ALT 53 (H) 0 - 44 IU/L  Basic metabolic panel  Result Value Ref Range   Glucose 113 (H) 65 - 99 mg/dL   BUN 24 8 - 27 mg/dL   Creatinine, Ser 1.24 0.76 - 1.27 mg/dL   GFR  calc non Af Amer 60 >59 mL/min/1.73   GFR calc Af Amer 70 >59 mL/min/1.73   BUN/Creatinine Ratio 19 10 - 24   Sodium 141 134 - 144 mmol/L   Potassium 4.9 3.5 - 5.2 mmol/L   Chloride 104 96 - 106 mmol/L   CO2 26 20 - 29 mmol/L   Calcium 9.8 8.6 - 10.2 mg/dL  PSA  Result Value Ref Range   Prostate Specific Ag, Serum 0.5 0.0 - 4.0 ng/mL  Microalbumin / creatinine urine ratio  Result Value Ref Range   Creatinine, Urine 128.5 Not Estab. mg/dL   Microalbumin, Urine 4.1 Not Estab. ug/mL   Microalb/Creat Ratio 3 0 - 29 mg/g creat   Giving way sensation, usually ocur with suden pain, and almost makes him fall   Eye exam in august  Not had the flu shot yet   Patient claims compliance with diabetes medication. No obvious side effects. Reports no substantial low sugar spells. Most numbers are generally in good range when checked fasting. Generally does not miss a dose of medication. Watching diabetic diet closely  Patient has progressive hip pain.  Right hip.  Recalls no remote or acute injuries.  Sharp pain at times.  At times wants to give way.  Usually this occurs right before pain.  Usually occurs when he is asked  in his hip to take on a different position and body "     Review of Systems  Constitutional: Negative for activity change, appetite change and fever.  HENT: Negative for congestion and rhinorrhea.   Eyes: Negative for discharge.  Respiratory: Negative for cough and wheezing.   Cardiovascular: Negative for chest pain.  Gastrointestinal: Negative for abdominal pain, blood in stool and vomiting.  Genitourinary: Negative for difficulty urinating and frequency.  Musculoskeletal: Negative for neck pain.  Skin: Negative for rash.  Allergic/Immunologic: Negative for environmental allergies and food allergies.  Neurological: Negative for weakness and headaches.  Psychiatric/Behavioral: Negative for agitation.  All other systems reviewed and are negative.      Objective:    Physical Exam Vitals signs reviewed.  Constitutional:      Appearance: He is well-developed.  HENT:     Head: Normocephalic and atraumatic.     Right Ear: External ear normal.     Left Ear: External ear normal.     Nose: Nose normal.  Eyes:     Pupils: Pupils are equal, round, and reactive to light.  Neck:     Musculoskeletal: Normal range of motion and neck supple.     Thyroid: No thyromegaly.  Cardiovascular:     Rate and Rhythm: Normal rate and regular rhythm.     Heart sounds: Normal heart sounds. No murmur.  Pulmonary:     Effort: Pulmonary effort is normal. No respiratory distress.     Breath sounds: Normal breath sounds. No wheezing.  Abdominal:     General: Bowel sounds are normal. There is no distension.     Palpations: Abdomen is soft. There is no mass.     Tenderness: There is no abdominal tenderness.  Genitourinary:    Penis: Normal.   Musculoskeletal: Normal range of motion.  Lymphadenopathy:     Cervical: No cervical adenopathy.  Skin:    General: Skin is warm and dry.     Findings: No erythema.  Neurological:     Mental Status: He is alert.     Motor: No abnormal muscle tone.  Psychiatric:        Behavior: Behavior normal.        Judgment: Judgment normal.     Feet sensation intact pulses intact      Assessment & Plan:  Impression wellness exam.  Patient declines colonoscopy.  Diet discussed.  Exercise discussed.  Vaccines discussed and administered  2.  Right hip pain.  Some pain with rotation.  Time for x-ray further recommendations based on results.Anticipatory guidance given  3.  Type 2 diabetes A1c excellent 5.4% continue same treatment  4.  Elevated liver enzymes.  Patient has had fatty liver in the past with elevated liver enzymes.  Is gained 30 pounds.  Strongly encouraged to lose weight  Follow-up in 6 months further recommendations based on x-ray results.  Also blood work reviewed with patient

## 2019-01-19 ENCOUNTER — Telehealth: Payer: Self-pay | Admitting: *Deleted

## 2019-01-19 ENCOUNTER — Ambulatory Visit (HOSPITAL_COMMUNITY)
Admission: RE | Admit: 2019-01-19 | Discharge: 2019-01-19 | Disposition: A | Discharge status: Home / Self Care | Payer: PPO | Source: Ambulatory Visit | Attending: Family Medicine | Admitting: Family Medicine

## 2019-01-19 DIAGNOSIS — M25551 Pain in right hip: Secondary | ICD-10-CM | POA: Diagnosis not present

## 2019-01-19 DIAGNOSIS — Z1211 Encounter for screening for malignant neoplasm of colon: Secondary | ICD-10-CM

## 2019-01-19 NOTE — Telephone Encounter (Signed)
Pt had physical yesterday and on the way out he asked about his colonoscopy. He said last one was with Dr. Arnoldo Morale 10 years ago but he would like to go to dr Sydell Axon. Is it ok to put in referral?

## 2019-01-19 NOTE — Telephone Encounter (Signed)
yes

## 2019-01-20 ENCOUNTER — Other Ambulatory Visit: Payer: Self-pay | Admitting: Family Medicine

## 2019-01-20 MED ORDER — GLUCOSE BLOOD VI STRP: ORAL_STRIP | 12 refills | Status: DC

## 2019-01-20 NOTE — Telephone Encounter (Signed)
Referral put in and pt was notified.  

## 2019-01-20 NOTE — Addendum Note (Signed)
Addended by: Vicente Males on: 01/20/2019 03:00 PM   Modules accepted: Orders

## 2019-01-25 ENCOUNTER — Telehealth: Payer: Self-pay | Admitting: Family Medicine

## 2019-01-25 ENCOUNTER — Encounter: Payer: Self-pay | Admitting: Internal Medicine

## 2019-01-25 MED ORDER — GLUCOSE BLOOD VI STRP: ORAL_STRIP | 12 refills | Status: DC

## 2019-01-25 NOTE — Telephone Encounter (Signed)
Test strips sent in to pharmacy with frequency and note to dispense 90 day supply

## 2019-01-25 NOTE — Telephone Encounter (Signed)
[facility] mail order called about patient test strips needing frequency and 90 day supply with refills. [phone]

## 2019-02-23 ENCOUNTER — Ambulatory Visit (INDEPENDENT_AMBULATORY_CARE_PROVIDER_SITE_OTHER): Payer: Self-pay | Admitting: *Deleted

## 2019-02-23 ENCOUNTER — Other Ambulatory Visit: Payer: Self-pay

## 2019-02-23 DIAGNOSIS — Z86010 Personal history of colonic polyps: Secondary | ICD-10-CM

## 2019-02-23 MED ORDER — PEG 3350-KCL-NA BICARB-NACL 420 G PO SOLR: 4000.0000 mL | Freq: Once | ORAL | 0 refills | Status: AC

## 2019-02-23 NOTE — Patient Instructions (Signed)
Mark Leonard   27-May-1950 MRN: [id number]    Procedure Date: 05/17/2019 Time to register: 7:30 am Place to register: [facility] Procedure Time: 8:30 am Scheduled provider: Dr. Gala Romney  PREPARATION FOR COLONOSCOPY WITH TRI-LYTE SPLIT PREP  Please notify us immediately if you are diabetic, take iron supplements, or if you are on Coumadin or any other blood thinners.    You will need to purchase 1 fleet enema and 1 box of Bisacodyl 16m tablets.   2 DAYS BEFORE PROCEDURE:  DATE: 05/15/2019   DAY: Monday Begin clear liquid diet AFTER your lunch meal. NO SOLID FOODS after this point.  1 DAY BEFORE PROCEDURE:  DATE: 05/16/2019   DAY: Tuesday Continue clear liquids the entire day - NO SOLID FOOD.    At 2:00 pm:  Take 2 Bisacodyl tablets.   At 4:00pm:  Start drinking your solution. Make sure you mix well per instructions on the bottle. Try to drink 1 (one) 8 ounce glass every 10-15 minutes until you have consumed HALF the jug. You should complete by 6:00pm.You must keep the left over solution refrigerated until completed next day.  Continue clear liquids. You must drink plenty of clear liquids to prevent dehyration and kidney failure.     DAY OF PROCEDURE:   DATE: 05/17/2019   DAY: Wednesday If you take medications for your heart, blood pressure or breathing, you may take these medications.    Five hours before your procedure time @ 3:30 am:  Finish remaining amout of bowel prep, drinking 1 (one) 8 ounce glass every 10-15 minutes until complete. You have two hours to consume remaining prep.   Three hours before your procedure time @ 5:30 am:  Nothing by mouth.   At least one hour before going to the hospital:  Give yourself one Fleet enema. You may take your morning medications with sip of water unless we have instructed otherwise.      Please see below for Dietary Information.  CLEAR LIQUIDS INCLUDE:  Water Jello (NOT red in color)   Ice Popsicles (NOT red in color)    Tea (sugar ok, no milk/cream) Powdered fruit flavored drinks  Coffee (sugar ok, no milk/cream) Gatorade/ Lemonade/ Kool-Aid  (NOT red in color)   Juice: apple, white grape, white cranberry Soft drinks  Clear bullion, consomme, broth (fat free beef/chicken/vegetable)  Carbonated beverages (any kind)  Strained chicken noodle soup Hard Candy   Remember: Clear liquids are liquids that will allow you to see your fingers on the other side of a clear glass. Be sure liquids are NOT red in color, and not cloudy, but CLEAR.  DO NOT EAT OR DRINK ANY OF THE FOLLOWING:  Dairy products of any kind   Cranberry juice Tomato juice / V8 juice   Grapefruit juice Orange juice     Red grape juice  Do not eat any solid foods, including such foods as: cereal, oatmeal, yogurt, fruits, vegetables, creamed soups, eggs, bread, crackers, pureed foods in a blender, etc.   HELPFUL HINTS FOR DRINKING PREP SOLUTION:   Make sure prep is extremely cold. Mix and refrigerate the the morning of the prep. You may also put in the freezer.   You may try mixing some Crystal Light or Country Time Lemonade if you prefer. Mix in small amounts; add more if necessary.  Try drinking through a straw  Rinse mouth with water or a mouthwash between glasses, to remove after-taste.  Try sipping on a cold beverage /ice/ popsicles  between glasses of prep.  Place a piece of sugar-free hard candy in mouth between glasses.  If you become nauseated, try consuming smaller amounts, or stretch out the time between glasses. Stop for 30-60 minutes, then slowly start back drinking.        OTHER INSTRUCTIONS  You will need a responsible adult at least 66 years of age to accompany you and drive you home. This person must remain in the waiting room during your procedure. The hospital will cancel your procedure if you do not have a responsible adult with you.   1. Wear loose fitting clothing that is easily removed. 2. Leave jewelry and  other valuables at home.  3. Remove all body piercing jewelry and leave at home. 4. Total time from sign-in until discharge is approximately 2-3 hours. 5. You should go home directly after your procedure and rest. You can resume normal activities the day after your procedure. 6. The day of your procedure you should not:  Drive  Make legal decisions  Operate machinery  Drink alcohol  Return to work   You may call the office (Dept: [phone]) before 5:00pm, or page the doctor on call [phone]) after 5:00pm, for further instructions, if necessary.   Insurance Information YOU WILL NEED TO CHECK WITH YOUR INSURANCE COMPANY FOR THE BENEFITS OF COVERAGE YOU HAVE FOR THIS PROCEDURE.  UNFORTUNATELY, NOT ALL INSURANCE COMPANIES HAVE BENEFITS TO COVER ALL OR PART OF THESE TYPES OF PROCEDURES.  IT IS YOUR RESPONSIBILITY TO CHECK YOUR BENEFITS, HOWEVER, WE WILL BE GLAD TO ASSIST YOU WITH ANY CODES YOUR INSURANCE COMPANY MAY NEED.    PLEASE NOTE THAT MOST INSURANCE COMPANIES WILL NOT COVER A SCREENING COLONOSCOPY FOR PEOPLE UNDER THE AGE OF 50  IF YOU HAVE BCBS INSURANCE, YOU MAY HAVE BENEFITS FOR A SCREENING COLONOSCOPY BUT IF POLYPS ARE FOUND THE DIAGNOSIS WILL CHANGE AND THEN YOU MAY HAVE A DEDUCTIBLE THAT WILL NEED TO BE MET. SO PLEASE MAKE SURE YOU CHECK YOUR BENEFITS FOR A SCREENING COLONOSCOPY AS WELL AS A DIAGNOSTIC COLONOSCOPY.

## 2019-02-23 NOTE — Progress Notes (Signed)
OK to schedule

## 2019-02-23 NOTE — Progress Notes (Signed)
Gastroenterology Pre-Procedure Review  Request Date: 02/23/2019 Requesting Physician: Dr. Wolfgang Phoenix, Last TCS 05/18/2008 done by Dr. Arnoldo Morale, adenomatous polyp  PATIENT REVIEW QUESTIONS: The patient responded to the following health history questions as indicated:    1. Diabetes Melitis: yes, but not on medication, pt says PCP says he is diabetic, but pt says he has kept it under control without medicine 2. Joint replacements in the past 12 months: no 3. Major health problems in the past 3 months: no 4. Has an artificial valve or MVP: no 5. Has a defibrillator: no 6. Has been advised in past to take antibiotics in advance of a procedure like teeth cleaning: no 7. Family history of colon cancer: no  8. Alcohol Use: no 9. Illicit drug Use: no 10. History of sleep apnea: no  11. History of coronary artery or other vascular stents placed within the last 12 months: no 12. History of any prior anesthesia complications: no 13. There is no height or weight on file to calculate BMI.ht: 5'7 wt: 205 lbs    MEDICATIONS & ALLERGIES:    Patient reports the following regarding taking any blood thinners:   Plavix? no Aspirin? no Coumadin? no Brilinta? no Xarelto? no Eliquis? no Pradaxa? no Savaysa? no Effient? no  Patient confirms/reports the following medications:  Current Outpatient Medications  Medication Sig Dispense Refill  . allopurinol (ZYLOPRIM) 300 MG tablet Take 1 tablet (300 mg total) by mouth daily. 90 tablet 1  . blood glucose meter kit and supplies KIT Dispense based on patient and insurance preference. Use daily as directed. (FOR ICD-10 code E11.9) 1 each 5  . chlorzoxazone (PARAFON) 500 MG tablet TAKE 1 TABLET BY MOUTH THREE TIMES DAILY AS NEEDED (Patient taking differently: as needed. ) 36 tablet 0  . enalapril (VASOTEC) 20 MG tablet Take 1 tablet (20 mg total) by mouth at bedtime. 90 tablet 1  . esomeprazole (NEXIUM) 40 MG capsule TAKE 1 CAPSULE DAILY BEFOREBREAKFAST 90 capsule  1  . finasteride (PROSCAR) 5 MG tablet Take 1 tablet (5 mg total) by mouth daily. 90 tablet 1  . fluticasone (FLONASE) 50 MCG/ACT nasal spray Place 2 sprays into both nostrils daily. (Patient taking differently: Place 2 sprays into both nostrils as needed. ) 16 g 5  . glucose blood test strip Use to test blood sugar once daily Use as instructed. Dx:E11.9 100 each 12  . hydrochlorothiazide (HYDRODIURIL) 25 MG tablet Take 0.5 tablets (12.5 mg total) by mouth daily. 45 tablet 1  . metroNIDAZOLE (METROCREAM) 0.75 % cream APPLY CREAM TOPICALLY TWICE DAILY AS NEEDED (Patient taking differently: daily. ) 45 g 4  . Multiple Vitamins-Minerals (MULTIVITAMIN MEN 50+ PO) Take by mouth daily.     No current facility-administered medications for this visit.     Patient confirms/reports the following allergies:  Allergies  Allergen Reactions  . Tape     No orders of the defined types were placed in this encounter.   AUTHORIZATION INFORMATION Primary Insurance: [employer],  ID #: [id number] Pre-Cert / Josem Kaufmann required: No, not required  SCHEDULE INFORMATION: Procedure has been scheduled as follows:  Date: 05/17/2019, Time: 8:30 Location: [facility] with Dr. Gala Romney  This Gastroenterology Pre-Precedure Review Form is being routed to the following provider(s): Aliene Altes, PA

## 2019-02-24 NOTE — Addendum Note (Signed)
Addended by: Metro Kung on: 02/24/2019 07:42 AM   Modules accepted: Orders, SmartSet

## 2019-03-01 ENCOUNTER — Other Ambulatory Visit: Payer: Self-pay

## 2019-03-10 DIAGNOSIS — M25551 Pain in right hip: Secondary | ICD-10-CM | POA: Diagnosis not present

## 2019-03-10 DIAGNOSIS — M545 Low back pain: Secondary | ICD-10-CM | POA: Diagnosis not present

## 2019-03-14 ENCOUNTER — Other Ambulatory Visit: Payer: Self-pay | Admitting: Student

## 2019-03-14 ENCOUNTER — Other Ambulatory Visit (HOSPITAL_COMMUNITY): Payer: Self-pay | Admitting: Student

## 2019-03-14 DIAGNOSIS — M25551 Pain in right hip: Secondary | ICD-10-CM

## 2019-03-21 ENCOUNTER — Other Ambulatory Visit: Payer: Self-pay

## 2019-03-21 ENCOUNTER — Ambulatory Visit (HOSPITAL_COMMUNITY)
Admission: RE | Admit: 2019-03-21 | Discharge: 2019-03-21 | Disposition: A | Discharge status: Home / Self Care | Payer: PPO | Source: Ambulatory Visit | Attending: Student | Admitting: Student

## 2019-03-21 DIAGNOSIS — M25551 Pain in right hip: Secondary | ICD-10-CM | POA: Diagnosis not present

## 2019-03-21 DIAGNOSIS — S76311A Strain of muscle, fascia and tendon of the posterior muscle group at thigh level, right thigh, initial encounter: Secondary | ICD-10-CM | POA: Diagnosis not present

## 2019-04-07 DIAGNOSIS — U071 COVID-19: Secondary | ICD-10-CM

## 2019-04-07 HISTORY — DX: COVID-19: U07.1

## 2019-04-12 ENCOUNTER — Other Ambulatory Visit: Payer: Self-pay

## 2019-04-12 ENCOUNTER — Ambulatory Visit (INDEPENDENT_AMBULATORY_CARE_PROVIDER_SITE_OTHER): Payer: PPO | Admitting: Family Medicine

## 2019-04-12 DIAGNOSIS — J019 Acute sinusitis, unspecified: Secondary | ICD-10-CM

## 2019-04-12 DIAGNOSIS — Z20822 Contact with and (suspected) exposure to covid-19: Secondary | ICD-10-CM | POA: Diagnosis not present

## 2019-04-12 DIAGNOSIS — U071 COVID-19: Secondary | ICD-10-CM | POA: Diagnosis not present

## 2019-04-12 MED ORDER — CEFDINIR 300 MG PO CAPS: 300.0000 mg | ORAL_CAPSULE | Freq: Two times a day (BID) | ORAL | 0 refills | Status: DC

## 2019-04-12 NOTE — Progress Notes (Signed)
   Subjective:  Audio only  Patient ID: Mark Leonard, male    DOB: 01-30-1951, 69 y.o.   MRN: [id number]  Sinusitis This is a new problem. Episode onset: 3 days. Associated symptoms include congestion and ear pain. (Chills on sunday) Treatments tried: ibuprofen.   Virtual Visit via Telephone Note  I connected with Mark Leonard on 04/12/19 at  3:30 PM EST by telephone and verified that I am speaking with the correct person using two identifiers.  Location: Patient: home Provider:  Office   I discussed the limitations, risks, security and privacy concerns of performing an evaluation and management service by telephone and the availability of in person appointments. I also discussed with the patient that there may be a patient responsible charge related to this service. The patient expressed understanding and agreed to proceed.   History of Present Illness:    Observations/Objective:   Assessment and Plan:   Follow Up Instructions:    I discussed the assessment and treatment plan with the patient. The patient was provided an opportunity to ask questions and all were answered. The patient agreed with the plan and demonstrated an understanding of the instructions.   The patient was advised to call back or seek an in-person evaluation if the symptoms worsen or if the condition fails to improve as anticipated.  I provided 18 minutes of non-face-to-face time during this encounter.   Cong and drainage   Worsens thru the eve   No hi fevers   Some chills   No sickness with     Review of Systems  HENT: Positive for congestion and ear pain.        Objective:   Physical Exam  Virtual     Assessment & Plan:  Impression potential COVID-19 infection.  Discussed.  Positive exposures to others with similar illness.  His testing is now pending.  Patient has some symptoms potentially consistent.  Will cover with antibiotics for potential sinusitis.  Warning signs  discussed.

## 2019-04-13 ENCOUNTER — Ambulatory Visit: Payer: PPO | Attending: Internal Medicine

## 2019-04-13 ENCOUNTER — Telehealth: Payer: Self-pay | Admitting: Family Medicine

## 2019-04-13 ENCOUNTER — Other Ambulatory Visit: Payer: Self-pay

## 2019-04-13 DIAGNOSIS — Z20822 Contact with and (suspected) exposure to covid-19: Secondary | ICD-10-CM

## 2019-04-13 NOTE — Telephone Encounter (Signed)
Pt's daughter called stating that [facility] does not have the antibiotic we ordered yesterday  Wondering if we can call in something different or send to a different pharmacy  Please advise & call pt

## 2019-04-13 NOTE — Telephone Encounter (Signed)
Pt states he was told to go ahead and get tested for covid and also gave omnicef for sinus infection. Did have covid test this morning. Pt states now he would rather wait til tomorrow and get the omnicef because pharm said they would have it tomorrow

## 2019-04-14 LAB — NOVEL CORONAVIRUS, NAA: SARS-CoV-2, NAA: DETECTED — AB

## 2019-04-15 ENCOUNTER — Telehealth: Payer: Self-pay | Admitting: Infectious Diseases

## 2019-04-15 NOTE — Telephone Encounter (Signed)
Called to discuss with patient about Covid symptoms and the use of bamlanivimab, a monoclonal antibody infusion for those with mild to moderate Covid symptoms and at a high risk of hospitalization.  Pt is qualified for this infusion at the [facility] infusion center due to Age > 17   Message unable to be left on voicemail. MyChart message sent.   Symptoms of nasal congestion started on 04/09/19. Given antibiotics for presumed sinus infection and wound up testing positive for COVID.

## 2019-04-17 ENCOUNTER — Telehealth: Payer: Self-pay | Admitting: *Deleted

## 2019-04-17 NOTE — Telephone Encounter (Signed)
good

## 2019-04-17 NOTE — Telephone Encounter (Signed)
covid test positive. Called pt. He states he is doing fine and talked with a [facility] at [facility] about the result. He states he just has some congestion. No fever, no sob. He declined appt today. I went over warning signs with the pt and told him to follow up here or ED if anything changed. He verbalized understanding.

## 2019-04-24 ENCOUNTER — Telehealth: Payer: Self-pay | Admitting: Family Medicine

## 2019-04-24 MED ORDER — ENALAPRIL MALEATE 20 MG PO TABS: 20.0000 mg | ORAL_TABLET | Freq: Every day | ORAL | 0 refills | Status: DC

## 2019-04-24 NOTE — Telephone Encounter (Signed)
Patient needs about a weeks worth of Enalapril 20 mg.  He said he is waiting for his mail order to come in and it's delayed and he is out of his medicine.  [facility]

## 2019-04-24 NOTE — Telephone Encounter (Signed)
Prescription sent electronically to pharmacy. Patient notified. 

## 2019-05-01 ENCOUNTER — Telehealth: Payer: Self-pay | Admitting: Internal Medicine

## 2019-05-01 NOTE — Telephone Encounter (Signed)
Patient called stating on the news he heard that we were cancelling procedures, I told him that if his was cancelled, we would be on contact with him

## 2019-05-01 NOTE — Telephone Encounter (Signed)
Called and informed pt our office will contact him if procedure has to be cancelled.

## 2019-05-04 ENCOUNTER — Telehealth: Payer: Self-pay | Admitting: Internal Medicine

## 2019-05-04 NOTE — Telephone Encounter (Signed)
Miralax instructions mailed to pt. Called and informed pt.

## 2019-05-04 NOTE — Telephone Encounter (Signed)
Pt's prep is on back order and needs a new prep prescription called into [facility]

## 2019-05-11 ENCOUNTER — Encounter: Payer: Self-pay | Admitting: Family Medicine

## 2019-05-15 ENCOUNTER — Other Ambulatory Visit (HOSPITAL_COMMUNITY)
Admission: RE | Admit: 2019-05-15 | Discharge: 2019-05-15 | Disposition: A | Discharge status: Home / Self Care | Payer: PPO | Source: Ambulatory Visit | Attending: Internal Medicine | Admitting: Internal Medicine

## 2019-05-15 ENCOUNTER — Other Ambulatory Visit: Payer: Self-pay

## 2019-05-15 DIAGNOSIS — Z20822 Contact with and (suspected) exposure to covid-19: Secondary | ICD-10-CM | POA: Diagnosis not present

## 2019-05-15 DIAGNOSIS — Z01812 Encounter for preprocedural laboratory examination: Secondary | ICD-10-CM | POA: Insufficient documentation

## 2019-05-15 LAB — SARS CORONAVIRUS 2 (TAT 6-24 HRS): SARS Coronavirus 2: NEGATIVE

## 2019-05-17 ENCOUNTER — Encounter (HOSPITAL_COMMUNITY): Payer: Self-pay | Admitting: Internal Medicine

## 2019-05-17 ENCOUNTER — Other Ambulatory Visit: Payer: Self-pay

## 2019-05-17 ENCOUNTER — Ambulatory Visit (HOSPITAL_COMMUNITY)
Admission: RE | Admit: 2019-05-17 | Discharge: 2019-05-17 | Disposition: A | Discharge status: Home / Self Care | Payer: PPO | Attending: Internal Medicine | Admitting: Internal Medicine

## 2019-05-17 ENCOUNTER — Encounter (HOSPITAL_COMMUNITY)
Admission: RE | Disposition: A | Discharge status: Home / Self Care | Payer: Self-pay | Source: Home / Self Care | Attending: Internal Medicine

## 2019-05-17 DIAGNOSIS — D124 Benign neoplasm of descending colon: Secondary | ICD-10-CM | POA: Diagnosis not present

## 2019-05-17 DIAGNOSIS — Z79899 Other long term (current) drug therapy: Secondary | ICD-10-CM | POA: Insufficient documentation

## 2019-05-17 DIAGNOSIS — Z1211 Encounter for screening for malignant neoplasm of colon: Secondary | ICD-10-CM | POA: Diagnosis not present

## 2019-05-17 DIAGNOSIS — K635 Polyp of colon: Secondary | ICD-10-CM

## 2019-05-17 DIAGNOSIS — Z86010 Personal history of colonic polyps: Secondary | ICD-10-CM

## 2019-05-17 DIAGNOSIS — K621 Rectal polyp: Secondary | ICD-10-CM

## 2019-05-17 DIAGNOSIS — K219 Gastro-esophageal reflux disease without esophagitis: Secondary | ICD-10-CM | POA: Diagnosis not present

## 2019-05-17 DIAGNOSIS — D123 Benign neoplasm of transverse colon: Secondary | ICD-10-CM | POA: Insufficient documentation

## 2019-05-17 DIAGNOSIS — Z87891 Personal history of nicotine dependence: Secondary | ICD-10-CM | POA: Diagnosis not present

## 2019-05-17 DIAGNOSIS — D122 Benign neoplasm of ascending colon: Secondary | ICD-10-CM | POA: Diagnosis not present

## 2019-05-17 DIAGNOSIS — Z888 Allergy status to other drugs, medicaments and biological substances status: Secondary | ICD-10-CM | POA: Diagnosis not present

## 2019-05-17 HISTORY — PX: COLONOSCOPY: SHX5424

## 2019-05-17 HISTORY — PX: POLYPECTOMY: SHX5525

## 2019-05-17 LAB — GLUCOSE, CAPILLARY: Glucose-Capillary: 131 mg/dL — ABNORMAL HIGH (ref 70–99)

## 2019-05-17 SURGERY — COLONOSCOPY
Anesthesia: Moderate Sedation

## 2019-05-17 MED ORDER — MEPERIDINE HCL 50 MG/ML IJ SOLN
INTRAMUSCULAR | Status: AC
  Filled 2019-05-17: qty 1

## 2019-05-17 MED ORDER — STERILE WATER FOR IRRIGATION IR SOLN
Status: DC | PRN
  Administered 2019-05-17: 5 mL

## 2019-05-17 MED ORDER — MIDAZOLAM HCL 5 MG/5ML IJ SOLN
INTRAMUSCULAR | Status: DC | PRN
  Administered 2019-05-17 (×2): 1 mg via INTRAVENOUS
  Administered 2019-05-17: 2 mg via INTRAVENOUS
  Administered 2019-05-17: 1 mg via INTRAVENOUS

## 2019-05-17 MED ORDER — ONDANSETRON HCL 4 MG/2ML IJ SOLN
INTRAMUSCULAR | Status: DC | PRN
  Administered 2019-05-17: 4 mg via INTRAVENOUS

## 2019-05-17 MED ORDER — MEPERIDINE HCL 100 MG/ML IJ SOLN
INTRAMUSCULAR | Status: DC | PRN
  Administered 2019-05-17: 15 mg
  Administered 2019-05-17: 25 mg

## 2019-05-17 MED ORDER — MIDAZOLAM HCL 5 MG/5ML IJ SOLN
INTRAMUSCULAR | Status: AC
  Filled 2019-05-17: qty 5

## 2019-05-17 MED ORDER — ONDANSETRON HCL 4 MG/2ML IJ SOLN
INTRAMUSCULAR | Status: AC
  Filled 2019-05-17: qty 2

## 2019-05-17 MED ORDER — SODIUM CHLORIDE 0.9 % IV SOLN: INTRAVENOUS | Status: DC

## 2019-05-17 NOTE — H&P (Signed)
_0 @   Primary Care Physician:  Mikey Kirschner, MD Primary Gastroenterologist:  Dr. Gala Romney  Pre-Procedure History & Physical: HPI:  Mark Leonard is a 69 y.o. male is here for a screening colonoscopy.  Distant history of benign polyps last colonoscopy 2010.  No bowel symptoms.  No family history of colon cancer.  Here for screening examination.  Past Medical History:  Diagnosis Date  . Acid reflux   . Allergy   . Bradycardia   . Chronic kidney disease    kidney stones  . Fatty liver   . Gout   . Heart murmur   . Impaired fasting glucose   . Mitral regurgitation   . Prostate hypertrophy   . Venous stasis     Past Surgical History:  Procedure Laterality Date  . CARDIOVASCULAR STRESS TEST    . CHOLECYSTECTOMY    . COLONOSCOPY    . KIDNEY STONE SURGERY    . VASECTOMY      Prior to Admission medications   Medication Sig Start Date End Date Taking? Authorizing Provider  allopurinol (ZYLOPRIM) 300 MG tablet Take 1 tablet (300 mg total) by mouth daily. 01/18/19  Yes Mikey Kirschner, MD  enalapril (VASOTEC) 20 MG tablet Take 1 tablet (20 mg total) by mouth at bedtime. 04/24/19 05/24/19 Yes Mikey Kirschner, MD  esomeprazole (NEXIUM) 40 MG capsule TAKE 1 CAPSULE DAILY BEFOREBREAKFAST Patient taking differently: Take 40 mg by mouth daily.  01/18/19  Yes Mikey Kirschner, MD  finasteride (PROSCAR) 5 MG tablet Take 1 tablet (5 mg total) by mouth daily. 01/18/19  Yes Mikey Kirschner, MD  hydrochlorothiazide (HYDRODIURIL) 25 MG tablet Take 0.5 tablets (12.5 mg total) by mouth daily. 01/18/19  Yes Mikey Kirschner, MD  metroNIDAZOLE (METROCREAM) 0.75 % cream APPLY CREAM TOPICALLY TWICE DAILY AS NEEDED Patient taking differently: Apply 1 application topically every other day. In the evening 05/11/17  Yes Mikey Kirschner, MD  Multiple Vitamins-Minerals (MULTIVITAMIN MEN 50+ PO) Take 1 tablet by mouth daily.    Yes [provider]  blood glucose meter kit and supplies  KIT Dispense based on patient and insurance preference. Use daily as directed. (FOR ICD-10 code E11.9) 07/06/17   Mikey Kirschner, MD  cefdinir (OMNICEF) 300 MG capsule Take 1 capsule (300 mg total) by mouth 2 (two) times daily. Patient not taking: Reported on 05/03/2019 04/12/19   Mikey Kirschner, MD  chlorzoxazone (PARAFON) 500 MG tablet TAKE 1 TABLET BY MOUTH THREE TIMES DAILY AS NEEDED Patient taking differently: Take 500 mg by mouth daily as needed for muscle spasms.  04/20/18   Mikey Kirschner, MD  fluticasone (FLONASE) 50 MCG/ACT nasal spray Place 2 sprays into both nostrils daily. Patient taking differently: Place 2 sprays into both nostrils daily as needed for allergies.  09/02/18   Kathyrn Drown, MD  glucose blood test strip Use to test blood sugar once daily Use as instructed. Dx:E11.9 01/25/19   Mikey Kirschner, MD    Allergies as of 02/24/2019 - Review Complete 02/23/2019  Allergen Reaction Noted  . Tape  06/22/2012    Family History  Problem Relation Age of Onset  . Hypertension Mother   . Pulmonary disease Father   . Colon cancer Neg Hx     Social History   Socioeconomic History  . Marital status: Married    Spouse name: Not on file  . Number of children: Not on file  . Years of education: Not on file  .  Highest education level: Not on file  Occupational History  . Not on file  Tobacco Use  . Smoking status: Former Smoker    Years: 26.00    Quit date: 04/04/1999    Years since quitting: 20.1  . Smokeless tobacco: Former Systems developer    Quit date: 05/26/1998  Substance and Sexual Activity  . Alcohol use: No    Alcohol/week: 0.0 standard drinks  . Drug use: No  . Sexual activity: Not on file  Other Topics Concern  . Not on file  Social History Narrative  . Not on file   Social Determinants of Health   Financial Resource Strain:   . Difficulty of Paying Living Expenses: Not on file  Food Insecurity:   . Worried About Charity fundraiser in the Last Year:  Not on file  . Ran Out of Food in the Last Year: Not on file  Transportation Needs:   . Lack of Transportation (Medical): Not on file  . Lack of Transportation (Non-Medical): Not on file  Physical Activity:   . Days of Exercise per Week: Not on file  . Minutes of Exercise per Session: Not on file  Stress:   . Feeling of Stress : Not on file  Social Connections:   . Frequency of Communication with Friends and Family: Not on file  . Frequency of Social Gatherings with Friends and Family: Not on file  . Attends Religious Services: Not on file  . Active Member of Clubs or Organizations: Not on file  . Attends Archivist Meetings: Not on file  . Marital Status: Not on file  Intimate Partner Violence:   . Fear of Current or Ex-Partner: Not on file  . Emotionally Abused: Not on file  . Physically Abused: Not on file  . Sexually Abused: Not on file    Review of Systems: See HPI, otherwise negative ROS  Physical Exam: BP (!) 119/99   Pulse 93   Temp 98.1 F (36.7 C) (Oral)   Resp 18   Ht _0  (1.702 m)   Wt 91.2 kg   SpO2 96%   BMI 31.48 kg/m  General:   Alert,  Well-developed, well-nourished, pleasant and cooperative in NAD Lungs:  Clear throughout to auscultation.   No wheezes, crackles, or rhonchi. No acute distress. Heart:  Regular rate and rhythm; no murmurs, clicks, rubs,  or gallops. Abdomen:  Soft, nontender and nondistended. No masses, hepatosplenomegaly or hernias noted. Normal bowel sounds, without guarding, and without rebound.    Impression/Plan: Mark Leonard is now here to undergo a screening colonoscopy.  Average rescreening examination.  Risks, benefits, limitations, imponderables and alternatives regarding colonoscopy have been reviewed with the patient. Questions have been answered. All parties agreeable.     Notice:  This dictation was prepared with Dragon dictation along with smaller phrase technology. Any transcriptional errors that result  from this process are unintentional and may not be corrected upon review.

## 2019-05-17 NOTE — Discharge Instructions (Signed)
Colonoscopy Discharge Instructions  Read the instructions outlined below and refer to this sheet in the next few weeks. These discharge instructions provide you with general information on caring for yourself after you leave the hospital. Your doctor may also give you specific instructions. While your treatment has been planned according to the most current medical practices available, unavoidable complications occasionally occur. If you have any problems or questions after discharge, call Dr. Gala Romney at [phone]. ACTIVITY  You may resume your regular activity, but move at a slower pace for the next 24 hours.   Take frequent rest periods for the next 24 hours.   Walking will help get rid of the air and reduce the bloated feeling in your belly (abdomen).   No driving for 24 hours (because of the medicine (anesthesia) used during the test).    Do not sign any important legal documents or operate any machinery for 24 hours (because of the anesthesia used during the test).  NUTRITION  Drink plenty of fluids.   You may resume your normal diet as instructed by your doctor.   Begin with a light meal and progress to your normal diet. Heavy or fried foods are harder to digest and may make you feel sick to your stomach (nauseated).   Avoid alcoholic beverages for 24 hours or as instructed.  MEDICATIONS  You may resume your normal medications unless your doctor tells you otherwise.  WHAT YOU CAN EXPECT TODAY  Some feelings of bloating in the abdomen.   Passage of more gas than usual.   Spotting of blood in your stool or on the toilet paper.  IF YOU HAD POLYPS REMOVED DURING THE COLONOSCOPY:  No aspirin products for 7 days or as instructed.   No alcohol for 7 days or as instructed.   Eat a soft diet for the next 24 hours.  FINDING OUT THE RESULTS OF YOUR TEST Not all test results are available during your visit. If your test results are not back during the visit, make an appointment  with your caregiver to find out the results. Do not assume everything is normal if you have not heard from your caregiver or the medical facility. It is important for you to follow up on all of your test results.  SEEK IMMEDIATE MEDICAL ATTENTION IF:  You have more than a spotting of blood in your stool.   Your belly is swollen (abdominal distention).   You are nauseated or vomiting.   You have a temperature over 101.   You have abdominal pain or discomfort that is severe or gets worse throughout the day.    Colon polyp information provided  Further recommendations to follow pending review of pathology report  At patient request, I discussed results with wife, Pamala Hurry at [phone]     Colon Polyps  Polyps are tissue growths inside the body. Polyps can grow in many places, including the large intestine (colon). A polyp may be a round bump or a mushroom-shaped growth. You could have one polyp or several. Most colon polyps are noncancerous (benign). However, some colon polyps can become cancerous over time. Finding and removing the polyps early can help prevent this. What are the causes? The exact cause of colon polyps is not known. What increases the risk? You are more likely to develop this condition if you:  Have a family history of colon cancer or colon polyps.  Are older than 66 or older than 45 if you are African American.  Have inflammatory bowel  disease, such as ulcerative colitis or Crohn's disease.  Have certain hereditary conditions, such as: ? Familial adenomatous polyposis. ? Lynch syndrome. ? Turcot syndrome. ? Peutz-Jeghers syndrome.  Are overweight.  Smoke cigarettes.  Do not get enough exercise.  Drink too much alcohol.  Eat a diet that is high in fat and red meat and low in fiber.  Had childhood cancer that was treated with abdominal radiation. What are the signs or symptoms? Most polyps do not cause symptoms. If you have symptoms, they may  include:  Blood coming from your rectum when having a bowel movement.  Blood in your stool. The stool may look dark red or black.  Abdominal pain.  A change in bowel habits, such as constipation or diarrhea. How is this diagnosed? This condition is diagnosed with a colonoscopy. This is a procedure in which a lighted, flexible scope is inserted into the anus and then passed into the colon to examine the area. Polyps are sometimes found when a colonoscopy is done as part of routine cancer screening tests. How is this treated? Treatment for this condition involves removing any polyps that are found. Most polyps can be removed during a colonoscopy. Those polyps will then be tested for cancer. Additional treatment may be needed depending on the results of testing. Follow these instructions at home: Lifestyle  Maintain a healthy weight, or lose weight if recommended by your health care provider.  Exercise every day or as told by your health care provider.  Do not use any products that contain nicotine or tobacco, such as cigarettes and e-cigarettes. If you need help quitting, ask your health care provider.  If you drink alcohol, limit how much you have: ? 0-1 drink a day for women. ? 0-2 drinks a day for men.  Be aware of how much alcohol is in your drink. In the [place] one drink equals one 12 oz bottle of beer (355 mL), one 5 oz glass of wine (148 mL), or one 1 oz shot of hard liquor (44 mL). Eating and drinking   Eat foods that are high in fiber, such as fruits, vegetables, and whole grains.  Eat foods that are high in calcium and vitamin D, such as milk, cheese, yogurt, eggs, liver, fish, and broccoli.  Limit foods that are high in fat, such as fried foods and desserts.  Limit the amount of red meat and processed meat you eat, such as hot dogs, sausage, bacon, and lunch meats. General instructions  Keep all follow-up visits as told by your health care provider. This is  important. ? This includes having regularly scheduled colonoscopies. ? Talk to your health care provider about when you need a colonoscopy. Contact a health care provider if:  You have new or worsening bleeding during a bowel movement.  You have new or increased blood in your stool.  You have a change in bowel habits.  You lose weight for no known reason. Summary  Polyps are tissue growths inside the body. Polyps can grow in many places, including the colon.  Most colon polyps are noncancerous (benign), but some can become cancerous over time.  This condition is diagnosed with a colonoscopy.  Treatment for this condition involves removing any polyps that are found. Most polyps can be removed during a colonoscopy. This information is not intended to replace advice given to you by your health care provider. Make sure you discuss any questions you have with your health care provider. Document Revised: 07/08/2017 Document Reviewed: 07/08/2017  Elsevier Patient Education  [employer].

## 2019-05-17 NOTE — Op Note (Signed)
[facility] Patient Name: Mark Leonard Procedure Date: 05/17/2019 8:14 AM MRN: [id number] Date of Birth: 01/04/1951 Attending MD: Norvel Richards , MD CSN: [id number] Age: 69 Admit Type: Outpatient Procedure:                Colonoscopy Indications:              Screening for colorectal malignant neoplasm Providers:                Norvel Richards, MD, Janeece Riggers, RN, Nelma Rothman, Technician Referring MD:              Medicines:                Midazolam 5 mg IV, Meperidine 40 mg IV Complications:            No immediate complications. Estimated Blood Loss:     Estimated blood loss was minimal. Procedure:                Pre-Anesthesia Assessment:                           - Prior to the procedure, a History and Physical                            was performed, and patient medications and                            allergies were reviewed. The patient's tolerance of                            previous anesthesia was also reviewed. The risks                            and benefits of the procedure and the sedation                            options and risks were discussed with the patient.                            All questions were answered, and informed consent                            was obtained. Prior Anticoagulants: The patient has                            taken no previous anticoagulant or antiplatelet                            agents. ASA Grade Assessment: II - A patient with                            mild systemic disease. After reviewing the risks  and benefits, the patient was deemed in                            satisfactory condition to undergo the procedure.                           After obtaining informed consent, the colonoscope                            was passed under direct vision. Throughout the                            procedure, the patient's blood pressure, pulse, and               oxygen saturations were monitored continuously. The                            [id number] scope was introduced through                            the anus and advanced to the the cecum, identified                            by appendiceal orifice and ileocecal valve. The                            colonoscopy was performed without difficulty. The                            patient tolerated the procedure well. The quality                            of the bowel preparation was adequate. The                            ileocecal valve, appendiceal orifice, and rectum                            were photographed. Scope In: 8:44:18 AM Scope Out: 9:05:21 AM Scope Withdrawal Time: 0 hours 14 minutes 7 seconds  Total Procedure Duration: 0 hours 21 minutes 3 seconds  Findings:      The perianal and digital rectal examinations were normal.      Seven sessile polyps were found in the rectum, descending colon,       transverse colon and ascending colon. The polyps were 4 to 6 mm in size.       These polyps were removed with a cold snare. Resection and retrieval       were complete. Estimated blood loss was minimal.      The exam was otherwise without abnormality on direct and retroflexion       views. Impression:               - Seven 4 to 6 mm polyps in the rectum, in the  descending colon, in the transverse colon and in                            the ascending colon, removed with a cold snare.                            Resected and retrieved.                           - The examination was otherwise normal on direct                            and retroflexion views. Moderate Sedation:      Moderate (conscious) sedation was administered by the endoscopy nurse       and supervised by the endoscopist. The following parameters were       monitored: oxygen saturation, heart rate, blood pressure, respiratory       rate, EKG, adequacy of pulmonary  ventilation, and response to care.       Total physician intraservice time was 23 minutes. Recommendation:           - Patient has a contact number available for                            emergencies. The signs and symptoms of potential                            delayed complications were discussed with the                            patient. Return to normal activities tomorrow.                            Written discharge instructions were provided to the                            patient.                           - Resume previous diet.                           - Continue present medications.                           - Repeat colonoscopy date to be determined after                            pending pathology results are reviewed for                            surveillance based on pathology results.                           - Return to GI office (date not yet determined). Procedure Code(s):        --- Professional ---  [phone], Colonoscopy, flexible; with removal of                            tumor(s), polyp(s), or other lesion(s) by snare                            technique                           99153, Moderate sedation; each additional 15                            minutes intraservice time                           G0500, Moderate sedation services provided by the                            same physician or other qualified health care                            professional performing a gastrointestinal                            endoscopic service that sedation supports,                            requiring the presence of an independent trained                            observer to assist in the monitoring of the                            patient's level of consciousness and physiological                            status; initial 15 minutes of intra-service time;                            patient age 64 years or older (additional time may                             be reported with [phone], as appropriate) Diagnosis Code(s):        --- Professional ---                           Z12.11, Encounter for screening for malignant                            neoplasm of colon                           K62.1, Rectal polyp                           K63.5, Polyp of colon CPT  copyright 2019 American Medical Association. All rights reserved. The codes documented in this report are preliminary and upon coder review may  be revised to meet current compliance requirements. Cristopher Estimable. Mathhew Buysse, MD Norvel Richards, MD 05/17/2019 9:17:53 AM This report has been signed electronically. Number of Addenda: 0

## 2019-05-18 ENCOUNTER — Encounter: Payer: Self-pay | Admitting: Internal Medicine

## 2019-05-18 LAB — SURGICAL PATHOLOGY

## 2019-06-13 ENCOUNTER — Telehealth: Payer: Self-pay | Admitting: Family Medicine

## 2019-06-13 MED ORDER — ALLOPURINOL 300 MG PO TABS: 300.0000 mg | ORAL_TABLET | Freq: Every day | ORAL | 1 refills | Status: DC

## 2019-06-13 NOTE — Telephone Encounter (Signed)
Six mo worth 

## 2019-06-13 NOTE — Addendum Note (Signed)
Addended by: Vicente Males on: 06/13/2019 04:33 PM   Modules accepted: Orders

## 2019-06-13 NOTE — Telephone Encounter (Signed)
[facility] pharmacy requesting refill on Allopurinol 300 mg tablet; take one tablet po daily. Pt last seen 04/12/2019 for rhinosinusitis. Please advise. Thank you

## 2019-06-13 NOTE — Telephone Encounter (Signed)
Medication sent to pharmacy  

## 2019-07-17 ENCOUNTER — Ambulatory Visit (INDEPENDENT_AMBULATORY_CARE_PROVIDER_SITE_OTHER): Payer: PPO | Admitting: Family Medicine

## 2019-07-17 ENCOUNTER — Other Ambulatory Visit: Payer: Self-pay

## 2019-07-17 ENCOUNTER — Encounter: Payer: Self-pay | Admitting: Family Medicine

## 2019-07-17 VITALS — BP 136/84 | Temp 97.4°F | Wt 213.4 lb

## 2019-07-17 DIAGNOSIS — E119 Type 2 diabetes mellitus without complications: Secondary | ICD-10-CM

## 2019-07-17 DIAGNOSIS — K760 Fatty (change of) liver, not elsewhere classified: Secondary | ICD-10-CM | POA: Diagnosis not present

## 2019-07-17 DIAGNOSIS — I10 Essential (primary) hypertension: Secondary | ICD-10-CM | POA: Diagnosis not present

## 2019-07-17 LAB — POCT GLYCOSYLATED HEMOGLOBIN (HGB A1C): Hemoglobin A1C: 5.6 % (ref 4.0–5.6)

## 2019-07-17 MED ORDER — ESOMEPRAZOLE MAGNESIUM 40 MG PO CPDR: DELAYED_RELEASE_CAPSULE | ORAL | 1 refills | Status: DC

## 2019-07-17 MED ORDER — HYDROCHLOROTHIAZIDE 25 MG PO TABS: 12.5000 mg | ORAL_TABLET | Freq: Every day | ORAL | 1 refills | Status: DC

## 2019-07-17 MED ORDER — ALLOPURINOL 300 MG PO TABS: 300.0000 mg | ORAL_TABLET | Freq: Every day | ORAL | 1 refills | Status: DC

## 2019-07-17 MED ORDER — ENALAPRIL MALEATE 20 MG PO TABS: 20.0000 mg | ORAL_TABLET | Freq: Every day | ORAL | 1 refills | Status: DC

## 2019-07-17 MED ORDER — SHINGRIX 50 MCG/0.5ML IM SUSR: 0.5000 mL | Freq: Once | INTRAMUSCULAR | 1 refills | Status: AC

## 2019-07-17 MED ORDER — FINASTERIDE 5 MG PO TABS: 5.0000 mg | ORAL_TABLET | Freq: Every day | ORAL | 1 refills | Status: DC

## 2019-07-17 NOTE — Progress Notes (Signed)
Subjective:    Patient ID: Mark Leonard, male    DOB: 14-Mar-1951, 69 y.o.   MRN: [id number]  Hypertension This is a chronic problem. Risk factors for coronary artery disease include diabetes mellitus. There are no compliance problems.   Diabetes He presents for his follow-up diabetic visit. He has type 2 diabetes mellitus. There are no hypoglycemic associated symptoms. There are no diabetic associated symptoms. There are no hypoglycemic complications. There are no diabetic complications. Risk factors for coronary artery disease include hypertension and male sex. (100-110) He does not see a podiatrist.Eye exam is current.   Pt is checking blood sugar about 3 times a week and blood pressure once a week.   Both patient and wife need Shingrix prescription.   Results for orders placed or performed during the hospital encounter of 05/17/19  Glucose, capillary  Result Value Ref Range   Glucose-Capillary 131 (H) 70 - 99 mg/dL  Surgical pathology  Result Value Ref Range   SURGICAL PATHOLOGY      SURGICAL PATHOLOGY CASE: [id number] PATIENT: Mark Leonard Surgical Pathology Report     Clinical History: surveillance     FINAL MICROSCOPIC DIAGNOSIS:  A. COLON, ASCENDING, POLYPECTOMY: - Tubular adenoma (2). - No high-grade dysplasia or carcinoma.  B. COLON, TRANSVERSE, POLYPECTOMY: - Tubular adenoma (2). - No high-grade dysplasia or carcinoma.  C. COLON, DESCENDING, POLYPECTOMY: - Tubular adenoma. - No high-grade dysplasia or carcinoma.  D. RECTUM, POLYPECTOMY: - Hyperplastic polyp (s). - No adenomatous change or carcinoma.  GROSS DESCRIPTION: A: Received in formalin are tan, soft tissue fragments that are submitted in toto. Number: 2 size: 0.4 and 0.6 cm blocks: 1 B: Received in formalin are tan, soft tissue fragments that are submitted in toto. Number: 2 size: 0.3 and 0.4 cm blocks: 1 C: Received in formalin is a tan, soft tissue fragment that is submitted in  toto.  Size: 0.4 cm, 1 block submitted. D: Received in formalin are tan, soft tis sue fragments that are submitted in toto. Number: 2 size: 0.3 and 0.5 cm blocks: 1 Craig Staggers 05/18/2019)  Final Diagnosis performed by Claudette Laws, MD.   Electronically signed 05/18/2019 Technical component performed at [facility], [place] [address] [place], [place] [address].  Professional component performed at [facility], [place] [address] [place], [place] [address].  Immunohistochemistry Technical component (if applicable) was performed at [facility]. [address], [place], [place], [place] [address].   IMMUNOHISTOCHEMISTRY DISCLAIMER (if applicable): Some of these immunohistochemical stains may have been developed and the performance characteristics determine by [facility]. Some may not have been cleared or approved by the U.S. Food and Drug Administration. The FDA has determined that such clearance or approval is not necessary. This test is used for clinical purposes. It should not be regarded as  investigational or for research. This laboratory is certified under the [place] (CLIA-88) as qualified to perform high complexity clinical laboratory testing.  The controls stained appropriately.    Sugar dropped really low one morning.  Best [place] does work well  Urinating overall in decent control on Proscar.  Patient takes allopurinol for gout suppression  No residual symptoms of October Covid  Review of Systems No headache, no major weight loss or weight gain, no chest pain no back pain abdominal pain no change in bowel habits complete ROS otherwise negative     Objective:   Physical Exam  Alert and oriented, vitals reviewed  and stable, NAD ENT-TM's and ext canals WNL bilat via otoscopic exam Soft palate, tonsils and post pharynx WNL via oropharyngeal exam Neck-symmetric, no  masses; thyroid nonpalpable and nontender Pulmonary-no tachypnea or accessory muscle use; Clear without wheezes via auscultation Card--no abnrml murmurs, rhythm reg and rate WNL Carotid pulses symmetric, without bruits       Assessment & Plan:  Impression 1 type 2 diabetes.  Discussed.  A1c excellent maintain same meds  2.  Hypertension good control discussed maintain same meds  3.  Prostate hypertrophy clinically stable  4.  Gout.  Stable compliant meds  Follow-up in 6 months.  Shingles vaccine given.  Diet exercise discussed

## 2019-11-20 DIAGNOSIS — L814 Other melanin hyperpigmentation: Secondary | ICD-10-CM | POA: Diagnosis not present

## 2019-11-20 DIAGNOSIS — L570 Actinic keratosis: Secondary | ICD-10-CM | POA: Diagnosis not present

## 2019-11-20 DIAGNOSIS — L280 Lichen simplex chronicus: Secondary | ICD-10-CM | POA: Diagnosis not present

## 2019-11-20 DIAGNOSIS — L719 Rosacea, unspecified: Secondary | ICD-10-CM | POA: Diagnosis not present

## 2019-11-22 ENCOUNTER — Telehealth: Payer: Self-pay | Admitting: Family Medicine

## 2019-11-22 DIAGNOSIS — Z0000 Encounter for general adult medical examination without abnormal findings: Secondary | ICD-10-CM

## 2019-11-22 DIAGNOSIS — I10 Essential (primary) hypertension: Secondary | ICD-10-CM

## 2019-11-22 DIAGNOSIS — E119 Type 2 diabetes mellitus without complications: Secondary | ICD-10-CM

## 2019-11-22 NOTE — Telephone Encounter (Signed)
Pt has physical on 10/25 and would like lab work done before appt.

## 2019-11-22 NOTE — Telephone Encounter (Signed)
Last labs oct 2020 a1c, lipid, liver, bmp, psa, mircroalb urine

## 2019-11-23 NOTE — Telephone Encounter (Signed)
Pls order cbc, cmp,  lipids, a1c. Thx, dr. Darene Lamer

## 2019-11-24 NOTE — Telephone Encounter (Signed)
Orders put in and pt notified.  

## 2019-12-18 ENCOUNTER — Telehealth: Payer: Self-pay | Admitting: Family Medicine

## 2019-12-18 NOTE — Telephone Encounter (Signed)
Can pt come to office to check bp and not to take 1/2 tab of enalapril (10mg  daily) and check bp and come in Tuesday if possible.  Thx. Dr. Lovena Le

## 2019-12-18 NOTE — Telephone Encounter (Signed)
Pt concerned because he was on enalapril 20mg  one bid and bp dropped and dr Richardson Landry said take one daily and pt states he takes one qam. States bottle says take at night. Checked bp 88/56 when checked a few minutes ago. States he is feeling good. States last Wednesday everything started going black and he had to sit down. Also takes one half of hctz in the morning. States this does not happen every day. He is checking it with a wrist monitor and he knows they are not as accurate. Bp on day he skipped pill it was 137/90.  Notices he feels a drop when he goes from sitting to standing.

## 2019-12-18 NOTE — Telephone Encounter (Signed)
Pt has question about his blood pressure med enalapril (VASOTEC) 20 MG tablet [[id number]] ENDED taking it in morning but the bottle says at night. Pt switch back to morning  but the blood pressure is dropping during the day or go back to taking his meds at night. Pt has been taking in the morning for a year now and it just has recently started dropping. Does not drop every day.  Pt call [phone]

## 2019-12-19 NOTE — Telephone Encounter (Signed)
Pt states he cannot come in til October 4th because his 67 year old mom is with him til then and he can't leave her alone. He states he will skip pill tomorrow morning and then check bp at home and call us with the reading. Await call back.

## 2019-12-20 ENCOUNTER — Telehealth: Payer: Self-pay | Admitting: Family Medicine

## 2019-12-20 NOTE — Telephone Encounter (Signed)
Patient wanted to update you on his Blood pressure for today  Is was 137/70 at 10 am and 126/66 at 4 pm.

## 2020-01-09 DIAGNOSIS — I10 Essential (primary) hypertension: Secondary | ICD-10-CM | POA: Diagnosis not present

## 2020-01-09 DIAGNOSIS — E119 Type 2 diabetes mellitus without complications: Secondary | ICD-10-CM | POA: Diagnosis not present

## 2020-01-09 DIAGNOSIS — Z0000 Encounter for general adult medical examination without abnormal findings: Secondary | ICD-10-CM | POA: Diagnosis not present

## 2020-01-10 LAB — LIPID PANEL
Chol/HDL Ratio: 2.4 ratio (ref 0.0–5.0)
Cholesterol, Total: 93 mg/dL — ABNORMAL LOW (ref 100–199)
HDL: 39 mg/dL — ABNORMAL LOW (ref >39)
LDL Chol Calc (NIH): 40 mg/dL (ref 0–99)
Triglycerides: 61 mg/dL (ref 0–149)
VLDL Cholesterol Cal: 14 mg/dL (ref 5–40)

## 2020-01-10 LAB — COMPREHENSIVE METABOLIC PANEL
ALT: 53 IU/L — ABNORMAL HIGH (ref 0–44)
AST: 51 IU/L — ABNORMAL HIGH (ref 0–40)
Albumin/Globulin Ratio: 1.4 (ref 1.2–2.2)
Albumin: 4.2 g/dL (ref 3.8–4.8)
Alkaline Phosphatase: 129 IU/L — ABNORMAL HIGH (ref 44–121)
BUN/Creatinine Ratio: 15 (ref 10–24)
BUN: 18 mg/dL (ref 8–27)
Bilirubin Total: 0.4 mg/dL (ref 0.0–1.2)
CO2: 27 mmol/L (ref 20–29)
Calcium: 9.8 mg/dL (ref 8.6–10.2)
Chloride: 104 mmol/L (ref 96–106)
Creatinine, Ser: 1.22 mg/dL (ref 0.76–1.27)
GFR calc Af Amer: 70 mL/min/{1.73_m2} (ref >59)
GFR calc non Af Amer: 61 mL/min/{1.73_m2} (ref >59)
Globulin, Total: 3 g/dL (ref 1.5–4.5)
Glucose: 115 mg/dL — ABNORMAL HIGH (ref 65–99)
Potassium: 5 mmol/L (ref 3.5–5.2)
Sodium: 142 mmol/L (ref 134–144)
Total Protein: 7.2 g/dL (ref 6.0–8.5)

## 2020-01-10 LAB — CBC WITH DIFFERENTIAL/PLATELET
Basophils Absolute: 0.1 10*3/uL (ref 0.0–0.2)
Basos: 1 %
EOS (ABSOLUTE): 0.2 10*3/uL (ref 0.0–0.4)
Eos: 3 %
Hematocrit: 41.9 % (ref 37.5–51.0)
Hemoglobin: 15 g/dL (ref 13.0–17.7)
Immature Grans (Abs): 0 10*3/uL (ref 0.0–0.1)
Immature Granulocytes: 0 %
Lymphocytes Absolute: 1.8 10*3/uL (ref 0.7–3.1)
Lymphs: 28 %
MCH: 34.2 pg — ABNORMAL HIGH (ref 26.6–33.0)
MCHC: 35.8 g/dL — ABNORMAL HIGH (ref 31.5–35.7)
MCV: 96 fL (ref 79–97)
Monocytes Absolute: 0.7 10*3/uL (ref 0.1–0.9)
Monocytes: 11 %
Neutrophils Absolute: 3.6 10*3/uL (ref 1.4–7.0)
Neutrophils: 57 %
Platelets: 144 10*3/uL — ABNORMAL LOW (ref 150–450)
RBC: 4.38 x10E6/uL (ref 4.14–5.80)
RDW: 14.5 % (ref 11.6–15.4)
WBC: 6.3 10*3/uL (ref 3.4–10.8)

## 2020-01-10 LAB — HEMOGLOBIN A1C
Est. average glucose Bld gHb Est-mCnc: 137 mg/dL
Hgb A1c MFr Bld: 6.4 % — ABNORMAL HIGH (ref 4.8–5.6)

## 2020-01-23 DIAGNOSIS — H524 Presbyopia: Secondary | ICD-10-CM | POA: Diagnosis not present

## 2020-01-29 ENCOUNTER — Encounter: Payer: Self-pay | Admitting: Family Medicine

## 2020-01-29 ENCOUNTER — Other Ambulatory Visit: Payer: Self-pay

## 2020-01-29 ENCOUNTER — Ambulatory Visit (INDEPENDENT_AMBULATORY_CARE_PROVIDER_SITE_OTHER): Payer: PPO | Admitting: Family Medicine

## 2020-01-29 VITALS — BP 138/90 | HR 91 | Temp 97.3°F | Ht 67.0 in | Wt 218.0 lb

## 2020-01-29 DIAGNOSIS — Z0000 Encounter for general adult medical examination without abnormal findings: Secondary | ICD-10-CM

## 2020-01-29 DIAGNOSIS — E119 Type 2 diabetes mellitus without complications: Secondary | ICD-10-CM

## 2020-01-29 DIAGNOSIS — Z23 Encounter for immunization: Secondary | ICD-10-CM

## 2020-01-29 DIAGNOSIS — I10 Essential (primary) hypertension: Secondary | ICD-10-CM

## 2020-01-29 DIAGNOSIS — K760 Fatty (change of) liver, not elsewhere classified: Secondary | ICD-10-CM

## 2020-01-29 MED ORDER — ESOMEPRAZOLE MAGNESIUM 40 MG PO CPDR: DELAYED_RELEASE_CAPSULE | ORAL | 1 refills | Status: DC

## 2020-01-29 MED ORDER — CHLORZOXAZONE 500 MG PO TABS: 500.0000 mg | ORAL_TABLET | Freq: Three times a day (TID) | ORAL | 0 refills | Status: DC | PRN

## 2020-01-29 MED ORDER — GLUCOSE BLOOD VI STRP: ORAL_STRIP | 12 refills | Status: AC

## 2020-01-29 MED ORDER — ALLOPURINOL 300 MG PO TABS: 300.0000 mg | ORAL_TABLET | Freq: Every day | ORAL | 1 refills | Status: DC

## 2020-01-29 MED ORDER — FINASTERIDE 5 MG PO TABS: 5.0000 mg | ORAL_TABLET | Freq: Every day | ORAL | 1 refills | Status: DC

## 2020-01-29 MED ORDER — ENALAPRIL MALEATE 20 MG PO TABS: 10.0000 mg | ORAL_TABLET | Freq: Every day | ORAL | 1 refills | Status: DC

## 2020-01-29 NOTE — Patient Instructions (Addendum)
Start 10mg  enalapril today.  Call with numbers in the next week, if seeing numbers over 150/90 call sooner.  Goal around 120/80  Decrease carbs and sweets Increase walking 46mins 4-5x per week.

## 2020-01-29 NOTE — Progress Notes (Signed)
Patient ID: Mark Leonard, male    DOB: [birth year], 69 y.o.   MRN: [id number]   Chief Complaint  Patient presents with  . Annual Exam   Subjective:    HPI AWV- Annual Wellness Visit  The patient was seen for their annual wellness visit. The patient's past medical history, surgical history, and family history were reviewed. Pertinent vaccines were reviewed ( tetanus, pneumonia, shingles, flu) The patient's medication list was reviewed and updated.  The height and weight were entered.  BMI recorded in electronic record elsewhere  Cognitive screening was completed. Outcome of Mini - Cog: pass   Falls /depression screening electronically recorded within record elsewhere  Current tobacco usage: none (All patients who use tobacco were given written and verbal information on quitting)  Recent listing of emergency department/hospitalizations over the past year were reviewed.  current specialist the patient sees on a regular basis: none   Medicare annual wellness visit patient questionnaire was reviewed.  A written screening schedule for the patient for the next 5-10 years was given. Appropriate discussion of followup regarding next visit was discussed.  Would like a senior flu vaccine.   Would like to get refill on one touch verio test strips to [facility].  Needs refill on chlorzoxazone to [facility] [place].   bp has been high since enalapril has been stopped back in September.   Had some low bp for 2 days in sept. 07-62U systolic, and was seeing it in April this year.  Doing it more often and was getting low more frequently.  Pt called here and we advised to stop the enalapril and come in to see [place].   So pt didn't follow up right away as discussed and pt had to reschedule appt.  Seeing 140-160s, mostly since then. Pt was taking both in am enalapril 68m and hctz 254m Working nights so needing to take during the day.  occ hand swelling and leg swelling, but resolves as day  goes on. Has been going on for yrs. No chest pain, sob, or blurry vision.  DM2- diet modification. a1c elevated at 6.4 and was at 5.6. Eating more junk food and not getting out. Pt stating lost 57 lbs and worked on it, when it got high a few yrs ago. Pt was told needing medications and was able to lose weight w/o going on meds.  Went to dmNews CorporationHas been on weight watchers in the past. Inc in candy, cookies, cakes, pies recently. Not exercising.  Elevated lfts- slight elevation. H/o fatty liver disease.  Seeing urology- has appt this week to f/u on the rt kidney stone. Has h/o kidney stone 71m37mn the rt side. Rt sided back pain, intermittent. No blood in urine.  Has back spasms occ using chlorzoxazone.  Carrying mother due to helping with mother. Just taking prn.   H/o gout under control with allopurinol.  Medical History RogHovaness a past medical history of Acid reflux, Allergy, Bradycardia, Chronic kidney disease, Fatty liver, Gout, Heart murmur, Impaired fasting glucose, Mitral regurgitation, Prostate hypertrophy, and Venous stasis.   Outpatient Encounter Medications as of 01/29/2020  Medication Sig  . allopurinol (ZYLOPRIM) 300 MG tablet Take 1 tablet (300 mg total) by mouth daily.  . blood glucose meter kit and supplies KIT Dispense based on patient and insurance preference. Use daily as directed. (FOR ICD-10 code E11.9)  . chlorzoxazone (PARAFON) 500 MG tablet Take 1 tablet (500 mg total) by mouth 3 (three) times daily as needed.  .Marland Kitchen  esomeprazole (NEXIUM) 40 MG capsule TAKE 1 CAPSULE DAILY BEFOREBREAKFAST  . finasteride (PROSCAR) 5 MG tablet Take 1 tablet (5 mg total) by mouth daily.  . fluticasone (FLONASE) 50 MCG/ACT nasal spray Place 2 sprays into both nostrils daily. (Patient taking differently: Place 2 sprays into both nostrils daily as needed for allergies. )  . glucose blood test strip Use to test blood sugar once daily Use as instructed. Dx:E11.9  .  hydrochlorothiazide (HYDRODIURIL) 25 MG tablet Take 0.5 tablets (12.5 mg total) by mouth daily.  . metroNIDAZOLE (METROCREAM) 0.75 % cream APPLY CREAM TOPICALLY TWICE DAILY AS NEEDED (Patient taking differently: Apply 1 application topically every other day. In the evening)  . Multiple Vitamins-Minerals (MULTIVITAMIN MEN 50+ PO) Take 1 tablet by mouth daily.   . [DISCONTINUED] allopurinol (ZYLOPRIM) 300 MG tablet Take 1 tablet (300 mg total) by mouth daily.  . [DISCONTINUED] chlorzoxazone (PARAFON) 500 MG tablet TAKE 1 TABLET BY MOUTH THREE TIMES DAILY AS NEEDED (Patient taking differently: Take 500 mg by mouth daily as needed for muscle spasms. )  . [DISCONTINUED] esomeprazole (NEXIUM) 40 MG capsule TAKE 1 CAPSULE DAILY BEFOREBREAKFAST  . [DISCONTINUED] finasteride (PROSCAR) 5 MG tablet Take 1 tablet (5 mg total) by mouth daily.  . enalapril (VASOTEC) 20 MG tablet Take 0.5 tablets (10 mg total) by mouth at bedtime.  . [DISCONTINUED] enalapril (VASOTEC) 20 MG tablet Take 1 tablet (20 mg total) by mouth at bedtime. (Patient not taking: Reported on 01/29/2020)   No facility-administered encounter medications on file as of 01/29/2020.     Review of Systems  Constitutional: Negative for chills and fever.  HENT: Negative for congestion, rhinorrhea and sore throat.   Respiratory: Negative for cough, shortness of breath and wheezing.   Cardiovascular: Negative for chest pain and leg swelling.  Gastrointestinal: Negative for abdominal pain, diarrhea, nausea and vomiting.  Genitourinary: Negative for dysuria and frequency.  Skin: Negative for rash.  Neurological: Negative for dizziness, weakness and headaches.     Vitals BP 138/90   Pulse 91   Temp (!) 97.3 F (36.3 C)   Ht 5' 7"  (1.702 m)   Wt 218 lb (98.9 kg)   SpO2 97%   BMI 34.14 kg/m   Objective:   Physical Exam Vitals and nursing note reviewed.  Constitutional:      General: He is not in acute distress.    Appearance: Normal  appearance. He is obese. He is not ill-appearing.  HENT:     Head: Normocephalic.     Nose: Nose normal. No congestion.     Mouth/Throat:     Mouth: Mucous membranes are moist.     Pharynx: No oropharyngeal exudate.  Eyes:     Extraocular Movements: Extraocular movements intact.     Conjunctiva/sclera: Conjunctivae normal.     Pupils: Pupils are equal, round, and reactive to light.  Cardiovascular:     Rate and Rhythm: Normal rate and regular rhythm.     Pulses: Normal pulses.     Heart sounds: Normal heart sounds. No murmur heard.   Pulmonary:     Effort: Pulmonary effort is normal.     Breath sounds: Normal breath sounds. No wheezing, rhonchi or rales.  Abdominal:     General: Bowel sounds are normal.     Palpations: Abdomen is soft.  Musculoskeletal:        General: No swelling or tenderness. Normal range of motion.     Right lower leg: No edema.     Left  lower leg: No edema.  Skin:    General: Skin is warm and dry.     Findings: No rash.  Neurological:     General: No focal deficit present.     Mental Status: He is alert and oriented to person, place, and time.     Cranial Nerves: No cranial nerve deficit.  Psychiatric:        Mood and Affect: Mood normal.        Behavior: Behavior normal.        Thought Content: Thought content normal.        Judgment: Judgment normal.      Assessment and Plan   1. Medicare annual wellness visit, subsequent  2. Type 2 diabetes mellitus without complication, without long-term current use of insulin ([place])  3. Fatty liver  4. Essential hypertension, benign   htn- suboptimal- increased the enalapril to 55m from 230mand cont with 12.5 mg hctz. Pt to record bp and call back next week with numbers.   dm2- worsening, still under a1c 7.0., elevated a1c from 5.6 to 6.4. pt declining to start medications. pt to work on deVF Corporationnd sweets, and increase in exercising.  Recheck in 14m4moFatty liver- reviewed exercising and dec  weight to help with liver enzymes.  HM- Flu vaccine given.   F/u 14mo23moprn.

## 2020-02-09 ENCOUNTER — Other Ambulatory Visit: Payer: Self-pay | Admitting: *Deleted

## 2020-02-09 MED ORDER — ENALAPRIL MALEATE 20 MG PO TABS: 10.0000 mg | ORAL_TABLET | Freq: Every day | ORAL | 1 refills | Status: DC

## 2020-02-09 MED ORDER — HYDROCHLOROTHIAZIDE 25 MG PO TABS: 12.5000 mg | ORAL_TABLET | Freq: Every day | ORAL | 1 refills | Status: DC

## 2020-02-10 ENCOUNTER — Encounter: Payer: Self-pay | Admitting: Family Medicine

## 2020-02-13 ENCOUNTER — Other Ambulatory Visit: Payer: Self-pay | Admitting: *Deleted

## 2020-02-13 MED ORDER — ENALAPRIL MALEATE 20 MG PO TABS: 20.0000 mg | ORAL_TABLET | Freq: Every day | ORAL | 1 refills | Status: DC

## 2020-03-11 DIAGNOSIS — Z125 Encounter for screening for malignant neoplasm of prostate: Secondary | ICD-10-CM | POA: Diagnosis not present

## 2020-03-11 DIAGNOSIS — R3911 Hesitancy of micturition: Secondary | ICD-10-CM | POA: Diagnosis not present

## 2020-03-11 DIAGNOSIS — K760 Fatty (change of) liver, not elsewhere classified: Secondary | ICD-10-CM | POA: Diagnosis not present

## 2020-03-11 DIAGNOSIS — N200 Calculus of kidney: Secondary | ICD-10-CM | POA: Diagnosis not present

## 2020-03-14 ENCOUNTER — Other Ambulatory Visit: Payer: Self-pay | Admitting: Urology

## 2020-03-23 ENCOUNTER — Other Ambulatory Visit (HOSPITAL_COMMUNITY): Payer: PPO

## 2020-03-25 ENCOUNTER — Other Ambulatory Visit (HOSPITAL_COMMUNITY)
Admission: RE | Admit: 2020-03-25 | Discharge: 2020-03-25 | Disposition: A | Discharge status: Home / Self Care | Payer: PPO | Source: Ambulatory Visit | Attending: Urology | Admitting: Urology

## 2020-03-25 DIAGNOSIS — Z01812 Encounter for preprocedural laboratory examination: Secondary | ICD-10-CM | POA: Insufficient documentation

## 2020-03-25 DIAGNOSIS — Z20822 Contact with and (suspected) exposure to covid-19: Secondary | ICD-10-CM | POA: Insufficient documentation

## 2020-03-25 LAB — SARS CORONAVIRUS 2 (TAT 6-24 HRS): SARS Coronavirus 2: NEGATIVE

## 2020-04-09 ENCOUNTER — Encounter (HOSPITAL_BASED_OUTPATIENT_CLINIC_OR_DEPARTMENT_OTHER): Payer: Self-pay | Admitting: Urology

## 2020-04-09 ENCOUNTER — Other Ambulatory Visit: Payer: Self-pay

## 2020-04-09 NOTE — Progress Notes (Signed)
Spoke w/ via phone for pre-op interview---pt Lab needs dos----  I stat , ekg             Lab results------none COVID test ------04-10-2020 at 1000 am Arrive at -------615 am 04-12-2020 NPO after MN NO Solid Food.  Clear liquids from MN until---515 am then npo Medications to take morning of surgery -----allopurinol, proscar, nexium Diabetic medication -----n/a Patient Special Instructions -----none Pre-Op special Istructions -----none Patient verbalized understanding of instructions that were given at this phone interview. Patient denies shortness of breath, chest pain, fever, cough at this phone interview.

## 2020-04-10 ENCOUNTER — Other Ambulatory Visit (HOSPITAL_COMMUNITY)
Admission: RE | Admit: 2020-04-10 | Discharge: 2020-04-10 | Disposition: A | Discharge status: Home / Self Care | Payer: PPO | Source: Ambulatory Visit | Attending: Urology | Admitting: Urology

## 2020-04-10 DIAGNOSIS — Z01812 Encounter for preprocedural laboratory examination: Secondary | ICD-10-CM | POA: Diagnosis not present

## 2020-04-10 DIAGNOSIS — Z20822 Contact with and (suspected) exposure to covid-19: Secondary | ICD-10-CM | POA: Diagnosis not present

## 2020-04-10 LAB — SARS CORONAVIRUS 2 (TAT 6-24 HRS): SARS Coronavirus 2: NEGATIVE

## 2020-04-11 NOTE — Anesthesia Preprocedure Evaluation (Addendum)
Anesthesia Evaluation  Patient identified by MRN, date of birth, ID band Patient awake    Reviewed: Allergy & Precautions, NPO status , Patient's Chart, lab work & pertinent test results  History of Anesthesia Complications Negative for: history of anesthetic complications  Airway Mallampati: II  TM Distance: >3 FB Neck ROM: Full    Dental  (+) Dental Advisory Given   Pulmonary former smoker,    Pulmonary exam normal        Cardiovascular hypertension, Pt. on medications Normal cardiovascular exam     Neuro/Psych negative neurological ROS  negative psych ROS   GI/Hepatic Neg liver ROS, GERD  Medicated and Controlled,  Endo/Other  diabetes (no meds), Type 2 Obesity   Renal/GU negative Renal ROS     Musculoskeletal  Gout    Abdominal   Peds  Hematology negative hematology ROS (+)   Anesthesia Other Findings Covid+ 04/2019 Covid test negative   Reproductive/Obstetrics                            Anesthesia Physical Anesthesia Plan  ASA: II  Anesthesia Plan: General   Post-op Pain Management:    Induction: Intravenous  PONV Risk Score and Plan: 2 and Treatment may vary due to age or medical condition, Ondansetron and Dexamethasone  Airway Management Planned: LMA  Additional Equipment: None  Intra-op Plan:   Post-operative Plan: Extubation in OR  Informed Consent: I have reviewed the patients History and Physical, chart, labs and discussed the procedure including the risks, benefits and alternatives for the proposed anesthesia with the patient or authorized representative who has indicated his/her understanding and acceptance.     Dental advisory given  Plan Discussed with: CRNA and Anesthesiologist  Anesthesia Plan Comments:        Anesthesia Quick Evaluation

## 2020-04-12 ENCOUNTER — Ambulatory Visit (HOSPITAL_BASED_OUTPATIENT_CLINIC_OR_DEPARTMENT_OTHER): Payer: PPO | Admitting: Anesthesiology

## 2020-04-12 ENCOUNTER — Ambulatory Visit (HOSPITAL_BASED_OUTPATIENT_CLINIC_OR_DEPARTMENT_OTHER)
Admission: RE | Admit: 2020-04-12 | Discharge: 2020-04-12 | Disposition: A | Discharge status: Home / Self Care | Payer: PPO | Attending: Urology | Admitting: Urology

## 2020-04-12 ENCOUNTER — Encounter (HOSPITAL_BASED_OUTPATIENT_CLINIC_OR_DEPARTMENT_OTHER): Payer: Self-pay | Admitting: Urology

## 2020-04-12 ENCOUNTER — Encounter (HOSPITAL_BASED_OUTPATIENT_CLINIC_OR_DEPARTMENT_OTHER)
Admission: RE | Disposition: A | Discharge status: Home / Self Care | Payer: Self-pay | Source: Home / Self Care | Attending: Urology

## 2020-04-12 DIAGNOSIS — Z8616 Personal history of COVID-19: Secondary | ICD-10-CM | POA: Insufficient documentation

## 2020-04-12 DIAGNOSIS — N210 Calculus in bladder: Secondary | ICD-10-CM | POA: Diagnosis not present

## 2020-04-12 DIAGNOSIS — Z9049 Acquired absence of other specified parts of digestive tract: Secondary | ICD-10-CM | POA: Insufficient documentation

## 2020-04-12 DIAGNOSIS — E119 Type 2 diabetes mellitus without complications: Secondary | ICD-10-CM | POA: Diagnosis not present

## 2020-04-12 DIAGNOSIS — Z87891 Personal history of nicotine dependence: Secondary | ICD-10-CM | POA: Diagnosis not present

## 2020-04-12 DIAGNOSIS — N400 Enlarged prostate without lower urinary tract symptoms: Secondary | ICD-10-CM | POA: Insufficient documentation

## 2020-04-12 DIAGNOSIS — E669 Obesity, unspecified: Secondary | ICD-10-CM | POA: Diagnosis not present

## 2020-04-12 DIAGNOSIS — Z888 Allergy status to other drugs, medicaments and biological substances status: Secondary | ICD-10-CM | POA: Diagnosis not present

## 2020-04-12 DIAGNOSIS — Z79899 Other long term (current) drug therapy: Secondary | ICD-10-CM | POA: Diagnosis not present

## 2020-04-12 DIAGNOSIS — Z6833 Body mass index (BMI) 33.0-33.9, adult: Secondary | ICD-10-CM | POA: Diagnosis not present

## 2020-04-12 DIAGNOSIS — N200 Calculus of kidney: Secondary | ICD-10-CM | POA: Insufficient documentation

## 2020-04-12 DIAGNOSIS — Z836 Family history of other diseases of the respiratory system: Secondary | ICD-10-CM | POA: Insufficient documentation

## 2020-04-12 DIAGNOSIS — Z87442 Personal history of urinary calculi: Secondary | ICD-10-CM | POA: Insufficient documentation

## 2020-04-12 DIAGNOSIS — I10 Essential (primary) hypertension: Secondary | ICD-10-CM | POA: Diagnosis not present

## 2020-04-12 DIAGNOSIS — Z8249 Family history of ischemic heart disease and other diseases of the circulatory system: Secondary | ICD-10-CM | POA: Insufficient documentation

## 2020-04-12 DIAGNOSIS — M109 Gout, unspecified: Secondary | ICD-10-CM | POA: Insufficient documentation

## 2020-04-12 HISTORY — DX: Prediabetes: R73.03

## 2020-04-12 HISTORY — PX: CYSTOSCOPY WITH RETROGRADE PYELOGRAM, URETEROSCOPY AND STENT PLACEMENT: SHX5789

## 2020-04-12 HISTORY — PX: CYSTOSCOPY WITH LITHOLAPAXY: SHX1425

## 2020-04-12 HISTORY — DX: Personal history of urinary calculi: Z87.442

## 2020-04-12 HISTORY — DX: Presence of spectacles and contact lenses: Z97.3

## 2020-04-12 HISTORY — DX: Essential (primary) hypertension: I10

## 2020-04-12 HISTORY — PX: HOLMIUM LASER APPLICATION: SHX5852

## 2020-04-12 LAB — POCT I-STAT, CHEM 8
BUN: 20 mg/dL (ref 8–23)
Calcium, Ion: 1.12 mmol/L — ABNORMAL LOW (ref 1.15–1.40)
Chloride: 105 mmol/L (ref 98–111)
Creatinine, Ser: 1.1 mg/dL (ref 0.61–1.24)
Glucose, Bld: 119 mg/dL — ABNORMAL HIGH (ref 70–99)
HCT: 46 % (ref 39.0–52.0)
Hemoglobin: 15.6 g/dL (ref 13.0–17.0)
Potassium: 4 mmol/L (ref 3.5–5.1)
Sodium: 140 mmol/L (ref 135–145)
TCO2: 23 mmol/L (ref 22–32)

## 2020-04-12 LAB — GLUCOSE, CAPILLARY: Glucose-Capillary: 159 mg/dL — ABNORMAL HIGH (ref 70–99)

## 2020-04-12 SURGERY — CYSTOURETEROSCOPY, WITH RETROGRADE PYELOGRAM AND STENT INSERTION
Anesthesia: General | Site: Renal

## 2020-04-12 MED ORDER — FENTANYL CITRATE (PF) 100 MCG/2ML IJ SOLN
INTRAMUSCULAR | Status: DC | PRN
  Administered 2020-04-12: 25 ug via INTRAVENOUS
  Administered 2020-04-12 (×2): 50 ug via INTRAVENOUS
  Administered 2020-04-12: 25 ug via INTRAVENOUS
  Administered 2020-04-12: 50 ug via INTRAVENOUS

## 2020-04-12 MED ORDER — PHENYLEPHRINE 40 MCG/ML (10ML) SYRINGE FOR IV PUSH (FOR BLOOD PRESSURE SUPPORT)
PREFILLED_SYRINGE | INTRAVENOUS | Status: DC | PRN
  Administered 2020-04-12 (×2): 40 ug via INTRAVENOUS

## 2020-04-12 MED ORDER — EPHEDRINE 5 MG/ML INJ
INTRAVENOUS | Status: AC
  Filled 2020-04-12: qty 10

## 2020-04-12 MED ORDER — FENTANYL CITRATE (PF) 100 MCG/2ML IJ SOLN
INTRAMUSCULAR | Status: AC
  Filled 2020-04-12: qty 2

## 2020-04-12 MED ORDER — PROPOFOL 10 MG/ML IV BOLUS
INTRAVENOUS | Status: DC | PRN
  Administered 2020-04-12: 180 mg via INTRAVENOUS

## 2020-04-12 MED ORDER — PROPOFOL 500 MG/50ML IV EMUL
INTRAVENOUS | Status: AC
  Filled 2020-04-12: qty 100

## 2020-04-12 MED ORDER — PROPOFOL 500 MG/50ML IV EMUL
INTRAVENOUS | Status: AC
  Filled 2020-04-12: qty 50

## 2020-04-12 MED ORDER — PROPOFOL 10 MG/ML IV BOLUS
INTRAVENOUS | Status: AC
  Filled 2020-04-12: qty 20

## 2020-04-12 MED ORDER — OXYCODONE HCL 5 MG PO TABS: 5.0000 mg | ORAL_TABLET | Freq: Once | ORAL | Status: DC | PRN

## 2020-04-12 MED ORDER — ONDANSETRON HCL 4 MG/2ML IJ SOLN
INTRAMUSCULAR | Status: AC
  Filled 2020-04-12: qty 2

## 2020-04-12 MED ORDER — LACTATED RINGERS IV SOLN: INTRAVENOUS | Status: DC

## 2020-04-12 MED ORDER — KETOROLAC TROMETHAMINE 10 MG PO TABS: 10.0000 mg | ORAL_TABLET | Freq: Three times a day (TID) | ORAL | 0 refills | Status: DC | PRN

## 2020-04-12 MED ORDER — LIDOCAINE HCL (PF) 2 % IJ SOLN
INTRAMUSCULAR | Status: AC
  Filled 2020-04-12: qty 5

## 2020-04-12 MED ORDER — EPHEDRINE SULFATE-NACL 50-0.9 MG/10ML-% IV SOSY
PREFILLED_SYRINGE | INTRAVENOUS | Status: DC | PRN
  Administered 2020-04-12 (×3): 5 mg via INTRAVENOUS

## 2020-04-12 MED ORDER — PHENYLEPHRINE 40 MCG/ML (10ML) SYRINGE FOR IV PUSH (FOR BLOOD PRESSURE SUPPORT)
PREFILLED_SYRINGE | INTRAVENOUS | Status: AC
  Filled 2020-04-12: qty 10

## 2020-04-12 MED ORDER — OXYCODONE-ACETAMINOPHEN 5-325 MG PO TABS: 1.0000 | ORAL_TABLET | Freq: Three times a day (TID) | ORAL | 0 refills | Status: DC | PRN

## 2020-04-12 MED ORDER — DEXAMETHASONE SODIUM PHOSPHATE 10 MG/ML IJ SOLN
INTRAMUSCULAR | Status: AC
  Filled 2020-04-12: qty 1

## 2020-04-12 MED ORDER — IOHEXOL 300 MG/ML  SOLN
INTRAMUSCULAR | Status: DC | PRN
  Administered 2020-04-12: 11 mL

## 2020-04-12 MED ORDER — CEPHALEXIN 500 MG PO CAPS: 500.0000 mg | ORAL_CAPSULE | Freq: Two times a day (BID) | ORAL | 0 refills | Status: DC

## 2020-04-12 MED ORDER — OXYCODONE HCL 5 MG/5ML PO SOLN: 5.0000 mg | Freq: Once | ORAL | Status: DC | PRN

## 2020-04-12 MED ORDER — DEXAMETHASONE SODIUM PHOSPHATE 4 MG/ML IJ SOLN
INTRAMUSCULAR | Status: DC | PRN
  Administered 2020-04-12: 10 mg via INTRAVENOUS

## 2020-04-12 MED ORDER — ONDANSETRON HCL 4 MG/2ML IJ SOLN: 4.0000 mg | Freq: Once | INTRAMUSCULAR | Status: DC | PRN

## 2020-04-12 MED ORDER — MIDAZOLAM HCL 2 MG/2ML IJ SOLN
INTRAMUSCULAR | Status: AC
  Filled 2020-04-12: qty 2

## 2020-04-12 MED ORDER — ONDANSETRON HCL 4 MG/2ML IJ SOLN
INTRAMUSCULAR | Status: DC | PRN
  Administered 2020-04-12: 4 mg via INTRAVENOUS

## 2020-04-12 MED ORDER — SODIUM CHLORIDE 0.9 % IR SOLN
Status: DC | PRN
  Administered 2020-04-12 (×4): 3000 mL

## 2020-04-12 MED ORDER — FENTANYL CITRATE (PF) 100 MCG/2ML IJ SOLN: 25.0000 ug | INTRAMUSCULAR | Status: DC | PRN

## 2020-04-12 MED ORDER — MIDAZOLAM HCL 5 MG/5ML IJ SOLN
INTRAMUSCULAR | Status: DC | PRN
  Administered 2020-04-12: 1 mg via INTRAVENOUS

## 2020-04-12 MED ORDER — LIDOCAINE 2% (20 MG/ML) 5 ML SYRINGE
INTRAMUSCULAR | Status: DC | PRN
  Administered 2020-04-12: 60 mg via INTRAVENOUS

## 2020-04-12 MED ORDER — GENTAMICIN SULFATE 40 MG/ML IJ SOLN
5.0000 mg/kg | INTRAVENOUS | Status: AC
  Administered 2020-04-12: 389.2 mg via INTRAVENOUS
  Filled 2020-04-12: qty 9.75

## 2020-04-12 SURGICAL SUPPLY — 27 items
BAG DRAIN URO-CYSTO SKYTR STRL (DRAIN) ×3 IMPLANT
BAG DRN UROCATH (DRAIN) ×2
BASKET LASER NITINOL 1.9FR (BASKET) ×1 IMPLANT
BSKT STON RTRVL 120 1.9FR (BASKET) ×2
CATH INTERMIT  6FR 70CM (CATHETERS) IMPLANT
CLOTH BEACON ORANGE TIMEOUT ST (SAFETY) ×3 IMPLANT
FIBER LASER FLEXIVA 365 (UROLOGICAL SUPPLIES) IMPLANT
GLOVE BIO SURGEON STRL SZ7.5 (GLOVE) ×3 IMPLANT
GLOVE BIOGEL M 6.5 STRL (GLOVE) ×2 IMPLANT
GOWN STRL REUS W/TWL LRG LVL3 (GOWN DISPOSABLE) ×4 IMPLANT
GUIDEWIRE ANG ZIPWIRE 038X150 (WIRE) ×4 IMPLANT
GUIDEWIRE STR DUAL SENSOR (WIRE) ×4 IMPLANT
IV NS 1000ML (IV SOLUTION)
IV NS 1000ML BAXH (IV SOLUTION) ×2 IMPLANT
IV NS IRRIG 3000ML ARTHROMATIC (IV SOLUTION) ×6 IMPLANT
KIT TURNOVER CYSTO (KITS) ×3 IMPLANT
MANIFOLD NEPTUNE II (INSTRUMENTS) ×3 IMPLANT
NS IRRIG 500ML POUR BTL (IV SOLUTION) ×3 IMPLANT
PACK CYSTO (CUSTOM PROCEDURE TRAY) ×3 IMPLANT
SHEATH URET ACCESS 12FR/35CM (UROLOGICAL SUPPLIES) ×1 IMPLANT
STENT POLARIS 5FRX24 (STENTS) ×2 IMPLANT
SYR 10ML LL (SYRINGE) ×3 IMPLANT
TRACTIP FLEXIVA PULS ID 200XHI (Laser) IMPLANT
TRACTIP FLEXIVA PULSE ID 200 (Laser) ×3
TUBE CONNECTING 12X1/4 (SUCTIONS) ×3 IMPLANT
TUBE FEEDING 8FR 16IN STR KANG (MISCELLANEOUS) ×1 IMPLANT
TUBING UROLOGY SET (TUBING) ×3 IMPLANT

## 2020-04-12 NOTE — Anesthesia Postprocedure Evaluation (Signed)
Anesthesia Post Note  Patient: Mark Leonard  Procedure(s) Performed: CYSTOSCOPY WITH RETROGRADE PYELOGRAM, URETEROSCOPY AND STENT PLACEMENT (Bilateral Renal) CYSTOSCOPY WITH  BLADDER AND KIDNEY LITHOLAPAXY (N/A Renal) HOLMIUM LASER APPLICATION (Bilateral Renal)     Patient location during evaluation: PACU Anesthesia Type: General Level of consciousness: awake and alert Pain management: pain level controlled Vital Signs Assessment: post-procedure vital signs reviewed and stable Respiratory status: spontaneous breathing, nonlabored ventilation and respiratory function stable Cardiovascular status: blood pressure returned to baseline and stable Postop Assessment: no apparent nausea or vomiting Anesthetic complications: no   No complications documented.  Last Vitals:  Vitals:   04/12/20 1015 04/12/20 1030  BP: (!) 159/76 (!) 153/78  Pulse: 100 100  Resp: 16 13  Temp:    SpO2: 92% 96%    Last Pain:  Vitals:   04/12/20 1030  TempSrc:   PainSc: 0-No pain                 Audry Pili

## 2020-04-12 NOTE — H&P (Signed)
Mark Leonard is an 71 y.o. male.    Chief Complaint:   HPI:   1 - Recurrent Urolithiasis - Long h/o stones s/p open Rt ureterolithotomy in 1970s x2, SWL x 4 (most recently 2003). Possible small volume Rt renal stone by L spine film 12/2016. His stone freqeuncy slowed drastically after being put on allopurinol interestingly. NO prior composition data for review.   03/2020 Rt flank pain with some radiation to groin, "feels similar to prior stones". 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 CT stone with large bladder stone and bilateral NON-obsturctig renal tones (Rt 1.2cm, Lt 1cm total volume).   PMH sig for DM2 (A1c <6!), Gout, obesity, lap chole. NO ischemic CV disesae / blood thinners. He enjoys walking his shor-haired [place]. His PCP is Mark Taylor DO. He is an Agricultural consultant at [employer].   Today "Mark Leonard" is seen to proceed with BILATERAL ureteroscopic stone manipulation and cystolithalopexy. NO interval fevers. Most recent UA without infectious parameters, C19 screen negative.     Past Medical History:  Diagnosis Date  . Acid reflux   . Allergy   . Borderline diabetes    checks cbg 3 x week  . COVID 04/2019   stuffy head sinus problems sob fatigue x 2 weeks  . Fatty liver   . Gout   . Heart murmur    mild no cardiologist for last 20 yrs   . History of kidney stones   . Hypertension   . Mitral regurgitation    mild   . Prostate hypertrophy   . Venous stasis    legs brown no vein doctor  . Wears glasses     Past Surgical History:  Procedure Laterality Date  . CARDIOVASCULAR STRESS TEST    . CHOLECYSTECTOMY  late 1990's  . COLONOSCOPY    . COLONOSCOPY N/A 05/17/2019   Procedure: COLONOSCOPY;  Surgeon: Daneil Dolin, MD;  Location: [facility];  Service: Endoscopy;  Laterality: N/A;  8:30  . EXTRACORPOREAL SHOCK WAVE LITHOTRIPSY  last done 18 yrs ago   4-5 times  . KIDNEY STONE SURGERY  1970's done x 2  . POLYPECTOMY  05/17/2019   Procedure: POLYPECTOMY;   Surgeon: Daneil Dolin, MD;  Location: [facility];  Service: Endoscopy;;  . VASECTOMY  late 1990's    Family History  Problem Relation Age of Onset  . Hypertension Mother   . Pulmonary disease Father   . Colon cancer Neg Hx    Social History:  reports that he quit smoking about 21 years ago. His smoking use included cigarettes. He has a 52.00 pack-year smoking history. He quit smokeless tobacco use about 21 years ago. He reports that he does not drink alcohol and does not use drugs.  Allergies:  Allergies  Allergen Reactions  . Tape Other (See Comments)    Adhesive Lehman Prom    Medications Prior to Admission  Medication Sig Dispense Refill  . allopurinol (ZYLOPRIM) 300 MG tablet Take 1 tablet (300 mg total) by mouth daily. 90 tablet 1  . blood glucose meter kit and supplies KIT Dispense based on patient and insurance preference. Use daily as directed. (FOR ICD-10 code E11.9) 1 each 5  . enalapril (VASOTEC) 20 MG tablet Take 1 tablet (20 mg total) by mouth at bedtime. (Patient taking differently: Take 10 mg by mouth daily.) 90 tablet 1  . esomeprazole (NEXIUM) 40 MG capsule TAKE 1 CAPSULE DAILY BEFOREBREAKFAST 90 capsule 1  . finasteride (PROSCAR) 5 MG  tablet Take 1 tablet (5 mg total) by mouth daily. 90 tablet 1  . glucose blood test strip Use to test blood sugar once daily. Use as instructed. One touch, verio strips. Dx:E11.9 100 each 12  . hydrochlorothiazide (HYDRODIURIL) 25 MG tablet Take 0.5 tablets (12.5 mg total) by mouth daily. 45 tablet 1  . ibuprofen (ADVIL) 200 MG tablet Take 200 mg by mouth every 6 (six) hours as needed. 2 200 mg tabs prn qhs    . metroNIDAZOLE (METROCREAM) 0.75 % cream APPLY CREAM TOPICALLY TWICE DAILY AS NEEDED (Patient taking differently: Apply 1 application topically as needed.) 45 g 4  . Multiple Vitamins-Minerals (MULTIVITAMIN MEN 50+ PO) Take 1 tablet by mouth daily.     . chlorzoxazone (PARAFON) 500 MG tablet Take 1 tablet (500 mg total) by mouth 3  (three) times daily as needed. 30 tablet 0    Results for orders placed or performed during the hospital encounter of 04/12/20 (from the past 48 hour(s))  I-STAT, chem 8     Status: Abnormal   Collection Time: 04/12/20  6:29 AM  Result Value Ref Range   Sodium 140 135 - 145 mmol/L   Potassium 4.0 3.5 - 5.1 mmol/L   Chloride 105 98 - 111 mmol/L   BUN 20 8 - 23 mg/dL   Creatinine, Ser 1.10 0.61 - 1.24 mg/dL   Glucose, Bld 119 (H) 70 - 99 mg/dL    Comment: Glucose reference range applies only to samples taken after fasting for at least 8 hours.   Calcium, Ion 1.12 (L) 1.15 - 1.40 mmol/L   TCO2 23 22 - 32 mmol/L   Hemoglobin 15.6 13.0 - 17.0 g/dL   HCT 46.0 39.0 - 52.0 %   No results found.  Review of Systems  Constitutional: Negative for chills and fever.  All other systems reviewed and are negative.   Blood pressure (!) 152/90, pulse (!) 106, temperature (!) 97.1 F (36.2 C), temperature source Oral, resp. rate 16, height _0  (1.702 m), weight 96.7 kg, SpO2 98 %. Physical Exam Vitals reviewed.  HENT:     Head: Normocephalic.     Nose: Nose normal.     Mouth/Throat:     Mouth: Mucous membranes are moist.  Eyes:     Pupils: Pupils are equal, round, and reactive to light.  Cardiovascular:     Rate and Rhythm: Normal rate.     Pulses: Normal pulses.  Pulmonary:     Effort: Pulmonary effort is normal.  Abdominal:     General: Abdomen is flat.  Genitourinary:    Comments: No CVAT at present.  Musculoskeletal:        General: Normal range of motion.     Cervical back: Normal range of motion.  Skin:    General: Skin is warm.  Neurological:     General: No focal deficit present.     Mental Status: He is alert.  Psychiatric:        Mood and Affect: Mood normal.      Assessment/Plan  Proceed as planned with cysto, BILATERAL ureteroscopic stone manipulation and cystolithalopexy for multifocal recurrnet urolithiasis. Risks, benefits, alternatives, expected peri-op  course discussed previously and reiterated today.   Alexis Frock, MD 04/12/2020, 7:03 AM

## 2020-04-12 NOTE — Discharge Instructions (Signed)
1 - You may have urinary urgency (bladder spasms) and bloody urine on / off with stent in place. This is normal.  2 - Remove tethered stents on Monday morning at home by pulling on strings, then blue-white plastic tubing, and discarding. Office is open Monday if any issues arise.   3 - Call MD or go to ER for fever >102, severe pain / nausea / vomiting not relieved by medications, or acute change in medical status  [facility] [phone] Post Ureteroscopy With or Without Stent Instructions  Definitions:  Ureter: The duct that transports urine from the kidney to the bladder. Stent:   A plastic hollow tube that is placed into the ureter, from the kidney to the bladder to prevent the ureter from swelling shut.  GENERAL INSTRUCTIONS:  Despite the fact that no skin incisions were used, the area around the ureter and bladder is raw and irritated. The stent is a foreign body which will further irritate the bladder wall. This irritation is manifested by increased frequency of urination, both day and night, and by an increase in the urge to urinate. In some, the urge to urinate is present almost always. Sometimes the urge is strong enough that you may not be able to stop yourself from urinating. The only real cure is to remove the stent and then give time for the bladder wall to heal which can't be done until the danger of the ureter swelling shut has passed, which varies.  You may see some blood in your urine while the stent is in place and a few days afterwards. Do not be alarmed, even if the urine was clear for a while. Get off your feet and drink lots of fluids until clearing occurs. If you start to pass clots or don't improve, call us.  DIET: You may return to your normal diet immediately. Because of the raw surface of your bladder, alcohol, spicy foods, acid type foods and drinks with caffeine may cause irritation or frequency and should be used in moderation. To keep your urine  flowing freely and to avoid constipation, drink plenty of fluids during the day ( 8-10 glasses ). Tip: Avoid cranberry juice because it is very acidic.  ACTIVITY: Your physical activity doesn't need to be restricted. However, if you are very active, you may see some blood in your urine. We suggest that you reduce your activity under these circumstances until the bleeding has stopped.  BOWELS: It is important to keep your bowels regular during the postoperative period. Straining with bowel movements can cause bleeding. A bowel movement every other day is reasonable. Use a mild laxative if needed, such as Milk of Magnesia 2-3 tablespoons, or 2 Dulcolax tablets. Call if you continue to have problems. If you have been taking narcotics for pain, before, during or after your surgery, you may be constipated. Take a laxative if necessary.   MEDICATION: You should resume your pre-surgery medications unless told not to. In addition you will often be given an antibiotic to prevent infection. These should be taken as prescribed until the bottles are finished unless you are having an unusual reaction to one of the drugs.  PROBLEMS YOU SHOULD REPORT TO [place]:  Fevers over 100.5 Fahrenheit.  Heavy bleeding, or clots ( See above notes about blood in urine ).  Inability to urinate.  Drug reactions ( hives, rash, nausea, vomiting, diarrhea ).  Severe burning or pain with urination that is not improving.  FOLLOW-UP: You will need a  follow-up appointment to monitor your progress. Call for this appointment at the number listed above. Usually the first appointment will be about three to fourteen days after your surgery.      Post Anesthesia Home Care Instructions  Activity: Get plenty of rest for the remainder of the day. A responsible adult should stay with you for 24 hours following the procedure.  For the next 24 hours, DO NOT: -Drive a car -Paediatric nurse -Drink alcoholic beverages -Take any  medication unless instructed by your physician -Make any legal decisions or sign important papers.  Meals: Start with liquid foods such as gelatin or soup. Progress to regular foods as tolerated. Avoid greasy, spicy, heavy foods. If nausea and/or vomiting occur, drink only clear liquids until the nausea and/or vomiting subsides. Call your physician if vomiting continues.  Special Instructions/Symptoms: Your throat may feel dry or sore from the anesthesia or the breathing tube placed in your throat during surgery. If this causes discomfort, gargle with warm salt water. The discomfort should disappear within 24 hours.  If you had a scopolamine patch placed behind your ear for the management of post- operative nausea and/or vomiting:  1. The medication in the patch is effective for 72 hours, after which it should be removed.  Wrap patch in a tissue and discard in the trash. Wash hands thoroughly with soap and water. 2. You may remove the patch earlier than 72 hours if you experience unpleasant side effects which may include dry mouth, dizziness or visual disturbances. 3. Avoid touching the patch. Wash your hands with soap and water after contact with the patch.

## 2020-04-12 NOTE — Anesthesia Procedure Notes (Signed)
Procedure Name: LMA Insertion Performed by: Hewitt Blade, CRNA Pre-anesthesia Checklist: Patient identified, Emergency Drugs available, Suction available and Patient being monitored Patient Re-evaluated:Patient Re-evaluated prior to induction Oxygen Delivery Method: Circle system utilized Preoxygenation: Pre-oxygenation with 100% oxygen Induction Type: IV induction Ventilation: Mask ventilation without difficulty LMA: LMA inserted LMA Size: 4.0 Number of attempts: 1 Placement Confirmation: positive ETCO2 and breath sounds checked- equal and bilateral Tube secured with: Tape Dental Injury: Teeth and Oropharynx as per pre-operative assessment

## 2020-04-12 NOTE — Brief Op Note (Signed)
04/12/2020  9:44 AM  PATIENT:  Mark Leonard  70 y.o. male  PRE-OPERATIVE DIAGNOSIS:  LARGE BLADDER STONE, BILATERAL RENAL STONES  POST-OPERATIVE DIAGNOSIS:  LARGE BLADDER STONE, BILATERAL RENAL STONES  PROCEDURE:  Procedure(s): CYSTOSCOPY WITH RETROGRADE PYELOGRAM, URETEROSCOPY AND STENT PLACEMENT (Bilateral) CYSTOSCOPY WITH  BLADDER AND KIDNEY LITHOLAPAXY (N/A) HOLMIUM LASER APPLICATION (Bilateral)  SURGEON:  Surgeon(s) and Role:    Alexis Frock, MD - Primary  PHYSICIAN ASSISTANT:   ASSISTANTS: none   ANESTHESIA:   general  EBL:  minmal   BLOOD ADMINISTERED:none  DRAINS: none   LOCAL MEDICATIONS USED:  NONE  SPECIMEN:  Source of Specimen:  1- bladder stone fragments - give to pt; 2 - Renal stone fragments - [facility] for compostitional analysis  DISPOSITION OF SPECIMEN:  see above  COUNTS:  YES  TOURNIQUET:  * No tourniquets in log *  DICTATION: .Other Dictation: Dictation Number  [id number]  PLAN OF CARE: Discharge to home after PACU  PATIENT DISPOSITION:  PACU - hemodynamically stable.   Delay start of Pharmacological VTE agent (>24hrs) due to surgical blood loss or risk of bleeding: yes

## 2020-04-12 NOTE — Op Note (Signed)
NAME: Mark Leonard, Mark Leonard MEDICAL RECORD PX:[id number] ACCOUNT [id number] DATE OF BIRTH:1950/07/25 FACILITY: [facility] LOCATION: [facility] PHYSICIAN:Kelvis Berger, MD  OPERATIVE REPORT  DATE OF PROCEDURE:  04/12/2020  PREOPERATIVE DIAGNOSIS:  Bilateral renal and large bladder stone.  PROCEDURES: 1.  Cystoscopy with bilateral ureteroscopy, retrograde, laser lithotripsy. 2.  Cystolitholapaxy stone less than 2.5 cm.  ESTIMATED BLOOD LOSS:  Nil.  COMPLICATIONS:  None.  SPECIMENS: 1.  Bladder stone fragments given to the patient. 2.  Bilateral renal stone fragments for composition stone analysis.  FINDINGS: 1.  Large jack type bladder stone approximately 2 cm with moderate trilobar prostatic hypertrophy, mild bladder trabeculation. 2.  Bilateral right greater than left renal stones.  No hydronephrosis or obstruction. 3.  Complete resolution of all accessible stone fragments larger than one-third mm following laser lithotripsy and extraction. 4.  Successful placement of bilateral ureteral stents, proximal end in renal pelvis, distal end in urinary bladder.  INDICATIONS:  The patient is a pleasant 70 year old man with longstanding history of recurrent urolithiasis spanning decades.  He was found on workup for colicky flank pain and hematuria to have a recurrence of bilateral renal stones.  No obvious  hydronephrosis or obstruction, and also a large bladder stone around 1.5 cm.  Options were discussed for management including surveillance versus ureteroscopy versus shockwave lithotripsy.  He wished to proceed with ureteroscopy with cystolitholapaxy  with goal of stone free.  Informed consent was obtained and placed in medical record.  DESCRIPTION OF PROCEDURE:  The patient being Mark Leonard verified, the procedure being bilateral ureteroscopic stimulation plus cystolitholapaxy was confirmed.  Procedure timeout was performed.  Intravenous antibiotics were administered.    General LMA   anesthesia was induced.  The patient was placed into a low lithotomy position.  Sterile field was created, prepped and draped the patient's penis, perineum, and proximal thighs using iodine.  Urethra was calibrated to 28-French, and cystourethroscopy  was performed using a 21-French rigid cystoscope with offset lens.  Inspection of anterior and posterior urethra revealed only some moderate trilobar prostatic hypertrophy, not severe, mild bladder trabeculation.  Inspection of urinary bladder revealed  bilateral ureteral orifices in normal anatomic position and the significant jack type stone in the lower right bladder.  Using a stent pusher for stabilization and a 200 nanometer laser fiber, the stone was dusted using settings of 0.5 joules and 50 Hz.   Dusting technique was chosen given his large prostate to prevent manipulation with large sheath and bleeding.  This resulted in excellent dusting of the bladder stone, fragments were irrigated and set aside to be given to the patient.  Attention was  directed to ureteroscopy.  The right ureteral orifice was cannulated with a 6-French renal catheter, and right retrograde pyelogram was obtained.  Right retrograde pyelogram demonstrated a single right ureter, single system right kidney.  No filling defects or narrowing noted.  The 0.03 ZIPwire was advanced to lower pole and set aside as a safety wire.  Next, left retrograde pyelogram was obtained.  Left retrograde pyelogram notes a single left ureter, single system left kidney.  No filling defects or narrowing noted.  A separate ZIPwire was advanced to lower pole and set aside as a safety wire.  An 8-French feeding tube was placed in the urinary  bladder for pressure release, and semirigid ureteroscopy was performed of the distal 4/5 of the left ureter alongside a separate sensor working wire.  No mucosal abnormalities were found.  Next, semirigid ureteroscopy was performed of the distal 4/5  of  the right ureter  alongside a separate sensor working wire.  No mucosal abnormalities were found.  Next, the semirigid scope was exchanged for a 12/14 medium length ureteral access sheath to the level of the proximal ureter using continuous fluoroscopic  guidance, and flexible digital ureteroscopy was performed of the proximal ureter and systematic inspection of right kidney including all calices x3.  There are multifocal very large volume papillary tip calcifications noted.  There were 2 foci of stones  in the upper pole.  They were approximately 6-7 mm each.  These were much too large for simple basketing.  The smaller stones were amenable to basketing.  They were grasped and Escape basket, removed and set aside for composition analysis.  The larger  stones was addressed using holmium laser energy at settings of 0.3 joules and 30 Hz, and using dusting technique, approximately 50% of stone was dusted, 30% fragmented with the remaining fragments amenable to basketing.  Following this, complete  resolution of all accessible stone fragments larger than one-third mm with excellent hemostasis.  The access sheath was removed under continuous vision.  No mucosal abnormalities were found on the right side.  Next, the access sheath was placed over the  left sensor working wire to the level of the proximal left ureter using continuous fluoroscopic guidance, and flexible digital ureteroscopy was performed of the proximal left ureter and systematic inspection of left kidney including all calices x3.   Similarly, there were multifocal papillary tip calcifications in the left side.  Overall, stone volume was less.  Vast majority of these were amenable to simple basketing.  One focus of stone did require some holmium laser energy again using settings of  0.3 joules and 30 Hz, 50% dusted, 30% fragmented with the remaining fragments amenable to basketing.  Following this, complete resolution of all accessible stone fragments larger than  one-third mm on the left side.  No evidence of any sort of renal  perforation.  The access sheath was removed under continuous vision.  No significant mucosal findings were found.  Given the bilateral nature of the procedure, it was felt that brief interval stenting with tethered stents be warranted.  As such, new 5 x  24 Polaris-type stents were placed over the remaining safety wire using fluoroscopic guidance bilaterally.  Good proximal and distal planes were noted.  Tether was left in place and fashioned to each other and then to the dorsum of the penis and the  procedure was terminated.  The patient tolerated the procedure well.  No immediate complications.  The patient was taken to postanesthesia care in stable condition.  Plan for discharge home.  He is uncircumcised.  His foreskin was reduced at the conclusion of the case.  IN/NUANCE  D:04/12/2020 T:04/12/2020 JOB:[id number]

## 2020-04-12 NOTE — Transfer of Care (Signed)
Immediate Anesthesia Transfer of Care Note  Patient: Mark Leonard  Procedure(s) Performed: CYSTOSCOPY WITH RETROGRADE PYELOGRAM, URETEROSCOPY AND STENT PLACEMENT (Bilateral Renal) CYSTOSCOPY WITH  BLADDER AND KIDNEY LITHOLAPAXY (N/A Renal) HOLMIUM LASER APPLICATION (Bilateral Renal)  Patient Location: PACU  Anesthesia Type:General  Level of Consciousness: awake  Airway & Oxygen Therapy: Patient Spontanous Breathing and Patient connected to nasal cannula oxygen  Post-op Assessment: Report given to RN and Post -op Vital signs reviewed and stable  Post vital signs: Reviewed and stable  Last Vitals:  Vitals Value Taken Time  BP 150/86 04/12/20 1001  Temp    Pulse 101 04/12/20 1003  Resp 14 04/12/20 1003  SpO2 98 % 04/12/20 1003  Vitals shown include unvalidated device data.  Last Pain:  Vitals:   04/12/20 0618  TempSrc: Oral  PainSc: 0-No pain      Patients Stated Pain Goal: 5 (50/38/88 2800)  Complications: No complications documented.

## 2020-04-13 ENCOUNTER — Emergency Department (HOSPITAL_COMMUNITY)
Admission: EM | Admit: 2020-04-13 | Discharge: 2020-04-13 | Disposition: A | Discharge status: Home / Self Care | Payer: PPO | Attending: Emergency Medicine | Admitting: Emergency Medicine

## 2020-04-13 ENCOUNTER — Other Ambulatory Visit: Payer: Self-pay

## 2020-04-13 ENCOUNTER — Encounter (HOSPITAL_COMMUNITY): Payer: Self-pay

## 2020-04-13 DIAGNOSIS — E1159 Type 2 diabetes mellitus with other circulatory complications: Secondary | ICD-10-CM | POA: Diagnosis not present

## 2020-04-13 DIAGNOSIS — Z87891 Personal history of nicotine dependence: Secondary | ICD-10-CM | POA: Insufficient documentation

## 2020-04-13 DIAGNOSIS — Z79899 Other long term (current) drug therapy: Secondary | ICD-10-CM | POA: Diagnosis not present

## 2020-04-13 DIAGNOSIS — I10 Essential (primary) hypertension: Secondary | ICD-10-CM | POA: Insufficient documentation

## 2020-04-13 DIAGNOSIS — R319 Hematuria, unspecified: Secondary | ICD-10-CM | POA: Diagnosis not present

## 2020-04-13 DIAGNOSIS — Z8616 Personal history of COVID-19: Secondary | ICD-10-CM | POA: Diagnosis not present

## 2020-04-13 DIAGNOSIS — R339 Retention of urine, unspecified: Secondary | ICD-10-CM | POA: Diagnosis not present

## 2020-04-13 MED ORDER — LIDOCAINE HCL URETHRAL/MUCOSAL 2 % EX GEL
1.0000 "application " | Freq: Once | CUTANEOUS | Status: AC
  Administered 2020-04-13: 1 via URETHRAL
  Filled 2020-04-13: qty 11

## 2020-04-13 MED ORDER — LIDOCAINE HCL 1 % IJ SOLN
INTRAMUSCULAR | Status: AC
  Filled 2020-04-13: qty 20

## 2020-04-13 NOTE — ED Notes (Signed)
Bladder scan volume: 291 mL. 

## 2020-04-13 NOTE — ED Provider Notes (Signed)
[facility] DEPT Provider Note   CSN: [id number] Arrival date & time: 04/13/20  0259     History Chief Complaint  Patient presents with  . Urinary Retention    Mark Leonard is a 70 y.o. male.  Patient presents to the emergency department for evaluation of urinary retention.  Patient reports that he had ureteral stents placed this morning.  He reports that he has not been able to pass urine since around 4 PM.  He has had progressively worsening discomfort in the area of his bladder.  He has had some nausea associated with symptoms.  No fever.        Past Medical History:  Diagnosis Date  . Acid reflux   . Allergy   . Borderline diabetes    checks cbg 3 x week  . COVID 04/2019   stuffy head sinus problems sob fatigue x 2 weeks  . Fatty liver   . Gout   . Heart murmur    mild no cardiologist for last 20 yrs   . History of kidney stones   . Hypertension   . Mitral regurgitation    mild   . Prostate hypertrophy   . Venous stasis    legs brown no vein doctor  . Wears glasses     Patient Active Problem List   Diagnosis Date Noted  . Diabetes ([place]) 05/27/2013  . Esophageal reflux 09/21/2012  . Gout 09/21/2012  . Fatty liver 09/21/2012  . Prostate hypertrophy 09/21/2012  . Venous stasis 09/21/2012  . Essential hypertension, benign 06/22/2012    Past Surgical History:  Procedure Laterality Date  . CARDIOVASCULAR STRESS TEST    . CHOLECYSTECTOMY  late 1990's  . COLONOSCOPY    . COLONOSCOPY N/A 05/17/2019   Procedure: COLONOSCOPY;  Surgeon: Daneil Dolin, MD;  Location: [facility];  Service: Endoscopy;  Laterality: N/A;  8:30  . EXTRACORPOREAL SHOCK WAVE LITHOTRIPSY  last done 18 yrs ago   4-5 times  . KIDNEY STONE SURGERY  1970's done x 2  . POLYPECTOMY  05/17/2019   Procedure: POLYPECTOMY;  Surgeon: Daneil Dolin, MD;  Location: [facility];  Service: Endoscopy;;  . VASECTOMY  late 1990's       Family History   Problem Relation Age of Onset  . Hypertension Mother   . Pulmonary disease Father   . Colon cancer Neg Hx     Social History   Tobacco Use  . Smoking status: Former Smoker    Packs/day: 2.00    Years: 26.00    Pack years: 52.00    Types: Cigarettes    Quit date: 04/04/1999    Years since quitting: 21.0  . Smokeless tobacco: Former Systems developer    Quit date: 05/26/1998  Vaping Use  . Vaping Use: Never used  Substance Use Topics  . Alcohol use: No    Alcohol/week: 0.0 standard drinks  . Drug use: No    Home Medications Prior to Admission medications   Medication Sig Start Date End Date Taking? Authorizing Provider  allopurinol (ZYLOPRIM) 300 MG tablet Take 1 tablet (300 mg total) by mouth daily. 01/29/20   Elvia Collum M, DO  blood glucose meter kit and supplies KIT Dispense based on patient and insurance preference. Use daily as directed. (FOR ICD-10 code E11.9) 07/06/17   Mikey Kirschner, MD  cephALEXin (KEFLEX) 500 MG capsule Take 1 capsule (500 mg total) by mouth 2 (two) times daily. X 3 days to prevent  infection with tethered stents in place 04/12/20   Alexis Frock, MD  chlorzoxazone (PARAFON) 500 MG tablet Take 1 tablet (500 mg total) by mouth 3 (three) times daily as needed. 01/29/20   Lovena Le, Malena M, DO  enalapril (VASOTEC) 20 MG tablet Take 1 tablet (20 mg total) by mouth at bedtime. Patient taking differently: Take 10 mg by mouth daily. 02/13/20 03/14/20  Elvia Collum M, DO  esomeprazole (NEXIUM) 40 MG capsule TAKE 1 CAPSULE DAILY BEFOREBREAKFAST 01/29/20   Lovena Le, Malena M, DO  finasteride (PROSCAR) 5 MG tablet Take 1 tablet (5 mg total) by mouth daily. 01/29/20   Lovena Le, Malena M, DO  glucose blood test strip Use to test blood sugar once daily. Use as instructed. One touch, verio strips. Dx:E11.9 01/29/20   Elvia Collum M, DO  hydrochlorothiazide (HYDRODIURIL) 25 MG tablet Take 0.5 tablets (12.5 mg total) by mouth daily. 02/09/20   Elvia Collum M, DO  ketorolac  (TORADOL) 10 MG tablet Take 1 tablet (10 mg total) by mouth every 8 (eight) hours as needed for moderate pain. Or stent discomfort post-operatively. 04/12/20   Alexis Frock, MD  metroNIDAZOLE (METROCREAM) 0.75 % cream APPLY CREAM TOPICALLY TWICE DAILY AS NEEDED Patient taking differently: Apply 1 application topically as needed. 05/11/17   Mikey Kirschner, MD  Multiple Vitamins-Minerals (MULTIVITAMIN MEN 50+ PO) Take 1 tablet by mouth daily.     [provider]  oxyCODONE-acetaminophen (PERCOCET) 5-325 MG tablet Take 1 tablet by mouth every 8 (eight) hours as needed for severe pain. Post-operatively 04/12/20 04/12/21  Alexis Frock, MD    Allergies    Tape  Review of Systems   Review of Systems  Genitourinary: Positive for decreased urine volume.  All other systems reviewed and are negative.   Physical Exam Updated Vital Signs BP 135/88   Pulse 99   Temp 97.9 F (36.6 C) (Oral)   Resp 16   Ht 5' 7" (1.702 m)   Wt 96.6 kg   SpO2 95%   BMI 33.36 kg/m   Physical Exam Vitals and nursing note reviewed.  Constitutional:      General: He is in acute distress.     Appearance: Normal appearance. He is well-developed and well-nourished.  HENT:     Head: Normocephalic and atraumatic.     Right Ear: Hearing normal.     Left Ear: Hearing normal.     Nose: Nose normal.     Mouth/Throat:     Mouth: Oropharynx is clear and moist and mucous membranes are normal.  Eyes:     Extraocular Movements: EOM normal.     Conjunctiva/sclera: Conjunctivae normal.     Pupils: Pupils are equal, round, and reactive to light.  Cardiovascular:     Rate and Rhythm: Regular rhythm.     Heart sounds: S1 normal and S2 normal. No murmur heard. No friction rub. No gallop.   Pulmonary:     Effort: Pulmonary effort is normal. No respiratory distress.     Breath sounds: Normal breath sounds.  Chest:     Chest wall: No tenderness.  Abdominal:     General: Bowel sounds are normal.     Palpations:  Abdomen is soft. There is no hepatosplenomegaly.     Tenderness: There is abdominal tenderness in the suprapubic area. There is no guarding or rebound. Negative signs include Murphy's sign and McBurney's sign.     Hernia: No hernia is present.  Musculoskeletal:        General: Normal  range of motion.     Cervical back: Normal range of motion and neck supple.  Skin:    General: Skin is warm, dry and intact.     Findings: No rash.     Nails: There is no cyanosis.  Neurological:     Mental Status: He is alert and oriented to person, place, and time.     GCS: GCS eye subscore is 4. GCS verbal subscore is 5. GCS motor subscore is 6.     Cranial Nerves: No cranial nerve deficit.     Sensory: No sensory deficit.     Coordination: Coordination normal.     Deep Tendon Reflexes: Strength normal.  Psychiatric:        Mood and Affect: Mood and affect normal.        Speech: Speech normal.        Behavior: Behavior normal.        Thought Content: Thought content normal.     ED Results / Procedures / Treatments   Labs (all labs ordered are listed, but only abnormal results are displayed) Labs Reviewed - No data to display  EKG None  Radiology No results found.  Procedures Procedures (including critical care time)  Medications Ordered in ED Medications  lidocaine (XYLOCAINE) 2 % jelly 1 application (1 application Urethral Given 04/13/20 0420)    ED Course  I have reviewed the triage vital signs and the nursing notes.  Pertinent labs & imaging results that were available during my care of the patient were reviewed by me and considered in my medical decision making (see chart for details).    MDM Rules/Calculators/A&P                          Patient presents to the emergency department with acute urinary retention secondary to clots from urologic procedure.  A Foley catheter was placed and he did have large amount of bloody urine output.  He is draining fairly currently.  Urine is  clearing, still pink but no clots.  Discharged with catheter.  Final Clinical Impression(s) / ED Diagnoses Final diagnoses:  Hematuria, unspecified type  Urinary retention    Rx / DC Orders ED Discharge Orders    None       Pollina, Gwenyth Allegra, MD 04/13/20 [phone]

## 2020-04-13 NOTE — ED Triage Notes (Addendum)
Pt arrives with c/o urinary retention and unable to voids since 1600 yesterday. Pt reports having stents placed yesterday. Called [facility] and they stated possibility of blood clots blocking.

## 2020-04-15 ENCOUNTER — Encounter (HOSPITAL_BASED_OUTPATIENT_CLINIC_OR_DEPARTMENT_OTHER): Payer: Self-pay | Admitting: Urology

## 2020-04-15 DIAGNOSIS — N200 Calculus of kidney: Secondary | ICD-10-CM | POA: Diagnosis not present

## 2020-04-25 DIAGNOSIS — N200 Calculus of kidney: Secondary | ICD-10-CM | POA: Diagnosis not present

## 2020-04-25 DIAGNOSIS — R338 Other retention of urine: Secondary | ICD-10-CM | POA: Diagnosis not present

## 2020-05-28 DIAGNOSIS — N200 Calculus of kidney: Secondary | ICD-10-CM | POA: Diagnosis not present

## 2020-05-29 DIAGNOSIS — N200 Calculus of kidney: Secondary | ICD-10-CM | POA: Diagnosis not present

## 2020-06-10 ENCOUNTER — Telehealth: Payer: Self-pay | Admitting: Family Medicine

## 2020-06-10 DIAGNOSIS — R338 Other retention of urine: Secondary | ICD-10-CM | POA: Diagnosis not present

## 2020-06-10 DIAGNOSIS — N200 Calculus of kidney: Secondary | ICD-10-CM | POA: Diagnosis not present

## 2020-06-10 DIAGNOSIS — R34 Anuria and oliguria: Secondary | ICD-10-CM | POA: Diagnosis not present

## 2020-06-10 DIAGNOSIS — R82998 Other abnormal findings in urine: Secondary | ICD-10-CM | POA: Diagnosis not present

## 2020-06-10 NOTE — Telephone Encounter (Signed)
[facility] requesting refills on Finasteride 5 mg tablets, Esomeprazole 40 mg cap, Allopurinol 300 mg tablets. Pt last seen October 2021. Please advise. Thank you.

## 2020-06-11 MED ORDER — FINASTERIDE 5 MG PO TABS: 5.0000 mg | ORAL_TABLET | Freq: Every day | ORAL | 0 refills | Status: DC

## 2020-06-11 MED ORDER — ALLOPURINOL 300 MG PO TABS: 300.0000 mg | ORAL_TABLET | Freq: Every day | ORAL | 0 refills | Status: DC

## 2020-06-11 MED ORDER — ESOMEPRAZOLE MAGNESIUM 40 MG PO CPDR: DELAYED_RELEASE_CAPSULE | ORAL | 0 refills | Status: DC

## 2020-06-11 NOTE — Telephone Encounter (Signed)
Pls give 30 day supply till appt, no refills on these meds. Dr. Lovena Le

## 2020-06-11 NOTE — Telephone Encounter (Signed)
30 day refill sent in to pharmacy

## 2020-06-11 NOTE — Addendum Note (Signed)
Addended by: Vicente Males on: 06/11/2020 11:26 AM   Modules accepted: Orders

## 2020-06-12 ENCOUNTER — Telehealth: Payer: Self-pay | Admitting: Family Medicine

## 2020-06-12 NOTE — Telephone Encounter (Signed)
Patient wants to know why his medication where change to 30 days instead of 90 days its cost him more at thirty days its cheaper at ninety through mail order and he would like to go back to ninety days please for his esomeprazole 40 mg,finasteride 5 mg and allopurinol 300 mg. [facility]  Please advise

## 2020-06-13 NOTE — Telephone Encounter (Signed)
Sorry new policy if needing refills and not been seen within the 13month window or needing refills we don't give 90 day supply if the pt hasn't had labs or appt yet.  Give 30 day script till the appt. Sorry for the inconvenience.  Try to make sure your appts are within the 6 month  so you won't have interruption in the 90 day supplies.  Thanks,   Dr. Lovena Le

## 2020-06-13 NOTE — Telephone Encounter (Signed)
Discussed with pt. Pt verbalized understanding.  °

## 2020-06-13 NOTE — Telephone Encounter (Signed)
Last seen 01/29/20 for check up and has upcoming appt 4/25

## 2020-07-12 DIAGNOSIS — Z029 Encounter for administrative examinations, unspecified: Secondary | ICD-10-CM

## 2020-07-23 ENCOUNTER — Encounter: Payer: Self-pay | Admitting: Emergency Medicine

## 2020-07-23 ENCOUNTER — Ambulatory Visit
Admission: EM | Admit: 2020-07-23 | Discharge: 2020-07-23 | Disposition: A | Discharge status: Home / Self Care | Payer: PPO | Attending: Family Medicine | Admitting: Family Medicine

## 2020-07-23 ENCOUNTER — Other Ambulatory Visit: Payer: Self-pay

## 2020-07-23 DIAGNOSIS — H9202 Otalgia, left ear: Secondary | ICD-10-CM | POA: Diagnosis not present

## 2020-07-23 DIAGNOSIS — H6502 Acute serous otitis media, left ear: Secondary | ICD-10-CM

## 2020-07-23 DIAGNOSIS — H6122 Impacted cerumen, left ear: Secondary | ICD-10-CM

## 2020-07-23 MED ORDER — AMOXICILLIN-POT CLAVULANATE 875-125 MG PO TABS: 1.0000 | ORAL_TABLET | Freq: Two times a day (BID) | ORAL | 0 refills | Status: AC

## 2020-07-23 NOTE — ED Provider Notes (Signed)
Mark [place]   [id number] 07/23/20 Arrival Time: 2947  CC: EAR PAIN  SUBJECTIVE: History from: patient.  Mark Leonard is a 70 y.o. male who presents with of left otalgia. Denies a precipitating event, such as swimming or wearing ear plugs. Patient states the pain is constant and achy in character. Patient has not taken OTC medications for this. Symptoms are made worse with lying down. Reports similar symptoms in the past with prior ear infections. Denies fever, chills, fatigue, sinus pain, rhinorrhea, ear discharge, sore throat, SOB, wheezing, chest pain, nausea, changes in bowel or bladder habits.    ROS: As per HPI.  All other pertinent ROS negative.     Past Medical History:  Diagnosis Date  . Acid reflux   . Allergy   . Borderline diabetes    checks cbg 3 x week  . COVID 04/2019   stuffy head sinus problems sob fatigue x 2 weeks  . Fatty liver   . Gout   . Heart murmur    mild no cardiologist for last 20 yrs   . History of kidney stones   . Hypertension   . Mitral regurgitation    mild   . Prostate hypertrophy   . Venous stasis    legs brown no vein doctor  . Wears glasses    Past Surgical History:  Procedure Laterality Date  . CARDIOVASCULAR STRESS TEST    . CHOLECYSTECTOMY  late 1990's  . COLONOSCOPY    . COLONOSCOPY N/A 05/17/2019   Procedure: COLONOSCOPY;  Surgeon: Daneil Dolin, MD;  [facility];  Service: Endoscopy;  Laterality: N/A;  8:30  . CYSTOSCOPY WITH LITHOLAPAXY N/A 04/12/2020   Procedure: CYSTOSCOPY WITH  BLADDER AND KIDNEY LITHOLAPAXY;  Surgeon: Alexis Frock, MD;  Location: [facility];  Service: Urology;  Laterality: N/A;  . CYSTOSCOPY WITH RETROGRADE PYELOGRAM, URETEROSCOPY AND STENT PLACEMENT Bilateral 04/12/2020   Procedure: CYSTOSCOPY WITH RETROGRADE PYELOGRAM, URETEROSCOPY AND STENT PLACEMENT;  Surgeon: Alexis Frock, MD;  Location: [facility];  Service: Urology;  Laterality: Bilateral;   . EXTRACORPOREAL SHOCK WAVE LITHOTRIPSY  last done 18 yrs ago   4-5 times  . HOLMIUM LASER APPLICATION Bilateral 09/08/4648   Procedure: HOLMIUM LASER APPLICATION;  Surgeon: Alexis Frock, MD;  Location: [facility];  Service: Urology;  Laterality: Bilateral;  . KIDNEY STONE SURGERY  1970's done x 2  . POLYPECTOMY  05/17/2019   Procedure: POLYPECTOMY;  Surgeon: Daneil Dolin, MD;  [facility]: [facility];  Service: Endoscopy;;  . VASECTOMY  late 1990's   Allergies  Allergen Reactions  . Tape Other (See Comments)    Adhesive Lehman Prom   No current facility-administered medications on file prior to encounter.   Current Outpatient Medications on File Prior to Encounter  Medication Sig Dispense Refill  . allopurinol (ZYLOPRIM) 300 MG tablet Take 1 tablet (300 mg total) by mouth daily. 30 tablet 0  . blood glucose meter kit and supplies KIT Dispense based on patient and insurance preference. Use daily as directed. (FOR ICD-10 code E11.9) 1 each 5  . chlorzoxazone (PARAFON) 500 MG tablet Take 1 tablet (500 mg total) by mouth 3 (three) times daily as needed. 30 tablet 0  . enalapril (VASOTEC) 20 MG tablet Take 1 tablet (20 mg total) by mouth at bedtime. (Patient taking differently: Take 10 mg by mouth daily.) 90 tablet 1  . esomeprazole (NEXIUM) 40 MG capsule TAKE 1 CAPSULE DAILY BEFOREBREAKFAST 30 capsule 0  .  finasteride (PROSCAR) 5 MG tablet Take 1 tablet (5 mg total) by mouth daily. 30 tablet 0  . glucose blood test strip Use to test blood sugar once daily. Use as instructed. One touch, verio strips. Dx:E11.9 100 each 12  . hydrochlorothiazide (HYDRODIURIL) 25 MG tablet Take 0.5 tablets (12.5 mg total) by mouth daily. 45 tablet 1  . ketorolac (TORADOL) 10 MG tablet Take 1 tablet (10 mg total) by mouth every 8 (eight) hours as needed for moderate pain. Or stent discomfort post-operatively. 20 tablet 0  . metroNIDAZOLE (METROCREAM) 0.75 % cream APPLY CREAM TOPICALLY TWICE DAILY AS  NEEDED (Patient taking differently: Apply 1 application topically as needed.) 45 g 4  . Multiple Vitamins-Minerals (MULTIVITAMIN MEN 50+ PO) Take 1 tablet by mouth daily.     Marland Kitchen oxyCODONE-acetaminophen (PERCOCET) 5-325 MG tablet Take 1 tablet by mouth every 8 (eight) hours as needed for severe pain. Post-operatively 15 tablet 0   Social History   Socioeconomic History  . Marital status: Married    Spouse name: Not on file  . Number of children: Not on file  . Years of education: Not on file  . Highest education level: Not on file  Occupational History  . Not on file  Tobacco Use  . Smoking status: Former Smoker    Packs/day: 2.00    Years: 26.00    Pack years: 52.00    Types: Cigarettes    Quit date: 04/04/1999    Years since quitting: 21.3  . Smokeless tobacco: Former Systems developer    Quit date: 05/26/1998  Vaping Use  . Vaping Use: Never used  Substance and Sexual Activity  . Alcohol use: No    Alcohol/week: 0.0 standard drinks  . Drug use: No  . Sexual activity: Not on file  Other Topics Concern  . Not on file  Social History Narrative  . Not on file   Social Determinants of Health   Financial Resource Strain: Not on file  Food Insecurity: Not on file  Transportation Needs: Not on file  Physical Activity: Not on file  Stress: Not on file  Social Connections: Not on file  Intimate Partner Violence: Not on file   Family History  Problem Relation Age of Onset  . Hypertension Mother   . Pulmonary disease Father   . Colon cancer Neg Hx     OBJECTIVE:  Vitals:   07/23/20 1230  BP: 121/83  Pulse: 97  Resp: 19  Temp: 98 F (36.7 C)  TempSrc: Oral  SpO2: 95%     General appearance: alert; appears fatigued HEENT: Ears: R EAC clear, L EAC with cerumen impaction, R TM pearly gray with visible cone of light, without erythema, L TM erythematous, bulging, with effusion; Eyes: PERRL, EOMI grossly; Sinuses nontender to palpation; Nose: clear rhinorrhea; Throat: oropharynx  mildly erythematous, tonsils 1+ without white tonsillar exudates, uvula midline Neck: supple without LAD Lungs: unlabored respirations, symmetrical air entry; cough: absent; no respiratory distress Heart: regular rate and rhythm.  Radial pulses 2+ symmetrical bilaterally Skin: warm and dry Psychological: alert and cooperative; normal mood and affect  Imaging: No results found.   ASSESSMENT & PLAN:  1. Non-recurrent acute serous otitis media of left ear   2. Acute otalgia, left   3. Impacted cerumen of left ear     Meds ordered this encounter  Medications  . amoxicillin-clavulanate (AUGMENTIN) 875-125 MG tablet    Sig: Take 1 tablet by mouth 2 (two) times daily for 7 days.  Dispense:  14 tablet    Refill:  0    Order Specific Question:   Supervising Provider    Answer:   Chase Picket [id number]   Cerumen impaction to L ear cleared with irrigation in office Rest and drink plenty of fluids Prescribed augmentin 875 BID for 7 days Take medications as directed and to completion Continue to use OTC ibuprofen and/ or tylenol as needed for pain control Follow up with PCP if symptoms persists Return here or go to the ER if you have any new or worsening symptoms   Reviewed expectations re: course of current medical issues. Questions answered. Outlined signs and symptoms indicating need for more acute intervention. Patient verbalized understanding. After Visit Summary given.         Faustino Congress, NP 07/24/20 [phone]

## 2020-07-23 NOTE — ED Triage Notes (Signed)
Pain to LT ear that started yesterday, pain is now on LT side of face this morning.

## 2020-07-23 NOTE — Discharge Instructions (Signed)
I have sent in Augmentin for you to take twice a day for 7 days.  Follow up with this office or with primary care if symptoms are persisting.  Follow up in the ER for high fever, trouble swallowing, trouble breathing, other concerning symptoms.

## 2020-07-29 ENCOUNTER — Other Ambulatory Visit: Payer: Self-pay

## 2020-07-29 ENCOUNTER — Encounter: Payer: Self-pay | Admitting: Family Medicine

## 2020-07-29 ENCOUNTER — Ambulatory Visit (INDEPENDENT_AMBULATORY_CARE_PROVIDER_SITE_OTHER): Payer: PPO | Admitting: Family Medicine

## 2020-07-29 VITALS — BP 112/72 | HR 79 | Temp 97.3°F | Ht 67.0 in | Wt 216.0 lb

## 2020-07-29 DIAGNOSIS — M1A9XX0 Chronic gout, unspecified, without tophus (tophi): Secondary | ICD-10-CM

## 2020-07-29 DIAGNOSIS — I10 Essential (primary) hypertension: Secondary | ICD-10-CM | POA: Diagnosis not present

## 2020-07-29 DIAGNOSIS — N400 Enlarged prostate without lower urinary tract symptoms: Secondary | ICD-10-CM | POA: Diagnosis not present

## 2020-07-29 DIAGNOSIS — K219 Gastro-esophageal reflux disease without esophagitis: Secondary | ICD-10-CM | POA: Diagnosis not present

## 2020-07-29 DIAGNOSIS — E119 Type 2 diabetes mellitus without complications: Secondary | ICD-10-CM

## 2020-07-29 DIAGNOSIS — Z87442 Personal history of urinary calculi: Secondary | ICD-10-CM

## 2020-07-29 MED ORDER — ENALAPRIL MALEATE 10 MG PO TABS
10.0000 mg | ORAL_TABLET | Freq: Every day | ORAL | 1 refills | Status: DC
Start: 2020-07-29 — End: 2021-04-30

## 2020-07-29 MED ORDER — HYDROCHLOROTHIAZIDE 25 MG PO TABS: 12.5000 mg | ORAL_TABLET | Freq: Every day | ORAL | 1 refills | Status: DC

## 2020-07-29 MED ORDER — FINASTERIDE 5 MG PO TABS: 5.0000 mg | ORAL_TABLET | Freq: Every day | ORAL | 1 refills | Status: DC

## 2020-07-29 MED ORDER — ESOMEPRAZOLE MAGNESIUM 40 MG PO CPDR: DELAYED_RELEASE_CAPSULE | ORAL | 1 refills | Status: AC

## 2020-07-29 MED ORDER — ALLOPURINOL 300 MG PO TABS: 300.0000 mg | ORAL_TABLET | Freq: Every day | ORAL | 1 refills | Status: AC

## 2020-07-29 NOTE — Progress Notes (Signed)
 Patient ID: Mark Leonard, male    DOB: 10/21/1950, 70 y.o.   MRN: [id number]   Chief Complaint  Patient presents with  . Hypertension  . Diabetes    FBS 160 this morning    Subjective:    HPI  F/u htn and dm2-  Pt had surgery for in jan '22 for kidney stones. Had laser surgery.  Had some complications and sweling and blood clots in bladder.  Pt unable to urinate after the clotting and had to go to ER for flushing out bladder.  HTN Pt compliant with BP meds.  No SEs Denies chest pain, sob, LE swelling, or blurry vision. Pt is taking 10mg enalapril.   Dm2- Compliant with medications. Checking blood glucose.   Not seeing any high or low numbers.  Denies polyuria or polydipsia.  Eye exam: overdue Foot exam: no foot concerns  Glucose 150 in am glucose fasting. Has been eating more sweets candies, cakes. Used to be 120 and under.  On augmentin for ear infection on left ear, had sharp pain a week ago.  Finishing the abx.  Feels ear is improved.  Medical History Mark Leonard has a past medical history of Acid reflux, Allergy, Borderline diabetes, COVID (04/2019), Fatty liver, Gout, Heart murmur, History of kidney stones, Hypertension, Mitral regurgitation, Prostate hypertrophy, Venous stasis, and Wears glasses.   Outpatient Encounter Medications as of 07/29/2020  Medication Sig  . allopurinol (ZYLOPRIM) 300 MG tablet Take 1 tablet (300 mg total) by mouth daily.  . [EXPIRED] amoxicillin-clavulanate (AUGMENTIN) 875-125 MG tablet Take 1 tablet by mouth 2 (two) times daily for 7 days.  . blood glucose meter kit and supplies KIT Dispense based on patient and insurance preference. Use daily as directed. (FOR ICD-10 code E11.9)  . chlorzoxazone (PARAFON) 500 MG tablet Take 1 tablet (500 mg total) by mouth 3 (three) times daily as needed.  . enalapril (VASOTEC) 10 MG tablet Take 1 tablet (10 mg total) by mouth daily.  . esomeprazole (NEXIUM) 40 MG capsule TAKE 1 CAPSULE DAILY  BEFOREBREAKFAST  . finasteride (PROSCAR) 5 MG tablet Take 1 tablet (5 mg total) by mouth daily.  . glucose blood test strip Use to test blood sugar once daily. Use as instructed. One touch, verio strips. Dx:E11.9  . hydrochlorothiazide (HYDRODIURIL) 25 MG tablet Take 0.5 tablets (12.5 mg total) by mouth daily.  . metroNIDAZOLE (METROCREAM) 0.75 % cream APPLY CREAM TOPICALLY TWICE DAILY AS NEEDED (Patient taking differently: Apply 1 application topically as needed.)  . Multiple Vitamins-Minerals (MULTIVITAMIN MEN 50+ PO) Take 1 tablet by mouth daily.   . [DISCONTINUED] allopurinol (ZYLOPRIM) 300 MG tablet Take 1 tablet (300 mg total) by mouth daily.  . [DISCONTINUED] enalapril (VASOTEC) 20 MG tablet Take 1 tablet (20 mg total) by mouth at bedtime. (Patient taking differently: Take 10 mg by mouth daily.)  . [DISCONTINUED] esomeprazole (NEXIUM) 40 MG capsule TAKE 1 CAPSULE DAILY BEFOREBREAKFAST  . [DISCONTINUED] finasteride (PROSCAR) 5 MG tablet Take 1 tablet (5 mg total) by mouth daily.  . [DISCONTINUED] hydrochlorothiazide (HYDRODIURIL) 25 MG tablet Take 0.5 tablets (12.5 mg total) by mouth daily.  . [DISCONTINUED] ketorolac (TORADOL) 10 MG tablet Take 1 tablet (10 mg total) by mouth every 8 (eight) hours as needed for moderate pain. Or stent discomfort post-operatively.  . [DISCONTINUED] oxyCODONE-acetaminophen (PERCOCET) 5-325 MG tablet Take 1 tablet by mouth every 8 (eight) hours as needed for severe pain. Post-operatively   No facility-administered encounter medications on file as of 07/29/2020.       Review of Systems  Constitutional: Negative for chills and fever.  HENT: Negative for congestion, rhinorrhea and sore throat.   Respiratory: Negative for cough, shortness of breath and wheezing.   Cardiovascular: Negative for chest pain and leg swelling.  Gastrointestinal: Negative for abdominal pain, diarrhea, nausea and vomiting.  Genitourinary: Negative for dysuria and frequency.  Skin:  Negative for rash.  Neurological: Negative for dizziness, weakness and headaches.     Vitals BP 112/72   Pulse 79   Temp (!) 97.3 F (36.3 C)   Ht 5' 7" (1.702 m)   Wt 216 lb (98 kg)   SpO2 100%   BMI 33.83 kg/m   Objective:   Physical Exam Vitals and nursing note reviewed.  Constitutional:      General: He is not in acute distress.    Appearance: Normal appearance. He is not ill-appearing.  HENT:     Head: Normocephalic.     Right Ear: Tympanic membrane normal.     Left Ear: Tympanic membrane normal.     Nose: Nose normal. No congestion.     Mouth/Throat:     Mouth: Mucous membranes are moist.     Pharynx: No oropharyngeal exudate.  Eyes:     Extraocular Movements: Extraocular movements intact.     Conjunctiva/sclera: Conjunctivae normal.     Pupils: Pupils are equal, round, and reactive to light.  Cardiovascular:     Rate and Rhythm: Normal rate and regular rhythm.     Pulses: Normal pulses.     Heart sounds: Normal heart sounds. No murmur heard.   Pulmonary:     Effort: Pulmonary effort is normal.     Breath sounds: Normal breath sounds. No wheezing, rhonchi or rales.  Musculoskeletal:        General: Normal range of motion.     Right lower leg: No edema.     Left lower leg: No edema.  Skin:    General: Skin is warm and dry.     Findings: No rash.  Neurological:     General: No focal deficit present.     Mental Status: He is alert and oriented to person, place, and time.     Cranial Nerves: No cranial nerve deficit.  Psychiatric:        Mood and Affect: Mood normal.        Behavior: Behavior normal.        Thought Content: Thought content normal.        Judgment: Judgment normal.      Assessment and Plan   1. Essential hypertension, benign - CBC - CMP14+EGFR  2. Type 2 diabetes mellitus without complication, without long-term current use of insulin (HCC) - CBC - CMP14+EGFR - Hemoglobin A1c - Lipid panel - Microalbumin, urine  3. Prostate  hypertrophy - PSA  4. Gastroesophageal reflux disease, unspecified whether esophagitis present  5. Chronic gout without tophus, unspecified cause, unspecified site  6. History of nephrolithiasis   htn- stable. Cont meds.  DM2- recheck labs.  Cont meds.  BPH- recheck psa. Cont proscar.  Gout- stable. Cont allopurinol.   gerd- stable.  Con nexium.  H/o nephrolithiasis- cont f/u urology.  Return in about 6 months (around 01/28/2021) for fu dm2, gout, reflux, htn.

## 2020-07-30 LAB — CBC
Hematocrit: 46 % (ref 37.5–51.0)
Hemoglobin: 15.6 g/dL (ref 13.0–17.7)
MCH: 31.7 pg (ref 26.6–33.0)
MCHC: 33.9 g/dL (ref 31.5–35.7)
MCV: 94 fL (ref 79–97)
Platelets: 161 10*3/uL (ref 150–450)
RBC: 4.92 x10E6/uL (ref 4.14–5.80)
RDW: 14 % (ref 11.6–15.4)
WBC: 7 10*3/uL (ref 3.4–10.8)

## 2020-07-30 LAB — CMP14+EGFR
ALT: 50 IU/L — ABNORMAL HIGH (ref 0–44)
AST: 63 IU/L — ABNORMAL HIGH (ref 0–40)
Albumin/Globulin Ratio: 1.3 (ref 1.2–2.2)
Albumin: 4.1 g/dL (ref 3.8–4.8)
Alkaline Phosphatase: 148 IU/L — ABNORMAL HIGH (ref 44–121)
BUN/Creatinine Ratio: 17 (ref 10–24)
BUN: 19 mg/dL (ref 8–27)
Bilirubin Total: 0.4 mg/dL (ref 0.0–1.2)
CO2: 21 mmol/L (ref 20–29)
Calcium: 9.9 mg/dL (ref 8.6–10.2)
Chloride: 101 mmol/L (ref 96–106)
Creatinine, Ser: 1.09 mg/dL (ref 0.76–1.27)
Globulin, Total: 3.2 g/dL (ref 1.5–4.5)
Glucose: 115 mg/dL — ABNORMAL HIGH (ref 65–99)
Potassium: 5.4 mmol/L — ABNORMAL HIGH (ref 3.5–5.2)
Sodium: 141 mmol/L (ref 134–144)
Total Protein: 7.3 g/dL (ref 6.0–8.5)
eGFR: 74 mL/min/{1.73_m2} (ref >59)

## 2020-07-30 LAB — MICROALBUMIN, URINE: Microalbumin, Urine: <3 ug/mL

## 2020-07-30 LAB — HEMOGLOBIN A1C
Est. average glucose Bld gHb Est-mCnc: 171 mg/dL
Hgb A1c MFr Bld: 7.6 % — ABNORMAL HIGH (ref 4.8–5.6)

## 2020-07-30 LAB — LIPID PANEL
Chol/HDL Ratio: 2.4 ratio (ref 0.0–5.0)
Cholesterol, Total: 100 mg/dL (ref 100–199)
HDL: 41 mg/dL (ref >39)
LDL Chol Calc (NIH): 44 mg/dL (ref 0–99)
Triglycerides: 68 mg/dL (ref 0–149)
VLDL Cholesterol Cal: 15 mg/dL (ref 5–40)

## 2020-07-30 LAB — PSA: Prostate Specific Ag, Serum: 0.4 ng/mL (ref 0.0–4.0)

## 2020-07-31 ENCOUNTER — Telehealth: Payer: Self-pay | Admitting: Family Medicine

## 2020-07-31 MED ORDER — METFORMIN HCL ER 500 MG PO TB24: 500.0000 mg | ORAL_TABLET | Freq: Every day | ORAL | 0 refills | Status: DC

## 2020-07-31 NOTE — Telephone Encounter (Signed)
Patient notified and verbalized understanding. 

## 2020-09-30 ENCOUNTER — Telehealth: Payer: Self-pay | Admitting: Family Medicine

## 2020-09-30 MED ORDER — METFORMIN HCL ER 500 MG PO TB24: 500.0000 mg | ORAL_TABLET | Freq: Every day | ORAL | 1 refills | Status: DC

## 2020-09-30 NOTE — Telephone Encounter (Signed)
Refill sent to pharmacy and pt is aware. 

## 2020-09-30 NOTE — Telephone Encounter (Signed)
Patient is requesting refill on meformin 500 mg was last seen 07/29/20 sent elixir mail order

## 2020-09-30 NOTE — Telephone Encounter (Signed)
Please advise. Thank you

## 2020-11-19 DIAGNOSIS — L570 Actinic keratosis: Secondary | ICD-10-CM | POA: Diagnosis not present

## 2020-11-19 DIAGNOSIS — Z1283 Encounter for screening for malignant neoplasm of skin: Secondary | ICD-10-CM | POA: Diagnosis not present

## 2020-11-19 DIAGNOSIS — L719 Rosacea, unspecified: Secondary | ICD-10-CM | POA: Diagnosis not present

## 2021-01-02 DIAGNOSIS — R011 Cardiac murmur, unspecified: Secondary | ICD-10-CM | POA: Diagnosis not present

## 2021-01-02 DIAGNOSIS — I10 Essential (primary) hypertension: Secondary | ICD-10-CM | POA: Diagnosis not present

## 2021-01-02 DIAGNOSIS — K219 Gastro-esophageal reflux disease without esophagitis: Secondary | ICD-10-CM | POA: Diagnosis not present

## 2021-01-02 DIAGNOSIS — M109 Gout, unspecified: Secondary | ICD-10-CM | POA: Diagnosis not present

## 2021-01-02 DIAGNOSIS — L719 Rosacea, unspecified: Secondary | ICD-10-CM | POA: Diagnosis not present

## 2021-01-02 DIAGNOSIS — Z87442 Personal history of urinary calculi: Secondary | ICD-10-CM | POA: Diagnosis not present

## 2021-01-02 DIAGNOSIS — E1165 Type 2 diabetes mellitus with hyperglycemia: Secondary | ICD-10-CM | POA: Diagnosis not present

## 2021-01-02 DIAGNOSIS — N400 Enlarged prostate without lower urinary tract symptoms: Secondary | ICD-10-CM | POA: Diagnosis not present

## 2021-01-27 DIAGNOSIS — H524 Presbyopia: Secondary | ICD-10-CM | POA: Diagnosis not present

## 2021-01-27 DIAGNOSIS — E11319 Type 2 diabetes mellitus with unspecified diabetic retinopathy without macular edema: Secondary | ICD-10-CM | POA: Diagnosis not present

## 2021-02-13 ENCOUNTER — Ambulatory Visit
Admission: EM | Admit: 2021-02-13 | Discharge: 2021-02-13 | Disposition: A | Discharge status: Home / Self Care | Payer: PPO | Attending: Family Medicine | Admitting: Family Medicine

## 2021-02-13 ENCOUNTER — Other Ambulatory Visit: Payer: Self-pay

## 2021-02-13 ENCOUNTER — Encounter: Payer: Self-pay | Admitting: Emergency Medicine

## 2021-02-13 DIAGNOSIS — J3489 Other specified disorders of nose and nasal sinuses: Secondary | ICD-10-CM

## 2021-02-13 DIAGNOSIS — J069 Acute upper respiratory infection, unspecified: Secondary | ICD-10-CM

## 2021-02-13 HISTORY — DX: Type 2 diabetes mellitus without complications: E11.9

## 2021-02-13 MED ORDER — METHYLPREDNISOLONE SODIUM SUCC 125 MG IJ SOLR
125.0000 mg | Freq: Once | INTRAMUSCULAR | Status: AC
  Administered 2021-02-13: 125 mg via INTRAMUSCULAR

## 2021-02-13 NOTE — ED Triage Notes (Signed)
Patient c/o nasal congestion x 4 days.   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   Patient denies fever or chills.   Patient endorses sinus pressure at times.   Patient took Robitussin with no relief of symptoms.

## 2021-02-13 NOTE — ED Provider Notes (Signed)
[name]   [id number] 02/13/21 Arrival Time: 7829  ASSESSMENT & PLAN:  1. Viral URI with cough   2. Sinus pressure    Discussed typical duration of viral illnesses. OTC symptom care as needed.  Meds ordered this encounter  Medications   methylPREDNISolone sodium succinate (SOLU-MEDROL) 125 mg/2 mL injection 125 mg     Follow-up Information     Practice, [facility].   Why: As needed. Contact information: [address] [place] [address] [phone]                 Reviewed expectations re: course of current medical issues. Questions answered. Outlined signs and symptoms indicating need for more acute intervention. Understanding verbalized. After Visit Summary given.   SUBJECTIVE: History from: patient. Mark Leonard is a 70 y.o. male who reports: nasal congestion, sinus pressure, cough; x 4 days; abrupt onset. Denies: fever. Normal PO intake without n/v/d.   OBJECTIVE:  Vitals:   02/13/21 0823  BP: (!) 155/76  Pulse: 63  Resp: 18  Temp: 98.2 F (36.8 C)  TempSrc: Oral  SpO2: 98%    General appearance: alert; no distress Eyes: PERRLA; EOMI; conjunctiva normal HENT: [place]; AT; with nasal congestion Neck: supple  Lungs: speaks full sentences without difficulty; unlabored; clear Extremities: no edema Skin: warm and dry Neurologic: normal gait Psychological: alert and cooperative; normal mood and affect   Allergies  Allergen Reactions   Tape Other (See Comments)    Adhesive Lehman Prom    Past Medical History:  Diagnosis Date   Acid reflux    Allergy    Borderline diabetes    checks cbg 3 x week   COVID 04/2019   stuffy head sinus problems sob fatigue x 2 weeks   Diabetes mellitus without complication (HCC)    Fatty liver    Gout    Heart murmur    mild no cardiologist for last 20 yrs    History of kidney stones    Hypertension    Mitral regurgitation    mild    Prostate hypertrophy    Venous stasis    legs brown no  vein doctor   Wears glasses    Social History   Socioeconomic History   Marital status: Married    Spouse name: Not on file   Number of children: Not on file   Years of education: Not on file   Highest education level: Not on file  Occupational History   Not on file  Tobacco Use   Smoking status: Former    Packs/day: 2.00    Years: 26.00    Pack years: 52.00    Types: Cigarettes    Quit date: 04/04/1999    Years since quitting: 21.8   Smokeless tobacco: Former    Quit date: 05/26/1998  Vaping Use   Vaping Use: Never used  Substance and Sexual Activity   Alcohol use: No    Alcohol/week: 0.0 standard drinks   Drug use: No   Sexual activity: Not on file  Other Topics Concern   Not on file  Social History Narrative   Not on file   Social Determinants of Health   Financial Resource Strain: Not on file  Food Insecurity: Not on file  Transportation Needs: Not on file  Physical Activity: Not on file  Stress: Not on file  Social Connections: Not on file  Intimate Partner Violence: Not on file   Family History  Problem Relation Age of Onset  Hypertension Mother    Pulmonary disease Father    Colon cancer Neg Hx    Past Surgical History:  Procedure Laterality Date   CARDIOVASCULAR STRESS TEST     CHOLECYSTECTOMY  late 1990's   COLONOSCOPY     COLONOSCOPY N/A 05/17/2019   Procedure: COLONOSCOPY;  Surgeon: Daneil Dolin, MD;  [facility];  Service: Endoscopy;  Laterality: N/A;  8:30   CYSTOSCOPY WITH LITHOLAPAXY N/A 04/12/2020   Procedure: CYSTOSCOPY WITH  BLADDER AND KIDNEY LITHOLAPAXY;  Surgeon: Alexis Frock, MD;  Location: [facility];  Service: Urology;  Laterality: N/A;   CYSTOSCOPY WITH RETROGRADE PYELOGRAM, URETEROSCOPY AND STENT PLACEMENT Bilateral 04/12/2020   Procedure: CYSTOSCOPY WITH RETROGRADE PYELOGRAM, URETEROSCOPY AND STENT PLACEMENT;  Surgeon: Alexis Frock, MD;  Location: [facility];  Service: Urology;   Laterality: Bilateral;   EXTRACORPOREAL SHOCK WAVE LITHOTRIPSY  last done 18 yrs ago   4-5 times   HOLMIUM LASER APPLICATION Bilateral 09/08/4648   Procedure: HOLMIUM LASER APPLICATION;  Surgeon: Alexis Frock, MD;  Location: [facility];  Service: Urology;  Laterality: Bilateral;   KIDNEY STONE SURGERY  1970's done x 2   POLYPECTOMY  05/17/2019   Procedure: POLYPECTOMY;  Surgeon: Daneil Dolin, MD;  [facility]: [facility];  Service: Endoscopy;;   VASECTOMY  late 1990's     [name], MD 02/13/21 [phone]

## 2021-02-20 DIAGNOSIS — R011 Cardiac murmur, unspecified: Secondary | ICD-10-CM | POA: Diagnosis not present

## 2021-02-20 DIAGNOSIS — I10 Essential (primary) hypertension: Secondary | ICD-10-CM | POA: Diagnosis not present

## 2021-02-20 DIAGNOSIS — Z13220 Encounter for screening for lipoid disorders: Secondary | ICD-10-CM | POA: Diagnosis not present

## 2021-02-20 DIAGNOSIS — K219 Gastro-esophageal reflux disease without esophagitis: Secondary | ICD-10-CM | POA: Diagnosis not present

## 2021-02-20 DIAGNOSIS — E1165 Type 2 diabetes mellitus with hyperglycemia: Secondary | ICD-10-CM | POA: Diagnosis not present

## 2021-02-25 DIAGNOSIS — J069 Acute upper respiratory infection, unspecified: Secondary | ICD-10-CM | POA: Diagnosis not present

## 2021-02-25 DIAGNOSIS — E1165 Type 2 diabetes mellitus with hyperglycemia: Secondary | ICD-10-CM | POA: Diagnosis not present

## 2021-02-25 DIAGNOSIS — R7989 Other specified abnormal findings of blood chemistry: Secondary | ICD-10-CM | POA: Diagnosis not present

## 2021-03-25 DIAGNOSIS — Z1159 Encounter for screening for other viral diseases: Secondary | ICD-10-CM | POA: Diagnosis not present

## 2021-03-25 DIAGNOSIS — N400 Enlarged prostate without lower urinary tract symptoms: Secondary | ICD-10-CM | POA: Diagnosis not present

## 2021-03-25 DIAGNOSIS — R7989 Other specified abnormal findings of blood chemistry: Secondary | ICD-10-CM | POA: Diagnosis not present

## 2021-03-25 DIAGNOSIS — I10 Essential (primary) hypertension: Secondary | ICD-10-CM | POA: Diagnosis not present

## 2021-03-26 DIAGNOSIS — E119 Type 2 diabetes mellitus without complications: Secondary | ICD-10-CM | POA: Diagnosis not present

## 2021-03-26 DIAGNOSIS — Z0000 Encounter for general adult medical examination without abnormal findings: Secondary | ICD-10-CM | POA: Diagnosis not present

## 2021-03-26 DIAGNOSIS — K760 Fatty (change of) liver, not elsewhere classified: Secondary | ICD-10-CM | POA: Diagnosis not present

## 2021-03-26 DIAGNOSIS — M109 Gout, unspecified: Secondary | ICD-10-CM | POA: Diagnosis not present

## 2021-03-26 DIAGNOSIS — I951 Orthostatic hypotension: Secondary | ICD-10-CM | POA: Diagnosis not present

## 2021-03-26 DIAGNOSIS — I10 Essential (primary) hypertension: Secondary | ICD-10-CM | POA: Diagnosis not present

## 2021-03-26 DIAGNOSIS — K219 Gastro-esophageal reflux disease without esophagitis: Secondary | ICD-10-CM | POA: Diagnosis not present

## 2021-03-26 DIAGNOSIS — N400 Enlarged prostate without lower urinary tract symptoms: Secondary | ICD-10-CM | POA: Diagnosis not present

## 2021-04-16 ENCOUNTER — Ambulatory Visit: Payer: PPO | Admitting: Nurse Practitioner

## 2021-04-30 ENCOUNTER — Ambulatory Visit
Admission: EM | Admit: 2021-04-30 | Discharge: 2021-04-30 | Disposition: A | Discharge status: Home / Self Care | Payer: PPO | Attending: Family Medicine | Admitting: Family Medicine

## 2021-04-30 ENCOUNTER — Other Ambulatory Visit: Payer: Self-pay

## 2021-04-30 ENCOUNTER — Encounter: Payer: Self-pay | Admitting: Emergency Medicine

## 2021-04-30 DIAGNOSIS — Z1152 Encounter for screening for COVID-19: Secondary | ICD-10-CM

## 2021-04-30 DIAGNOSIS — J069 Acute upper respiratory infection, unspecified: Secondary | ICD-10-CM | POA: Diagnosis not present

## 2021-04-30 MED ORDER — PROMETHAZINE-DM 6.25-15 MG/5ML PO SYRP
5.0000 mL | ORAL_SOLUTION | Freq: Four times a day (QID) | ORAL | 0 refills | Status: DC | PRN
Start: 2021-04-30 — End: 2021-10-13

## 2021-04-30 NOTE — ED Triage Notes (Signed)
Pt reports chills, bilateral ear pain, sinus pressure, emesis x1 "from sinus drainage" since last night. Pt denies any known fevers.

## 2021-04-30 NOTE — ED Provider Notes (Signed)
[place]   [id number] 04/30/21 Arrival Time: 2229  ASSESSMENT & PLAN:  1. Viral URI with cough   2. Encounter for screening for COVID-19    Discussed typical duration of viral illnesses. Viral testing sent. OTC symptom care as needed.  Discharge Medication List as of 04/30/2021 11:32 AM     START taking these medications   Details  promethazine-dextromethorphan (PROMETHAZINE-DM) 6.25-15 MG/5ML syrup Take 5 mLs by mouth 4 (four) times daily as needed for cough., Starting Wed 04/30/2021, Normal         Follow-up Information     [facility].   Why: If worsening or failing to improve as anticipated. Contact information: [address] [place] [address] [phone]                 Reviewed expectations re: course of current medical issues. Questions answered. Outlined signs and symptoms indicating need for more acute intervention. Understanding verbalized. After Visit Summary given.   SUBJECTIVE: History from: patient. Mark Leonard is a 71 y.o. male who reports: chills, ear pain, sinus pressure; emesis x 1 related to post-nasal drainage. Denies: fever. Normal PO intake without n/v/d.  OBJECTIVE:  Vitals:   04/30/21 1100 04/30/21 1101  BP: 136/81   Pulse: (!) 114   Resp: 18   Temp: 100.3 F (37.9 C)   TempSrc: Oral   SpO2: 94%   Weight:  95.3 kg  Height:  5\' 8"  (1.727 m)    Tachycardia noted; no CP.  General appearance: alert; no distress Eyes: PERRLA; EOMI; conjunctiva normal HENT: Hominy; AT; with nasal congestion Neck: supple  Lungs: speaks full sentences without difficulty; unlabored; CTAB Extremities: no edema Skin: warm and dry Neurologic: normal gait Psychological: alert and cooperative; normal mood and affect  Labs: Labs Reviewed  COVID-19, FLU A+B NAA    Allergies  Allergen Reactions   Tape Other (See Comments)    Adhesive [employer]    Past Medical History:  Diagnosis Date   Acid reflux    Allergy     Borderline diabetes    checks cbg 3 x week   COVID 04/2019   stuffy head sinus problems sob fatigue x 2 weeks   Diabetes mellitus without complication (HCC)    Fatty liver    Gout    Heart murmur    mild no cardiologist for last 20 yrs    History of kidney stones    Hypertension    Mitral regurgitation    mild    Prostate hypertrophy    Venous stasis    legs brown no vein doctor   Wears glasses    Social History   Socioeconomic History   Marital status: Married    Spouse name: Not on file   Number of children: Not on file   Years of education: Not on file   Highest education level: Not on file  Occupational History   Not on file  Tobacco Use   Smoking status: Former    Packs/day: 2.00    Years: 26.00    Pack years: 52.00    Types: Cigarettes    Quit date: 04/04/1999    Years since quitting: 22.0   Smokeless tobacco: Former    Quit date: 05/26/1998  Vaping Use   Vaping Use: Never used  Substance and Sexual Activity   Alcohol use: No    Alcohol/week: 0.0 standard drinks   Drug use: No   Sexual activity: Not on file  Other Topics  Concern   Not on file  Social History Narrative   Not on file   Social Determinants of Health   Financial Resource Strain: Not on file  Food Insecurity: Not on file  Transportation Needs: Not on file  Physical Activity: Not on file  Stress: Not on file  Social Connections: Not on file  Intimate Partner Violence: Not on file   Family History  Problem Relation Age of Onset   Hypertension Mother    Pulmonary disease Father    Colon cancer Neg Hx    Past Surgical History:  Procedure Laterality Date   CARDIOVASCULAR STRESS TEST     CHOLECYSTECTOMY  late 1990's   COLONOSCOPY     COLONOSCOPY N/A 05/17/2019   Procedure: COLONOSCOPY;  Surgeon: Daneil Dolin, MD;  [facility];  Service: Endoscopy;  Laterality: N/A;  8:30   CYSTOSCOPY WITH LITHOLAPAXY N/A 04/12/2020   Procedure: CYSTOSCOPY WITH  BLADDER AND KIDNEY  LITHOLAPAXY;  Surgeon: Alexis Frock, MD;  Location: [facility];  Service: Urology;  Laterality: N/A;   CYSTOSCOPY WITH RETROGRADE PYELOGRAM, URETEROSCOPY AND STENT PLACEMENT Bilateral 04/12/2020   Procedure: CYSTOSCOPY WITH RETROGRADE PYELOGRAM, URETEROSCOPY AND STENT PLACEMENT;  Surgeon: Alexis Frock, MD;  Location: [facility];  Service: Urology;  Laterality: Bilateral;   EXTRACORPOREAL SHOCK WAVE LITHOTRIPSY  last done 18 yrs ago   4-5 times   HOLMIUM LASER APPLICATION Bilateral 05/13/5168   Procedure: HOLMIUM LASER APPLICATION;  Surgeon: Alexis Frock, MD;  Location: [facility];  Service: Urology;  Laterality: Bilateral;   KIDNEY STONE SURGERY  1970's done x 2   POLYPECTOMY  05/17/2019   Procedure: POLYPECTOMY;  Surgeon: Daneil Dolin, MD;  [facility];  Service: Endoscopy;;   VASECTOMY  late 1990's     [name], MD 04/30/21 1643

## 2021-04-30 NOTE — Discharge Instructions (Signed)
You have been tested for COVID-19 today. °If your test returns positive, you will receive a phone call from [facility] regarding your results. °Negative test results are not called. °Both positive and negative results area always visible on MyChart. °If you do not have a MyChart account, sign up instructions are provided in your discharge papers. °Please do not hesitate to contact us should you have questions or concerns. ° °

## 2021-05-02 LAB — COVID-19, FLU A+B NAA
Influenza A, NAA: NOT DETECTED
Influenza B, NAA: NOT DETECTED
SARS-CoV-2, NAA: DETECTED — AB

## 2021-05-27 IMAGING — MR MR HIP*R* W/O CM
4 of 5 series · 13 of 40 positions shown · non-contrast
Comparison: None.

CLINICAL DATA: Right hip pain for 2 years

EXAM:
MR OF THE RIGHT HIP WITHOUT CONTRAST
TECHNIQUE: Multiplanar, multisequence MR imaging was performed. No intravenous
contrast was administered.

[Series 3: T1 · coronal · 4.0mm · 0.39mm/px · 3 of 26 slices shown]
[im 4/26]
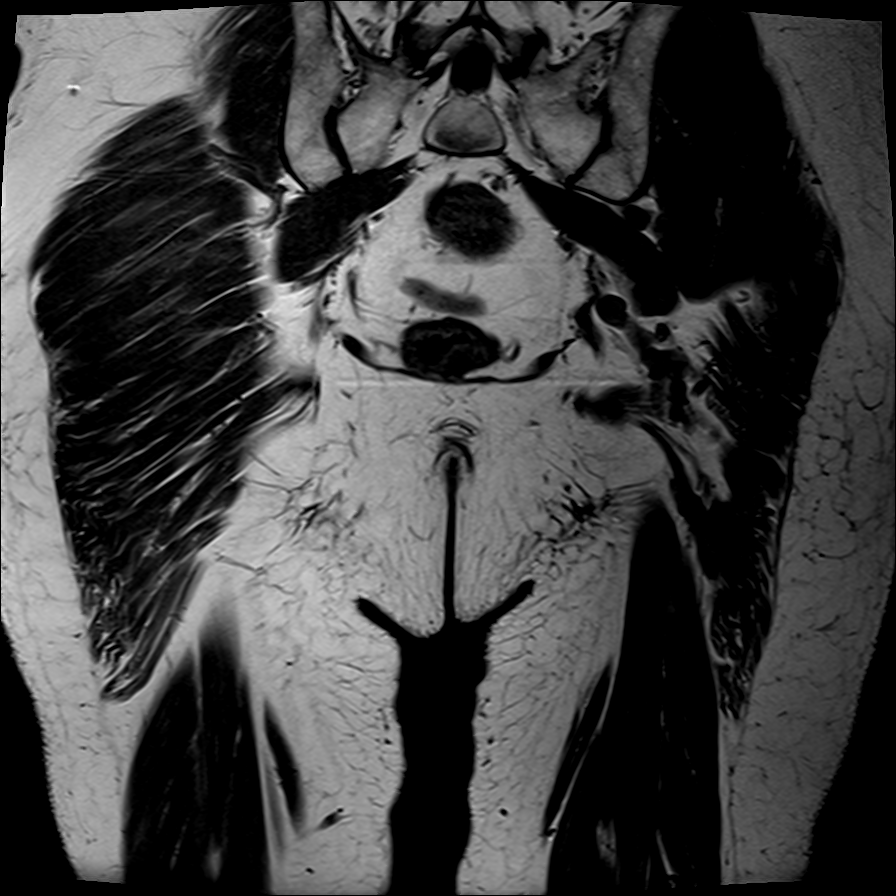
[im 13/26]
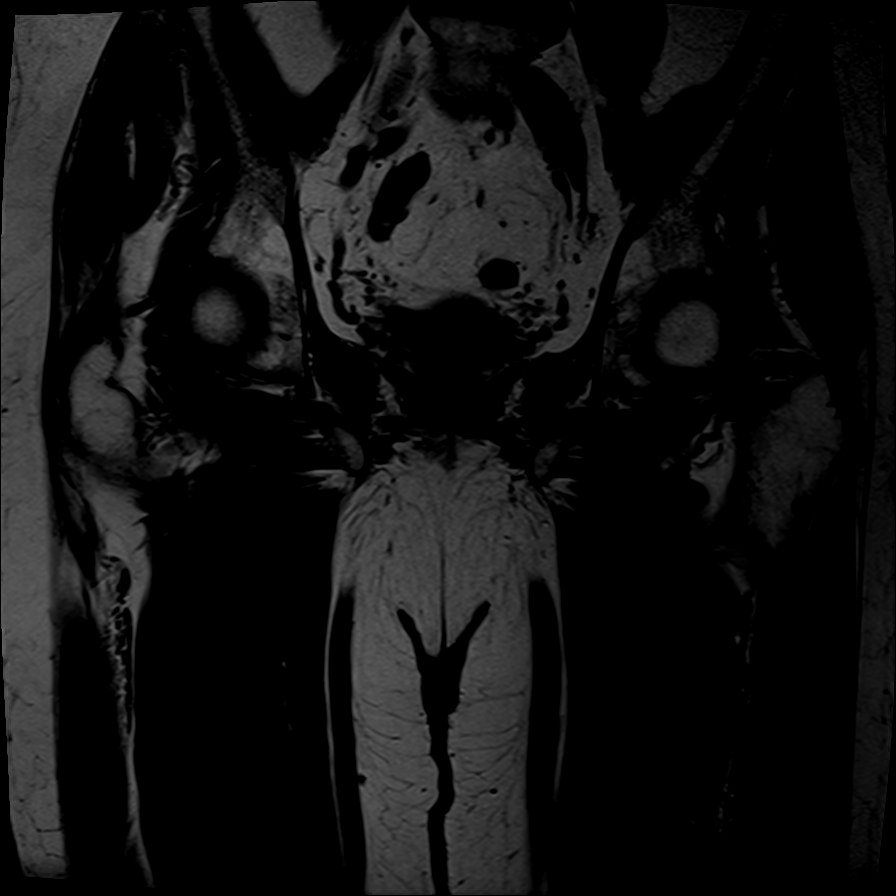
[im 22/26]
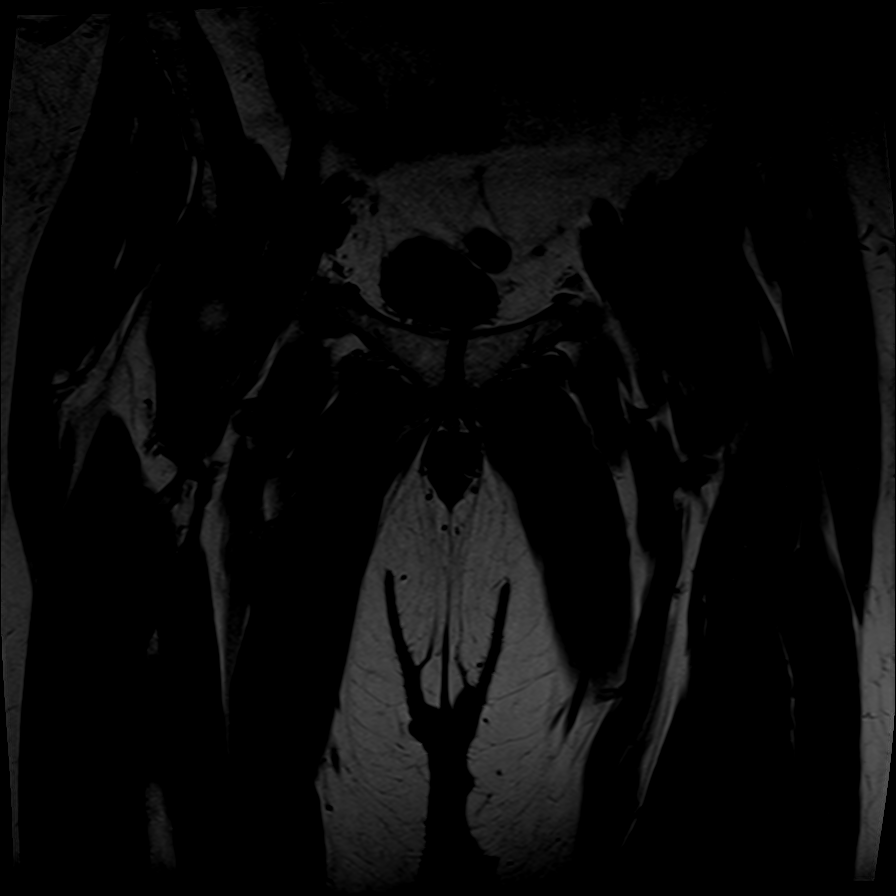

[Series 5: T2 fat-sat · axial · 4.0mm · 0.39mm/px · z∈[-11,+78]mm · 3 of 26 slices shown]
[im 4/26]
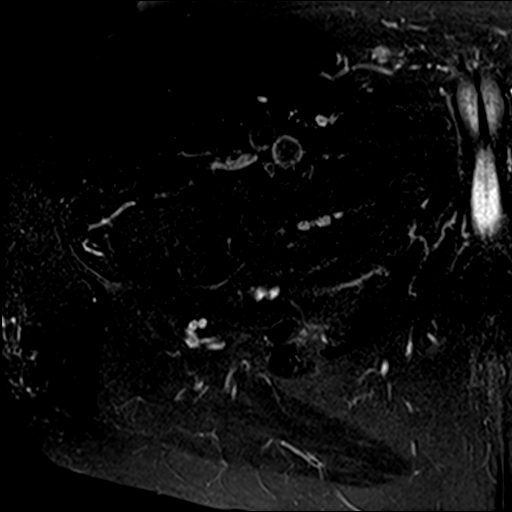
[im 15/26]
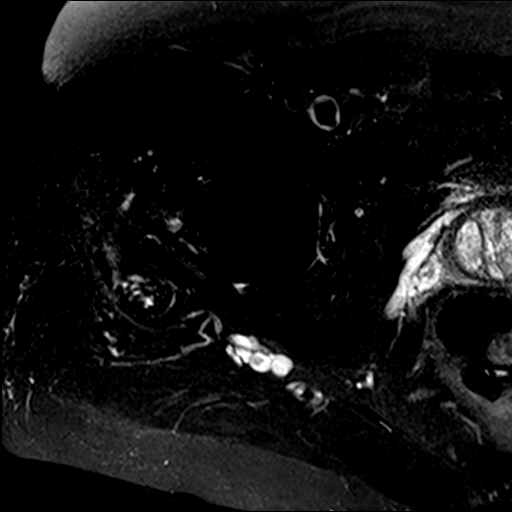
[im 22/26]
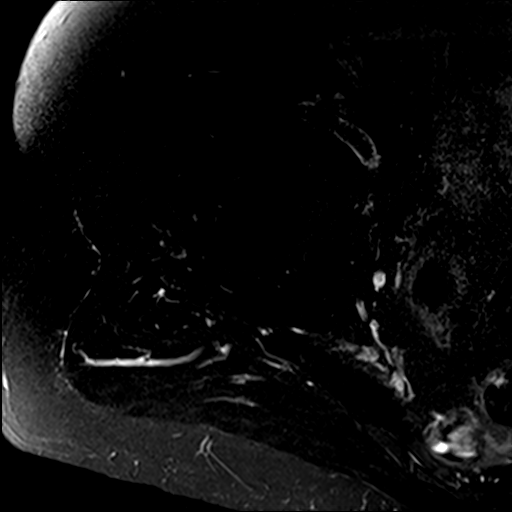

[Series 6: PD fat-sat · sagittal · 4.0mm · 0.35mm/px · 4 of 24 slices shown (1 of 2)]
[im 1/24]
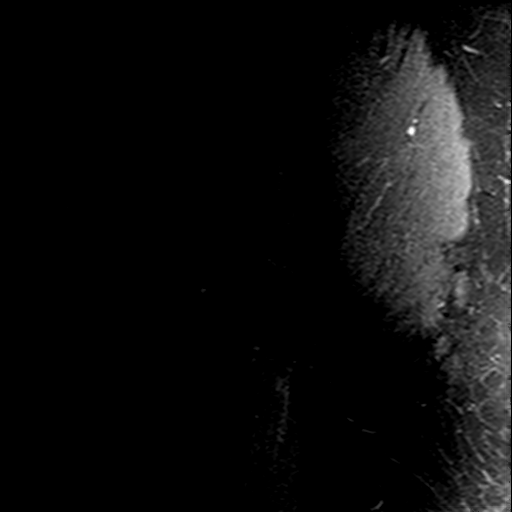
[im 4/24]
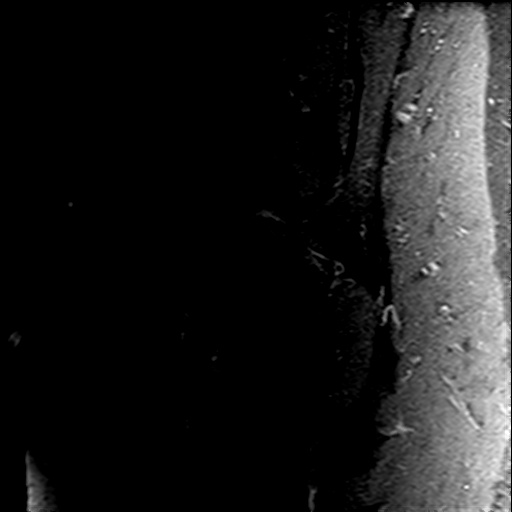
[im 14/24]
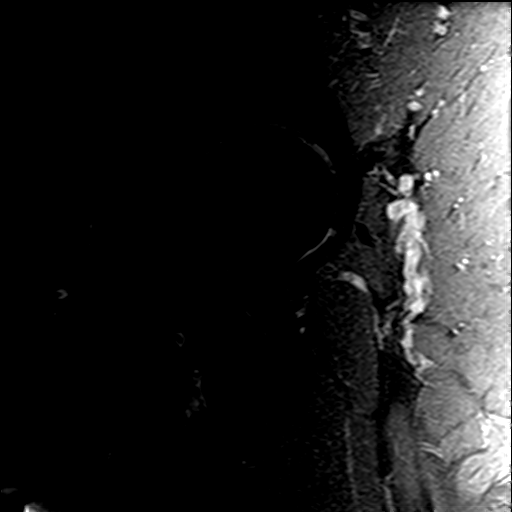
[im 20/24]
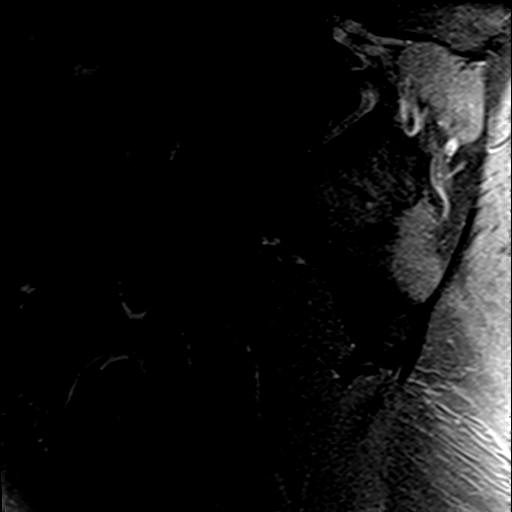

[Series 7: PD fat-sat · coronal · 4.0mm · 0.35mm/px · 3 of 22 slices shown (2 of 2)]
[im 4/22]
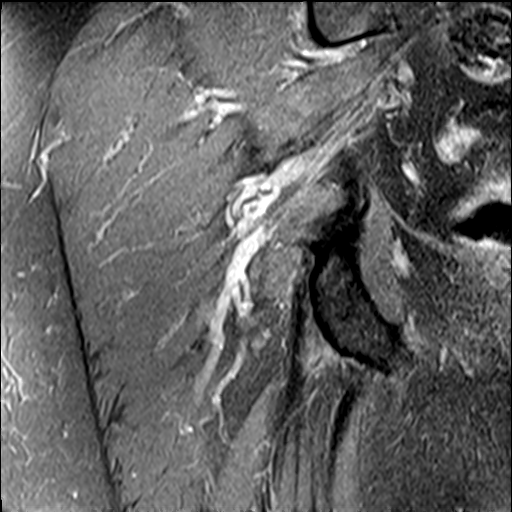
[im 11/22]
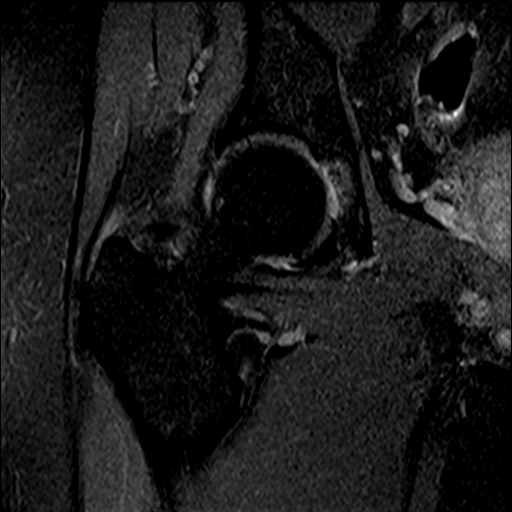
[im 18/22]
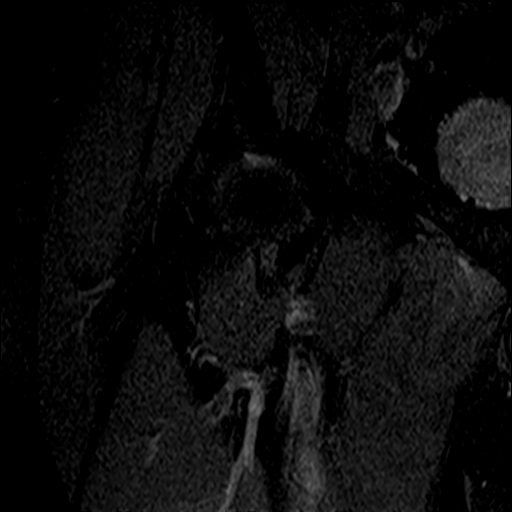

[13 of 40 positions shown; findings below may reference images not displayed]

FINDINGS: Bones: There is no evidence of acute fracture, dislocation or
avascular necrosis. The femoral head is normal in shape,
configuration and sphericity. No osseous bump at the femoral head
neck junction. The visualized bony pelvis appears normal. The
visualized sacroiliac joints and symphysis pubis appear normal.

Articular cartilage and labrum

Articular cartilage: Mild chondral thinning seen at the superior
femoroacetabular joint.

Labrum: There is no gross labral tear or paralabral abnormality.

Joint or bursal effusion

Joint effusion: No significant hip joint effusion.

Bursae: No focal periarticular fluid collection.

Muscles and tendons

Muscles and tendons: The muscles surrounding hip are normal in
appearance without evidence of tear or atrophy. Increased
intrasubstance signal seen at the insertion site of the hamstrings
tendon. There is a partial interstitial tear seen at the insertion
of the gluteus medius tendon. Mildly increased signal seen at the
gluteus minimus tendon insertion site. There is trace trochanteric
bursal fluid.

Other findings

Miscellaneous: The visualized internal pelvic contents appear
unremarkable.
IMPRESSION: Partial interstitial tear at the gluteus medius insertion site with
gluteal tendinosis and mild trochanteric bursitis.

Insertional hamstrings tendinosis.

Mild femoroacetabular joint osteoarthritis.

## 2021-09-23 ENCOUNTER — Encounter: Payer: Self-pay | Admitting: Internal Medicine

## 2021-10-11 NOTE — Progress Notes (Addendum)
Referring Provider: [facility] Primary Care Physician:  [facility] Primary GI Physician: Dr. Dr. Gala Leonard  No chief complaint on file.   HPI:   Mark Leonard is a 71 y.o. male presenting today with a history of Mark Bunting, MD for fatty liver.   We have not seen patient in our office, but he was seen for a screening colonoscopy in February 2021 which showed seven 4-6 mm polyps removed.  Pathology with tubular adenomas and 1 hyperplastic polyp.  Recommended 3-year repeat.  Reviewed labs in chart.  LFT elevation dates back at least to 2013.  Most recent labs ([facility]) 09/24/21 with AST 64, ALT 58, alk phos 143, total bilirubin 0.5.  Acute hepatitis panel negative in December 2022. Platelets normal last year .  US abdomen in 2007 with 2 possible nodularity of the liver the most likely representing steatosis.  Possible area of relative hypoechogenicity within the liver most likely indicative of fatty sparing.  MRI abdomen in 2007 with diffuse fatty liver, several small simple liver cyst.  Today:    Past Medical History:  Diagnosis Date   Acid reflux    Allergy    Borderline diabetes    checks cbg 3 x week   COVID 04/2019   stuffy head sinus problems sob fatigue x 2 weeks   Diabetes mellitus without complication (HCC)    Fatty liver    Gout    Heart murmur    mild no cardiologist for last 20 yrs    History of kidney stones    Hypertension    Mitral regurgitation    mild    Prostate hypertrophy    Venous stasis    legs brown no vein doctor   Wears glasses     Past Surgical History:  Procedure Laterality Date   CARDIOVASCULAR STRESS TEST     CHOLECYSTECTOMY  late 1990's   COLONOSCOPY     COLONOSCOPY N/A 05/17/2019   Procedure: COLONOSCOPY;  Surgeon: Daneil Dolin, MD;  [facility];  Service: Endoscopy;  Laterality: N/A;  8:30   CYSTOSCOPY WITH LITHOLAPAXY N/A 04/12/2020   Procedure: CYSTOSCOPY WITH  BLADDER AND KIDNEY  LITHOLAPAXY;  Surgeon: Alexis Frock, MD;  Location: [facility];  Service: Urology;  Laterality: N/A;   CYSTOSCOPY WITH RETROGRADE PYELOGRAM, URETEROSCOPY AND STENT PLACEMENT Bilateral 04/12/2020   Procedure: CYSTOSCOPY WITH RETROGRADE PYELOGRAM, URETEROSCOPY AND STENT PLACEMENT;  Surgeon: Alexis Frock, MD;  Location: [facility];  Service: Urology;  Laterality: Bilateral;   EXTRACORPOREAL SHOCK WAVE LITHOTRIPSY  last done 18 yrs ago   4-5 times   HOLMIUM LASER APPLICATION Bilateral 06/12/298   Procedure: HOLMIUM LASER APPLICATION;  Surgeon: Alexis Frock, MD;  Location: [facility];  Service: Urology;  Laterality: Bilateral;   KIDNEY STONE SURGERY  1970's done x 2   POLYPECTOMY  05/17/2019   Procedure: POLYPECTOMY;  Surgeon: Daneil Dolin, MD;  [facility];  Service: Endoscopy;;   VASECTOMY  late 1990's    Current Outpatient Medications  Medication Sig Dispense Refill   allopurinol (ZYLOPRIM) 300 MG tablet Take 1 tablet (300 mg total) by mouth daily. 90 tablet 1   blood glucose meter kit and supplies KIT Dispense based on patient and insurance preference. Use daily as directed. (FOR ICD-10 code E11.9) 1 each 5   chlorzoxazone (PARAFON) 500 MG tablet Take 1 tablet (500 mg total) by mouth 3 (three) times daily as needed. 30 tablet 0  esomeprazole (NEXIUM) 40 MG capsule TAKE 1 CAPSULE DAILY BEFOREBREAKFAST 90 capsule 1   finasteride (PROSCAR) 5 MG tablet Take 1 tablet (5 mg total) by mouth daily. 90 tablet 1   glucose blood test strip Use to test blood sugar once daily. Use as instructed. One touch, verio strips. Dx:E11.9 100 each 12   hydrochlorothiazide (HYDRODIURIL) 25 MG tablet Take 0.5 tablets (12.5 mg total) by mouth daily. 45 tablet 1   metFORMIN (GLUCOPHAGE XR) 500 MG 24 hr tablet Take 1 tablet (500 mg total) by mouth daily with breakfast. 90 tablet 1   Multiple Vitamins-Minerals (MULTIVITAMIN MEN 50+ PO) Take 1 tablet by  mouth daily.      promethazine-dextromethorphan (PROMETHAZINE-DM) 6.25-15 MG/5ML syrup Take 5 mLs by mouth 4 (four) times daily as needed for cough. 118 mL 0   No current facility-administered medications for this visit.    Allergies as of 10/13/2021 - Review Complete 04/30/2021  Allergen Reaction Noted   Tape Other (See Comments) 06/22/2012    Family History  Problem Relation Age of Onset   Hypertension Mother    Pulmonary disease Father    Colon cancer Neg Hx     Social History   Socioeconomic History   Marital status: Married    Spouse name: Not on file   Number of children: Not on file   Years of education: Not on file   Highest education level: Not on file  Occupational History   Not on file  Tobacco Use   Smoking status: Former    Packs/day: 2.00    Years: 26.00    Total pack years: 52.00    Types: Cigarettes    Quit date: 04/04/1999    Years since quitting: 22.5   Smokeless tobacco: Former    Quit date: 05/26/1998  Vaping Use   Vaping Use: Never used  Substance and Sexual Activity   Alcohol use: No    Alcohol/week: 0.0 standard drinks of alcohol   Drug use: No   Sexual activity: Not on file  Other Topics Concern   Not on file  Social History Narrative   Not on file   Social Determinants of Health   Financial Resource Strain: Not on file  Food Insecurity: Not on file  Transportation Needs: Not on file  Physical Activity: Not on file  Stress: Not on file  Social Connections: Not on file    Review of Systems: Gen: Denies fever, chills, cold or flu like symptoms, pre-syncope, or syncope.  CV: Denies chest pain, palpitations. Resp: Denies dyspnea, cough.  GI: See HPI Derm: Denies rash. Psych: Denies depression, anxiety. Heme: See HPI  Physical Exam: There were no vitals taken for this visit. General:   Alert and oriented. No distress noted. Pleasant and cooperative.  Head:  Normocephalic and atraumatic. Eyes:  Conjuctiva clear without scleral  icterus. Heart:  S1, S2 present without murmurs appreciated. Lungs:  Clear to auscultation bilaterally. No wheezes, rales, or rhonchi. No distress.  Abdomen:  +BS, soft, non-tender and non-distended. No rebound or guarding. No HSM or masses noted. Msk:  Symmetrical without gross deformities. Normal posture. Extremities:  Without edema. Neurologic:  Alert and  oriented x4 Psych:  Normal mood and affect.    Assessment:     Plan:  ***   Aliene Altes, PA-C [facility]enter[facility] 10/13/2021

## 2021-10-13 ENCOUNTER — Encounter: Payer: Self-pay | Admitting: Gastroenterology

## 2021-10-13 ENCOUNTER — Ambulatory Visit: Payer: PPO | Admitting: Gastroenterology

## 2021-10-13 VITALS — BP 134/75 | HR 87 | Temp 97.9°F | Ht 67.0 in | Wt 214.4 lb

## 2021-10-13 DIAGNOSIS — R7989 Other specified abnormal findings of blood chemistry: Secondary | ICD-10-CM | POA: Insufficient documentation

## 2021-10-13 DIAGNOSIS — K760 Fatty (change of) liver, not elsewhere classified: Secondary | ICD-10-CM

## 2021-10-13 NOTE — Patient Instructions (Addendum)
Please have blood work completed at [facility].  I am requesting your recent ultrasound report from your primary care doctor for review.  I will let you know if we need any additional imaging.  Instructions for fatty liver: Recommend 1-2# weight loss per week until ideal body weight through exercise & diet. Low fat/cholesterol diet.   Avoid sweets, sodas, fruit juices, sweetened beverages like tea, etc. Gradually increase exercise from 15 min daily up to 1 hr per day 5 days/week.  We we will plan to see back in 3 months.  Do not hesitate to call if you have any questions or concerns prior to your next visit.  It was very nice meeting you today!  Enjoy your retirement trip in September!  Aliene Altes, PA-C [facility] Gastroenter[facility]

## 2021-10-16 LAB — IGG, IGA, IGM
IgA/Immunoglobulin A, Serum: 568 mg/dL — ABNORMAL HIGH (ref 61–437)
IgG (Immunoglobin G), Serum: 1108 mg/dL (ref 603–1613)
IgM (Immunoglobulin M), Srm: 170 mg/dL (ref 20–172)

## 2021-10-16 LAB — ANA: Anti Nuclear Antibody (ANA): NEGATIVE

## 2021-10-16 LAB — IRON,TIBC AND FERRITIN PANEL
Ferritin: 345 ng/mL (ref 30–400)
Iron Saturation: 38 % (ref 15–55)
Iron: 117 ug/dL (ref 38–169)
Total Iron Binding Capacity: 309 ug/dL (ref 250–450)
UIBC: 192 ug/dL (ref 111–343)

## 2021-10-16 LAB — HEPATITIS B CORE ANTIBODY, TOTAL: Hep B Core Total Ab: NEGATIVE

## 2021-10-16 LAB — MITOCHONDRIAL ANTIBODIES: Mitochondrial Ab: <20 Units (ref 0.0–20.0)

## 2021-10-16 LAB — ANTI-SMOOTH MUSCLE ANTIBODY, IGG: Smooth Muscle Ab: 5 Units (ref 0–19)

## 2021-10-16 LAB — HEPATITIS A ANTIBODY, TOTAL: hep A Total Ab: POSITIVE — AB

## 2021-10-16 LAB — HEPATITIS B SURFACE ANTIBODY,QUALITATIVE: Hep B Surface Ab, Qual: NONREACTIVE

## 2021-10-16 LAB — TSH: TSH: 1.62 u[IU]/mL (ref 0.450–4.500)

## 2022-01-16 NOTE — Progress Notes (Unsigned)
Referring Provider: [facility] Primary Care Physician:  [facility] Primary GI Physician: Dr. Gala Romney  No chief complaint on file.   HPI:   Mark Leonard is a 71 y.o. male with history of type 2 diabetes, HTN, adenomatous colon polyps, acid reflux, fatty liver, chronically elevated liver enzymes, presenting today for follow-up of fatty liver and elevated liver enzymes.  LFT elevation dates back at least to 2013.  Acute hepatitis panel negative in December 2022.   Last seen in our office at the time of initial consult on 10/13/2021.  Noted LFT elevation dating back to 2013.  Acute hepatitis panel was negative in December 2022.  Prior ultrasound 2007 suggested possible nodularity but most likely steatosis.  Follow-up MRI in 2007 with diffuse fatty liver, several small simple cyst.  He reported having recent ultrasound, but I do not have a copy of this.  He was doing well without signs or symptoms of decompensated liver disease.  Denies history of alcohol use, drug use.  Maternal grandfather with nonalcoholic cirrhosis and subsequently passed from "liver cancer".  Cousin also had nonalcoholic cirrhosis and later passed from "liver cancer".  Mother and aunt with thyroid problems.  Overall, suspected elevated liver enzymes secondary to chronic fatty liver, but plan to complete additional serologic work-up.  He was counseled on fatty liver with recommendations for weight loss through diet and exercise.  Labs completed 10/13/2021: ANA, AMA, ASMA, negative.  IgM and IgG within normal limits.  Mild elevation of IgA at 568.  No immunity to hepatitis B.  Immune to hepatitis A.  Iron panel within normal limits.  TSH normal. -Recommended hepatitis B vaccination.  Received ultrasound report dated 09/17/2021 showing hepatomegaly (liver 20.4 cm) with steatosis, simple hepatic cyst measuring less than 1 cm, no worrisome masses or lesions, patent portal vein, normal  spleen.  Today:  Weight:  A1C:   Most recent LFTs 09/24/2021 with AST 64, ALT 58, alk phos 143, total bilirubin 0.5.  Past Medical History:  Diagnosis Date   Acid reflux    Allergy    COVID 04/2019   stuffy head sinus problems sob fatigue x 2 weeks   Diabetes mellitus without complication (HCC)    Elevated LFTs    chronic   Fatty liver    Gout    Heart murmur    mild no cardiologist for last 20 yrs    History of kidney stones    Hypertension    Mitral regurgitation    mild    Prostate hypertrophy    Venous stasis    legs brown no vein doctor   Wears glasses     Past Surgical History:  Procedure Laterality Date   CARDIOVASCULAR STRESS TEST     CHOLECYSTECTOMY  late 1990's   COLONOSCOPY     COLONOSCOPY N/A 05/17/2019   Surgeon: Daneil Dolin, MD;  seven 4-6 mm polyps removed.  Pathology with tubular adenomas and 1 hyperplastic polyp.  Recommended 3-year repeat.   CYSTOSCOPY WITH LITHOLAPAXY N/A 04/12/2020   Procedure: CYSTOSCOPY WITH  BLADDER AND KIDNEY LITHOLAPAXY;  Surgeon: Alexis Frock, MD;  Location: [facility];  Service: Urology;  Laterality: N/A;   CYSTOSCOPY WITH RETROGRADE PYELOGRAM, URETEROSCOPY AND STENT PLACEMENT Bilateral 04/12/2020   Procedure: CYSTOSCOPY WITH RETROGRADE PYELOGRAM, URETEROSCOPY AND STENT PLACEMENT;  Surgeon: Alexis Frock, MD;  Location: [facility];  Service: Urology;  Laterality: Bilateral;   EXTRACORPOREAL SHOCK WAVE LITHOTRIPSY  last done 18 yrs ago  4-5 times   HOLMIUM LASER APPLICATION Bilateral 74/25/9563   Procedure: HOLMIUM LASER APPLICATION;  Surgeon: Alexis Frock, MD;  Location: [facility];  Service: Urology;  Laterality: Bilateral;   KIDNEY STONE SURGERY  1970's done x 2   POLYPECTOMY  05/17/2019   Procedure: POLYPECTOMY;  Surgeon: Daneil Dolin, MD;  [facility];  Service: Endoscopy;;   VASECTOMY  late 1990's    Current Outpatient Medications   Medication Sig Dispense Refill   allopurinol (ZYLOPRIM) 300 MG tablet Take 1 tablet (300 mg total) by mouth daily. 90 tablet 1   blood glucose meter kit and supplies KIT Dispense based on patient and insurance preference. Use daily as directed. (FOR ICD-10 code E11.9) 1 each 5   chlorzoxazone (PARAFON) 500 MG tablet Take 1 tablet (500 mg total) by mouth 3 (three) times daily as needed. 30 tablet 0   enalapril (VASOTEC) 20 MG tablet Take 10 mg by mouth daily.     esomeprazole (NEXIUM) 40 MG capsule TAKE 1 CAPSULE DAILY BEFOREBREAKFAST 90 capsule 1   finasteride (PROSCAR) 5 MG tablet Take 1 tablet (5 mg total) by mouth daily. 90 tablet 1   glucose blood test strip Use to test blood sugar once daily. Use as instructed. One touch, verio strips. Dx:E11.9 100 each 12   metFORMIN (GLUCOPHAGE XR) 500 MG 24 hr tablet Take 1 tablet (500 mg total) by mouth daily with breakfast. 90 tablet 1   metroNIDAZOLE (METROCREAM) 0.75 % cream Apply 1 Application topically daily.     Multiple Vitamin (MULTIVITAMIN) tablet Take 1 tablet by mouth daily.     Multiple Vitamins-Minerals (MULTIVITAMIN MEN 50+ PO) Take 1 tablet by mouth daily.      No current facility-administered medications for this visit.    Allergies as of 01/19/2022 - Review Complete 10/13/2021  Allergen Reaction Noted   Tape Other (See Comments) 06/22/2012    Family History  Problem Relation Age of Onset   Hypertension Mother    Pulmonary disease Father    Cirrhosis Maternal Grandfather        unknown etiology, non-alcoholic   Liver cancer Maternal Grandfather    Cirrhosis Cousin        unknown etiology, non-alcoholic   Liver cancer Cousin    Cirrhosis Cousin        alcoholic   Colon cancer Neg Hx     Social History   Socioeconomic History   Marital status: Married    Spouse name: Not on file   Number of children: Not on file   Years of education: Not on file   Highest education level: Not on file  Occupational History   Not on  file  Tobacco Use   Smoking status: Former    Packs/day: 2.00    Years: 26.00    Total pack years: 52.00    Types: Cigarettes    Quit date: 04/04/1999    Years since quitting: 22.8   Smokeless tobacco: Former    Quit date: 05/26/1998  Vaping Use   Vaping Use: Never used  Substance and Sexual Activity   Alcohol use: No    Alcohol/week: 0.0 standard drinks of alcohol   Drug use: No   Sexual activity: Not on file  Other Topics Concern   Not on file  Social History Narrative   Not on file   Social Determinants of Health   Financial Resource Strain: Not on file  Food Insecurity: Not on file  Transportation Needs: Not  on file  Physical Activity: Not on file  Stress: Not on file  Social Connections: Not on file    Review of Systems: Gen: Denies fever, chills, cold or flulike symptoms, presyncope, syncope. CV: Denies chest pain, palpitations. Resp: Denies dyspnea or cough. GI: See HPI Heme: See HPI  Physical Exam: There were no vitals taken for this visit. General:   Alert and oriented. No distress noted. Pleasant and cooperative.  Head:  Normocephalic and atraumatic. Eyes:  Conjuctiva clear without scleral icterus. Heart:  S1, S2 present without murmurs appreciated. Lungs:  Clear to auscultation bilaterally. No wheezes, rales, or rhonchi. No distress.  Abdomen:  +BS, soft, non-tender and non-distended. No rebound or guarding. No HSM or masses noted. Msk:  Symmetrical without gross deformities. Normal posture. Extremities:  Without edema. Neurologic:  Alert and  oriented x4 Psych:  Normal mood and affect.    Assessment:     Plan:  ***   Aliene Altes, PA-C [facility] Gastroenterology 01/19/2022

## 2022-01-19 ENCOUNTER — Encounter: Payer: Self-pay | Admitting: Gastroenterology

## 2022-01-19 ENCOUNTER — Ambulatory Visit: Payer: PPO | Admitting: Gastroenterology

## 2022-01-19 VITALS — BP 152/87 | HR 76 | Temp 97.8°F | Ht 67.0 in | Wt 218.0 lb

## 2022-01-19 DIAGNOSIS — R7989 Other specified abnormal findings of blood chemistry: Secondary | ICD-10-CM

## 2022-01-19 DIAGNOSIS — K760 Fatty (change of) liver, not elsewhere classified: Secondary | ICD-10-CM | POA: Diagnosis not present

## 2022-01-19 NOTE — Patient Instructions (Addendum)
Have your blood work completed at [facility].   Instructions for fatty liver: Recommend 1-2# weight loss per week until ideal body weight through exercise & diet. Low fat/cholesterol diet.   Avoid sweets, sodas, fruit juices, sweetened beverages like tea, etc. Gradually increase exercise from 15 min daily up to 1 hr per day 5 days/week.  Continue to avoid alcohol, OTC supplements, and herbal teas.   It was great to see you again!   We will see you back in about 6 months or sooner if needed.    Aliene Altes, PA-C [facility]

## 2022-02-12 ENCOUNTER — Ambulatory Visit: Payer: PPO

## 2022-02-13 ENCOUNTER — Ambulatory Visit
Admission: RE | Admit: 2022-02-13 | Discharge: 2022-02-13 | Disposition: A | Discharge status: Home / Self Care | Payer: PPO | Source: Ambulatory Visit | Attending: Nurse Practitioner | Admitting: Nurse Practitioner

## 2022-02-13 VITALS — BP 174/76 | HR 67 | Temp 98.0°F | Resp 18

## 2022-02-13 DIAGNOSIS — Z1152 Encounter for screening for COVID-19: Secondary | ICD-10-CM | POA: Diagnosis not present

## 2022-02-13 DIAGNOSIS — J309 Allergic rhinitis, unspecified: Secondary | ICD-10-CM | POA: Insufficient documentation

## 2022-02-13 MED ORDER — CETIRIZINE HCL 10 MG PO TABS: 10.0000 mg | ORAL_TABLET | Freq: Every day | ORAL | 0 refills | Status: DC

## 2022-02-13 MED ORDER — FLUTICASONE PROPIONATE 50 MCG/ACT NA SUSP: 2.0000 | Freq: Every day | NASAL | 0 refills | Status: AC

## 2022-02-13 MED ORDER — PSEUDOEPH-BROMPHEN-DM 30-2-10 MG/5ML PO SYRP: 5.0000 mL | ORAL_SOLUTION | Freq: Three times a day (TID) | ORAL | 0 refills | Status: DC | PRN

## 2022-02-13 NOTE — ED Triage Notes (Signed)
Pt reports nasal congestin, ears clogged, dental pain, watery eyes, drainage x 4 days. Advil gives relief with dental pain.

## 2022-02-13 NOTE — Discharge Instructions (Addendum)
COVID test is pending.  You will be contacted if the results of the test are positive.  As discussed, you decline use of antiviral therapy if the test is positive. Take medication as prescribed.  Monitor your blood pressure while you are taking this medication. Increase fluids and allow for plenty of rest. Recommend Tylenol or ibuprofen as needed for pain, fever, or general discomfort. Warm salt water gargles 3-4 times daily to help with throat pain or discomfort. Recommend using a humidifier at bedtime during sleep to help with cough and nasal congestion. Sleep elevated on 2 pillows while symptoms persist. As discussed, if symptoms do not improve over the next 7 to 10 days, or if they suddenly worsen, please follow-up in this clinic or with your primary care physician for further evaluation.Marland Kitchen

## 2022-02-13 NOTE — ED Provider Notes (Signed)
[facility]    CSN: [id number] Arrival date & time: 02/13/22  0944      History   Chief Complaint Chief Complaint  Patient presents with   Appointment    1000   Nasal Congestion         HPI Mark Leonard is a 71 y.o. male.   The history is provided by the patient.   Patient presents for a 3-day history of nasal congestion, sneezing, postnasal drainage, and a mild cough.  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  He denies fever, chills, runny nose, headache, ear pain, wheezing, shortness of breath, difficulty breathing, or GI symptoms.  Patient states he has been taking Advil for his symptoms and using normal saline nasal spray.  Past Medical History:  Diagnosis Date   Acid reflux    Allergy    COVID 04/2019   stuffy head sinus problems sob fatigue x 2 weeks   Diabetes mellitus without complication (HCC)    Elevated LFTs    since 2013   Fatty liver    Gout    Heart murmur    mild no cardiologist for last 20 yrs    History of kidney stones    Hypertension    Mitral regurgitation    mild    Prostate hypertrophy    Venous stasis    legs brown no vein doctor   Wears glasses     Patient Active Problem List   Diagnosis Date Noted   Elevated LFTs 10/13/2021   Diabetes ([place]) 05/27/2013   Esophageal reflux 09/21/2012   Gout 09/21/2012   Fatty liver 09/21/2012   Prostate hypertrophy 09/21/2012   Venous stasis 09/21/2012   Essential hypertension, benign 06/22/2012    Past Surgical History:  Procedure Laterality Date   CARDIOVASCULAR STRESS TEST     CHOLECYSTECTOMY  late 1990's   COLONOSCOPY     COLONOSCOPY N/A 05/17/2019   Surgeon: Daneil Dolin, MD;  seven 4-6 mm polyps removed.  Pathology with tubular adenomas and 1 hyperplastic polyp.  Recommended 3-year repeat.   CYSTOSCOPY WITH LITHOLAPAXY N/A 04/12/2020   Procedure:  CYSTOSCOPY WITH  BLADDER AND KIDNEY LITHOLAPAXY;  Surgeon: Alexis Frock, MD;  Location: [facility];  Service: Urology;  Laterality: N/A;   CYSTOSCOPY WITH RETROGRADE PYELOGRAM, URETEROSCOPY AND STENT PLACEMENT Bilateral 04/12/2020   Procedure: CYSTOSCOPY WITH RETROGRADE PYELOGRAM, URETEROSCOPY AND STENT PLACEMENT;  Surgeon: Alexis Frock, MD;  Location: [facility];  Service: Urology;  Laterality: Bilateral;   EXTRACORPOREAL SHOCK WAVE LITHOTRIPSY  last done 18 yrs ago   4-5 times   HOLMIUM LASER APPLICATION Bilateral 62/69/4854   Procedure: HOLMIUM LASER APPLICATION;  Surgeon: Alexis Frock, MD;  Location: [facility];  Service: Urology;  Laterality: Bilateral;   KIDNEY STONE SURGERY  1970's done x 2   POLYPECTOMY  05/17/2019   Procedure: POLYPECTOMY;  Surgeon: Daneil Dolin, MD;  [facility];  Service: Endoscopy;;   VASECTOMY  late 1990's       Home Medications    Prior to Admission medications   Medication Sig Start Date End Date Taking? Authorizing Provider  brompheniramine-pseudoephedrine-DM 30-2-10 MG/5ML syrup Take 5 mLs by mouth 3 (three) times daily as needed. 02/13/22  Yes Leath-Warren, Alda Lea, NP  cetirizine (ZYRTEC) 10 MG tablet Take 1 tablet (10 mg total) by  mouth daily. 02/13/22  Yes Samaad Hashem-Warren, Alda Lea, NP  fluticasone (FLONASE) 50 MCG/ACT nasal spray Place 2 sprays into both nostrils daily. 02/13/22  Yes Ronny Korff-Warren, Alda Lea, NP  allopurinol (ZYLOPRIM) 300 MG tablet Take 1 tablet (300 mg total) by mouth daily. 07/29/20   Elvia Collum M, DO  blood glucose meter kit and supplies KIT Dispense based on patient and insurance preference. Use daily as directed. (FOR ICD-10 code E11.9) 07/06/17   Mikey Kirschner, MD  chlorzoxazone (PARAFON) 500 MG tablet Take 1 tablet (500 mg total) by mouth 3 (three) times daily as needed. 01/29/20   Lovena Le, Malena M, DO  enalapril (VASOTEC) 20 MG tablet Take 10 mg by  mouth daily.    [provider]  esomeprazole (NEXIUM) 40 MG capsule TAKE 1 CAPSULE DAILY BEFOREBREAKFAST 07/29/20   Lovena Le, Malena M, DO  finasteride (PROSCAR) 5 MG tablet Take 1 tablet (5 mg total) by mouth daily. 07/29/20   Elvia Collum M, DO  glucose blood test strip Use to test blood sugar once daily. Use as instructed. One touch, verio strips. Dx:E11.9 01/29/20   Elvia Collum M, DO  metFORMIN (GLUCOPHAGE XR) 500 MG 24 hr tablet Take 1 tablet (500 mg total) by mouth daily with breakfast. 09/30/20   Elvia Collum M, DO  metroNIDAZOLE (METROCREAM) 0.75 % cream Apply 1 Application topically daily.    [provider]  Multiple Vitamin (MULTIVITAMIN) tablet Take 1 tablet by mouth daily.    [provider]  Multiple Vitamins-Minerals (MULTIVITAMIN MEN 50+ PO) Take 1 tablet by mouth daily.     [provider]    Family History Family History  Problem Relation Age of Onset   Hypertension Mother    Pulmonary disease Father    Cirrhosis Maternal Grandfather        unknown etiology, non-alcoholic   Liver cancer Maternal Grandfather    Cirrhosis Cousin        unknown etiology, non-alcoholic   Liver cancer Cousin    Cirrhosis Cousin        alcoholic   Colon cancer Neg Hx     Social History Social History   Tobacco Use   Smoking status: Former    Packs/day: 2.00    Years: 26.00    Total pack years: 52.00    Types: Cigarettes    Quit date: 04/04/1999    Years since quitting: 22.8   Smokeless tobacco: Former    Quit date: 05/26/1998  Vaping Use   Vaping Use: Never used  Substance Use Topics   Alcohol use: No    Alcohol/week: 0.0 standard drinks of alcohol   Drug use: No     Allergies   Tape   Review of Systems Review of Systems Per HPI  Physical Exam Triage Vital Signs ED Triage Vitals  Enc Vitals Group     BP 02/13/22 1004 (!) 174/76     Pulse Rate 02/13/22 1004 67     Resp 02/13/22 1004 18     Temp 02/13/22 1004 98 F (36.7  C)     Temp Source 02/13/22 1004 Oral     SpO2 02/13/22 1004 95 %     Weight --      Height --      Head Circumference --      Peak Flow --      Pain Score 02/13/22 1003 4     Pain Loc --      Pain Edu? --  Excl. in GC? --    No data found.  Updated Vital Signs BP (!) 174/76 (BP Location: Right Arm)   Pulse 67   Temp 98 F (36.7 C) (Oral)   Resp 18   SpO2 95%   Visual Acuity Right Eye Distance:   Left Eye Distance:   Bilateral Distance:    Right Eye Near:   Left Eye Near:    Bilateral Near:     Physical Exam Vitals and nursing note reviewed.  Constitutional:      General: He is not in acute distress.    Appearance: Normal appearance.  HENT:     Head: Normocephalic.     Right Ear: Tympanic membrane, ear canal and external ear normal.     Left Ear: Tympanic membrane, ear canal and external ear normal.     Nose: Congestion present.     Right Turbinates: Enlarged and swollen.     Left Turbinates: Enlarged and swollen.     Right Sinus: No maxillary sinus tenderness or frontal sinus tenderness.     Left Sinus: No maxillary sinus tenderness or frontal sinus tenderness.     Mouth/Throat:     Lips: Pink.     Mouth: Mucous membranes are moist.     Pharynx: Uvula midline. Posterior oropharyngeal erythema present. No pharyngeal swelling, oropharyngeal exudate or uvula swelling.     Tonsils: No tonsillar exudate.  Eyes:     Extraocular Movements: Extraocular movements intact.     Conjunctiva/sclera: Conjunctivae normal.     Pupils: Pupils are equal, round, and reactive to light.  Cardiovascular:     Rate and Rhythm: Normal rate and regular rhythm.     Pulses: Normal pulses.     Heart sounds: Normal heart sounds.  Pulmonary:     Effort: No respiratory distress.     Breath sounds: No stridor. No wheezing, rhonchi or rales.  Abdominal:     General: Bowel sounds are normal.     Palpations: Abdomen is soft.     Tenderness: There is no abdominal tenderness.   Musculoskeletal:     Cervical back: Normal range of motion.  Lymphadenopathy:     Cervical: No cervical adenopathy.  Skin:    General: Skin is warm and dry.  Neurological:     General: No focal deficit present.     Mental Status: He is alert and oriented to person, place, and time.  Psychiatric:        Mood and Affect: Mood normal.        Behavior: Behavior normal.      UC Treatments / Results  Labs (all labs ordered are listed, but only abnormal results are displayed) Labs Reviewed  SARS CORONAVIRUS 2 (TAT 6-24 HRS)    EKG   Radiology No results found.  Procedures Procedures (including critical care time)  Medications Ordered in UC Medications - No data to display  Initial Impression / Assessment and Plan / UC Course  I have reviewed the triage vital signs and the nursing notes.  Pertinent labs & imaging results that were available during my care of the patient were reviewed by me and considered in my medical decision making (see chart for details).  Patient is well-appearing, he is in no acute distress.  He is hypertensive, but vital signs are otherwise stable.  Symptoms have been present for the past 2 to 3 days, consistent with allergic rhinitis in the presence of nasal congestion, postnasal drainage, and sneezing.  COVID test is pending.  Patient declines use of antiviral therapy at this time.  Allergic rhinitis  Take medication as prescribed.  Please monitor blood pressure while taking medications. Increase fluids and allow for plenty of rest. May take Tylenol as needed for pain or discomfort. Continue normal saline nasal spray to help with nasal congestion. Discussed symptoms with patient and course of symptoms if they fail to improve.  Patient was advised to follow-up in this clinic or with his primary care physician. COVID test is pending.  Patient declines use of antiviral therapy.  Patient verbalizes understanding.  All questions are answered, patient is  stable for discharge. Final Clinical Impressions(s) / UC Diagnoses   Final diagnoses:  Allergic rhinitis, unspecified seasonality, unspecified trigger     Discharge Instructions      COVID test is pending.  You will be contacted if the results of the test are positive.  As discussed, you decline use of antiviral therapy if the test is positive. Take medication as prescribed.  Monitor your blood pressure while you are taking this medication. Increase fluids and allow for plenty of rest. Recommend Tylenol or ibuprofen as needed for pain, fever, or general discomfort. Warm salt water gargles 3-4 times daily to help with throat pain or discomfort. Recommend using a humidifier at bedtime during sleep to help with cough and nasal congestion. Sleep elevated on 2 pillows while symptoms persist. As discussed, if symptoms do not improve over the next 7 to 10 days, or if they suddenly worsen, please follow-up in this clinic or with your primary care physician for further evaluation..       ED Prescriptions     Medication Sig Dispense Auth. Provider   fluticasone (FLONASE) 50 MCG/ACT nasal spray Place 2 sprays into both nostrils daily. 16 g Mystique Bjelland-Warren, Alda Lea, NP   cetirizine (ZYRTEC) 10 MG tablet Take 1 tablet (10 mg total) by mouth daily. 30 tablet Rhylee Nunn-Warren, Alda Lea, NP   brompheniramine-pseudoephedrine-DM 30-2-10 MG/5ML syrup Take 5 mLs by mouth 3 (three) times daily as needed. 120 mL Ireene Ballowe-Warren, Alda Lea, NP      PDMP not reviewed this encounter.   Tish Men, NP 02/13/22 [phone]

## 2022-02-14 LAB — SARS CORONAVIRUS 2 (TAT 6-24 HRS): SARS Coronavirus 2: NEGATIVE

## 2022-04-16 DIAGNOSIS — G8929 Other chronic pain: Secondary | ICD-10-CM | POA: Diagnosis not present

## 2022-04-16 DIAGNOSIS — M25551 Pain in right hip: Secondary | ICD-10-CM | POA: Diagnosis not present

## 2022-04-27 ENCOUNTER — Encounter: Payer: Self-pay | Admitting: *Deleted

## 2022-04-30 DIAGNOSIS — I10 Essential (primary) hypertension: Secondary | ICD-10-CM | POA: Diagnosis not present

## 2022-04-30 DIAGNOSIS — M109 Gout, unspecified: Secondary | ICD-10-CM | POA: Diagnosis not present

## 2022-04-30 DIAGNOSIS — E875 Hyperkalemia: Secondary | ICD-10-CM | POA: Diagnosis not present

## 2022-04-30 DIAGNOSIS — Z6834 Body mass index (BMI) 34.0-34.9, adult: Secondary | ICD-10-CM | POA: Diagnosis not present

## 2022-04-30 DIAGNOSIS — K219 Gastro-esophageal reflux disease without esophagitis: Secondary | ICD-10-CM | POA: Diagnosis not present

## 2022-04-30 DIAGNOSIS — N400 Enlarged prostate without lower urinary tract symptoms: Secondary | ICD-10-CM | POA: Diagnosis not present

## 2022-04-30 DIAGNOSIS — E119 Type 2 diabetes mellitus without complications: Secondary | ICD-10-CM | POA: Diagnosis not present

## 2022-04-30 DIAGNOSIS — L719 Rosacea, unspecified: Secondary | ICD-10-CM | POA: Diagnosis not present

## 2022-04-30 DIAGNOSIS — I951 Orthostatic hypotension: Secondary | ICD-10-CM | POA: Diagnosis not present

## 2022-04-30 DIAGNOSIS — K760 Fatty (change of) liver, not elsewhere classified: Secondary | ICD-10-CM | POA: Diagnosis not present

## 2022-05-15 ENCOUNTER — Telehealth: Payer: Self-pay

## 2022-05-15 DIAGNOSIS — E875 Hyperkalemia: Secondary | ICD-10-CM | POA: Diagnosis not present

## 2022-05-15 DIAGNOSIS — R7989 Other specified abnormal findings of blood chemistry: Secondary | ICD-10-CM | POA: Diagnosis not present

## 2022-05-15 NOTE — Telephone Encounter (Signed)
Procedure: COLONOSCOPY  Height: 5' 7"$  Weight: 213 LB        Have you had a colonoscopy before?  YES  Do you have family history of colon cancer?  NO  Do you have a family history of polyps? YES  Previous colonoscopy with polyps removed? YES  Do you have a history colorectal cancer?   NO  Are you diabetic?  YES, TYPE 2  Do you have a prosthetic or mechanical heart valve? NO  Do you have a pacemaker/defibrillator?   NO  Have you had endocarditis/atrial fibrillation?  NO  Do you use supplemental oxygen/CPAP?  NO  Have you had joint replacement within the last 12 months?  NO  Do you tend to be constipated or have to use laxatives?  NO   Do you have history of alcohol use? If yes, how much and how often.  NO  Do you have history or are you using drugs? If yes, what do are you  using?  NO  Have you ever had a stroke/heart attack?  NO  Have you ever had a heart or other vascular stent placed? NO  Do you take weight loss medication? NO  male patients,: have you had a hysterectomy? N/A                              are you post menopausal?  N/A                              do you still have your menstrual cycle? N/A    Date of last menstrual period? N/A  Do you take any blood-thinning medications such as: (Plavix, aspirin, Coumadin, Aggrenox, Brilinta, Xarelto, Eliquis, Pradaxa, Savaysa or Effient)? NO  If yes we need the name, milligram, dosage and who is prescribing doctor:  N/A             Current Outpatient Medications  Medication Sig Dispense Refill   allopurinol (ZYLOPRIM) 300 MG tablet Take 1 tablet (300 mg total) by mouth daily. 90 tablet 1   blood glucose meter kit and supplies KIT Dispense based on patient and insurance preference. Use daily as directed. (FOR ICD-10 code E11.9) 1 each 5   enalapril (VASOTEC) 20 MG tablet Take 10 mg by mouth daily.     esomeprazole (NEXIUM) 40 MG capsule TAKE 1 CAPSULE DAILY BEFOREBREAKFAST 90 capsule 1   finasteride (PROSCAR)  5 MG tablet Take 1 tablet (5 mg total) by mouth daily. 90 tablet 1   glucose blood test strip Use to test blood sugar once daily. Use as instructed. One touch, verio strips. Dx:E11.9 100 each 12   metFORMIN (GLUCOPHAGE XR) 500 MG 24 hr tablet Take 1 tablet (500 mg total) by mouth daily with breakfast. 90 tablet 1   brompheniramine-pseudoephedrine-DM 30-2-10 MG/5ML syrup Take 5 mLs by mouth 3 (three) times daily as needed. (Patient not taking: Reported on 05/15/2022) 120 mL 0   cetirizine (ZYRTEC) 10 MG tablet Take 1 tablet (10 mg total) by mouth daily. (Patient not taking: Reported on 05/15/2022) 30 tablet 0   chlorzoxazone (PARAFON) 500 MG tablet Take 1 tablet (500 mg total) by mouth 3 (three) times daily as needed. (Patient not taking: Reported on 05/15/2022) 30 tablet 0   fluticasone (FLONASE) 50 MCG/ACT nasal spray Place 2 sprays into both nostrils daily. (Patient not taking: Reported on 05/15/2022) 16 g 0  metroNIDAZOLE (METROCREAM) 0.75 % cream Apply 1 Application topically daily. (Patient not taking: Reported on 05/15/2022)     Multiple Vitamin (MULTIVITAMIN) tablet Take 1 tablet by mouth daily. (Patient not taking: Reported on 05/15/2022)     Multiple Vitamins-Minerals (MULTIVITAMIN MEN 50+ PO) Take 1 tablet by mouth daily.  (Patient not taking: Reported on 05/15/2022)     No current facility-administered medications for this visit.    Allergies  Allergen Reactions   Tape Other (See Comments)    Adhesive Lehman Prom

## 2022-05-26 NOTE — Telephone Encounter (Signed)
Multiple polyps in 2021, due for 3 year surveillance.   ASA 2. No metformin day of procedure.

## 2022-05-27 MED ORDER — PEG 3350-KCL-NA BICARB-NACL 420 G PO SOLR: 4000.0000 mL | Freq: Once | ORAL | 0 refills | Status: AC

## 2022-05-27 NOTE — Addendum Note (Signed)
Addended by: Cheron Every on: 05/27/2022 11:26 AM   Modules accepted: Orders

## 2022-05-27 NOTE — Telephone Encounter (Signed)
Spoke with pt. He has been scheduled with Dr. Gala Romney 3/18 at 1045am. Aware will send instructions. Rx for prep to pharmacy

## 2022-05-28 NOTE — Telephone Encounter (Signed)
Questionnaire from recall,no referral needed 

## 2022-05-29 DIAGNOSIS — Z6834 Body mass index (BMI) 34.0-34.9, adult: Secondary | ICD-10-CM | POA: Diagnosis not present

## 2022-05-29 DIAGNOSIS — R030 Elevated blood-pressure reading, without diagnosis of hypertension: Secondary | ICD-10-CM | POA: Diagnosis not present

## 2022-05-29 DIAGNOSIS — N390 Urinary tract infection, site not specified: Secondary | ICD-10-CM | POA: Diagnosis not present

## 2022-06-08 ENCOUNTER — Observation Stay (HOSPITAL_COMMUNITY)
Admission: EM | Admit: 2022-06-08 | Discharge: 2022-06-09 | Disposition: A | Discharge status: Home / Self Care | Payer: PPO | Attending: Family Medicine | Admitting: Family Medicine

## 2022-06-08 ENCOUNTER — Encounter (HOSPITAL_COMMUNITY): Payer: Self-pay

## 2022-06-08 ENCOUNTER — Other Ambulatory Visit: Payer: Self-pay

## 2022-06-08 DIAGNOSIS — R109 Unspecified abdominal pain: Secondary | ICD-10-CM | POA: Diagnosis not present

## 2022-06-08 DIAGNOSIS — N201 Calculus of ureter: Secondary | ICD-10-CM | POA: Diagnosis not present

## 2022-06-08 DIAGNOSIS — Z7952 Long term (current) use of systemic steroids: Secondary | ICD-10-CM | POA: Diagnosis not present

## 2022-06-08 DIAGNOSIS — N202 Calculus of kidney with calculus of ureter: Secondary | ICD-10-CM | POA: Diagnosis not present

## 2022-06-08 DIAGNOSIS — N200 Calculus of kidney: Secondary | ICD-10-CM

## 2022-06-08 DIAGNOSIS — N3001 Acute cystitis with hematuria: Secondary | ICD-10-CM | POA: Diagnosis not present

## 2022-06-08 DIAGNOSIS — Z87891 Personal history of nicotine dependence: Secondary | ICD-10-CM | POA: Diagnosis not present

## 2022-06-08 DIAGNOSIS — N390 Urinary tract infection, site not specified: Secondary | ICD-10-CM | POA: Diagnosis not present

## 2022-06-08 DIAGNOSIS — Z79899 Other long term (current) drug therapy: Secondary | ICD-10-CM | POA: Diagnosis not present

## 2022-06-08 DIAGNOSIS — Z8616 Personal history of COVID-19: Secondary | ICD-10-CM | POA: Insufficient documentation

## 2022-06-08 DIAGNOSIS — Z1152 Encounter for screening for COVID-19: Secondary | ICD-10-CM | POA: Diagnosis not present

## 2022-06-08 DIAGNOSIS — E119 Type 2 diabetes mellitus without complications: Secondary | ICD-10-CM

## 2022-06-08 DIAGNOSIS — I10 Essential (primary) hypertension: Secondary | ICD-10-CM | POA: Diagnosis not present

## 2022-06-08 DIAGNOSIS — R1110 Vomiting, unspecified: Secondary | ICD-10-CM | POA: Insufficient documentation

## 2022-06-08 DIAGNOSIS — R300 Dysuria: Secondary | ICD-10-CM | POA: Diagnosis not present

## 2022-06-08 DIAGNOSIS — N179 Acute kidney failure, unspecified: Secondary | ICD-10-CM

## 2022-06-08 DIAGNOSIS — R030 Elevated blood-pressure reading, without diagnosis of hypertension: Secondary | ICD-10-CM | POA: Diagnosis not present

## 2022-06-08 DIAGNOSIS — R6883 Chills (without fever): Secondary | ICD-10-CM | POA: Diagnosis not present

## 2022-06-08 DIAGNOSIS — K760 Fatty (change of) liver, not elsewhere classified: Secondary | ICD-10-CM | POA: Diagnosis not present

## 2022-06-08 DIAGNOSIS — Z7984 Long term (current) use of oral hypoglycemic drugs: Secondary | ICD-10-CM | POA: Insufficient documentation

## 2022-06-08 DIAGNOSIS — Z6834 Body mass index (BMI) 34.0-34.9, adult: Secondary | ICD-10-CM | POA: Diagnosis not present

## 2022-06-08 DIAGNOSIS — R319 Hematuria, unspecified: Secondary | ICD-10-CM | POA: Diagnosis not present

## 2022-06-08 LAB — COMPREHENSIVE METABOLIC PANEL
ALT: 34 U/L (ref 0–44)
AST: 46 U/L — ABNORMAL HIGH (ref 15–41)
Albumin: 3.6 g/dL (ref 3.5–5.0)
Alkaline Phosphatase: 131 U/L — ABNORMAL HIGH (ref 38–126)
Anion gap: 9 (ref 5–15)
BUN: 19 mg/dL (ref 8–23)
CO2: 25 mmol/L (ref 22–32)
Calcium: 8.7 mg/dL — ABNORMAL LOW (ref 8.9–10.3)
Chloride: 103 mmol/L (ref 98–111)
Creatinine, Ser: 1.46 mg/dL — ABNORMAL HIGH (ref 0.61–1.24)
GFR, Estimated: 51 mL/min — ABNORMAL LOW (ref >60)
Glucose, Bld: 108 mg/dL — ABNORMAL HIGH (ref 70–99)
Potassium: 4.3 mmol/L (ref 3.5–5.1)
Sodium: 137 mmol/L (ref 135–145)
Total Bilirubin: 0.4 mg/dL (ref 0.3–1.2)
Total Protein: 8.3 g/dL — ABNORMAL HIGH (ref 6.5–8.1)

## 2022-06-08 LAB — CBC WITH DIFFERENTIAL/PLATELET
Abs Immature Granulocytes: 0.06 10*3/uL (ref 0.00–0.07)
Basophils Absolute: 0.1 10*3/uL (ref 0.0–0.1)
Basophils Relative: 0 %
Eosinophils Absolute: 0.1 10*3/uL (ref 0.0–0.5)
Eosinophils Relative: 1 %
HCT: 43.6 % (ref 39.0–52.0)
Hemoglobin: 14.1 g/dL (ref 13.0–17.0)
Immature Granulocytes: 0 %
Lymphocytes Relative: 13 %
Lymphs Abs: 2 10*3/uL (ref 0.7–4.0)
MCH: 32.8 pg (ref 26.0–34.0)
MCHC: 32.3 g/dL (ref 30.0–36.0)
MCV: 101.4 fL — ABNORMAL HIGH (ref 80.0–100.0)
Monocytes Absolute: 0.7 10*3/uL (ref 0.1–1.0)
Monocytes Relative: 5 %
Neutro Abs: 12.1 10*3/uL — ABNORMAL HIGH (ref 1.7–7.7)
Neutrophils Relative %: 81 %
Platelets: 205 10*3/uL (ref 150–400)
RBC: 4.3 MIL/uL (ref 4.22–5.81)
RDW: 15.7 % — ABNORMAL HIGH (ref 11.5–15.5)
WBC: 15 10*3/uL — ABNORMAL HIGH (ref 4.0–10.5)
nRBC: 0 % (ref 0.0–0.2)

## 2022-06-08 LAB — URINALYSIS, ROUTINE W REFLEX MICROSCOPIC
Bilirubin Urine: NEGATIVE
Glucose, UA: NEGATIVE mg/dL
Ketones, ur: NEGATIVE mg/dL
Nitrite: NEGATIVE
Protein, ur: 100 mg/dL — AB
RBC / HPF: >50 RBC/hpf (ref 0–5)
Specific Gravity, Urine: 1.012 (ref 1.005–1.030)
WBC, UA: >50 WBC/hpf (ref 0–5)
pH: 5 (ref 5.0–8.0)

## 2022-06-08 LAB — RESP PANEL BY RT-PCR (RSV, FLU A&B, COVID)  RVPGX2
Influenza A by PCR: NEGATIVE
Influenza B by PCR: NEGATIVE
Resp Syncytial Virus by PCR: NEGATIVE
SARS Coronavirus 2 by RT PCR: NEGATIVE

## 2022-06-08 LAB — GLUCOSE, CAPILLARY: Glucose-Capillary: 162 mg/dL — ABNORMAL HIGH (ref 70–99)

## 2022-06-08 LAB — LACTIC ACID, PLASMA
Lactic Acid, Venous: 3.5 mmol/L (ref 0.5–1.9)
Lactic Acid, Venous: 1.2 mmol/L (ref 0.5–1.9)

## 2022-06-08 MED ORDER — LACTATED RINGERS IV SOLN: INTRAVENOUS | Status: DC

## 2022-06-08 MED ORDER — FLUTICASONE PROPIONATE 50 MCG/ACT NA SUSP: 2.0000 | Freq: Every day | NASAL | Status: DC | PRN

## 2022-06-08 MED ORDER — SODIUM CHLORIDE 0.9 % IV SOLN
2.0000 g | Freq: Once | INTRAVENOUS | Status: AC
  Administered 2022-06-08: 2 g via INTRAVENOUS
  Filled 2022-06-08: qty 20

## 2022-06-08 MED ORDER — LABETALOL HCL 5 MG/ML IV SOLN: 5.0000 mg | INTRAVENOUS | Status: DC | PRN

## 2022-06-08 MED ORDER — ONDANSETRON HCL 4 MG/2ML IJ SOLN: 4.0000 mg | Freq: Four times a day (QID) | INTRAMUSCULAR | Status: DC | PRN

## 2022-06-08 MED ORDER — ACETAMINOPHEN 650 MG RE SUPP: 650.0000 mg | Freq: Four times a day (QID) | RECTAL | Status: DC | PRN

## 2022-06-08 MED ORDER — SODIUM CHLORIDE 0.9 % IV BOLUS
1000.0000 mL | Freq: Once | INTRAVENOUS | Status: AC
  Administered 2022-06-08: 1000 mL via INTRAVENOUS

## 2022-06-08 MED ORDER — ONDANSETRON HCL 4 MG PO TABS: 4.0000 mg | ORAL_TABLET | Freq: Four times a day (QID) | ORAL | Status: DC | PRN

## 2022-06-08 MED ORDER — FINASTERIDE 5 MG PO TABS
5.0000 mg | ORAL_TABLET | Freq: Every day | ORAL | Status: DC
  Administered 2022-06-09: 5 mg via ORAL
  Filled 2022-06-08: qty 1

## 2022-06-08 MED ORDER — PANTOPRAZOLE SODIUM 40 MG PO TBEC
80.0000 mg | DELAYED_RELEASE_TABLET | Freq: Every day | ORAL | Status: DC
  Administered 2022-06-09: 80 mg via ORAL
  Filled 2022-06-08: qty 2

## 2022-06-08 MED ORDER — ENOXAPARIN SODIUM 40 MG/0.4ML IJ SOSY
40.0000 mg | PREFILLED_SYRINGE | INTRAMUSCULAR | Status: DC
  Administered 2022-06-09: 40 mg via SUBCUTANEOUS
  Filled 2022-06-08: qty 0.4

## 2022-06-08 MED ORDER — ACETAMINOPHEN 325 MG PO TABS
650.0000 mg | ORAL_TABLET | Freq: Four times a day (QID) | ORAL | Status: DC | PRN
  Administered 2022-06-08: 650 mg via ORAL
  Filled 2022-06-08: qty 2

## 2022-06-08 MED ORDER — INSULIN ASPART 100 UNIT/ML IJ SOLN
0.0000 [IU] | INTRAMUSCULAR | Status: DC
  Administered 2022-06-09: 1 [IU] via SUBCUTANEOUS
  Administered 2022-06-09: 2 [IU] via SUBCUTANEOUS
  Administered 2022-06-09: 1 [IU] via SUBCUTANEOUS
  Filled 2022-06-08: qty 0.09

## 2022-06-08 MED ORDER — SODIUM CHLORIDE 0.9 % IV SOLN: 2.0000 g | INTRAVENOUS | Status: DC

## 2022-06-08 NOTE — Assessment & Plan Note (Signed)
Pt with mild AKI today, creat of 1.4 up from a 1.1 baseline. IVF Hold ACEi Repeat BMP in AM Further work up if this worsens

## 2022-06-08 NOTE — Assessment & Plan Note (Signed)
Hold ACEi in setting of AKI PRN labetalol IV for now

## 2022-06-08 NOTE — ED Triage Notes (Signed)
Pt sent from PCP for possible septic kidney stone. L sided flank pain.

## 2022-06-08 NOTE — Assessment & Plan Note (Addendum)
UTI complicated by presence of L ureteral stone.  With L flank pain but WITHOUT hydronephrosis according to CT scan. 1/4 SIRS (WBC). EDP reportedly discussed with Dr. Gilford Rile, EDP tells me that Dr. Gilford Rile felt that: Doesn't need ureteral stent Patient can be discharged home with ABx treatment and office follow up. EDP concerned given the flank pain, N/V, presence of stone, requests admission for obs and IV ABx Rocephin UCx pending Repeat CBC in AM Trend lactates 1L IVF bolus in ED, will put on 125 cc/hr for tonight (though despite initial lactate of 3.5 his BP remains HIGH). Clear liquid diet for now. Call urology back if patient worsens at all.

## 2022-06-08 NOTE — Assessment & Plan Note (Signed)
Hold metformin Trend lactate SSI, sensitive scale Q4H

## 2022-06-08 NOTE — ED Provider Notes (Signed)
[place] [facility] Provider Note   CSN: [id number] Arrival date & time: 06/08/22  1904     History {Add pertinent medical, surgical, social history, OB history to HPI:1} Chief Complaint  Patient presents with   Flank Pain    Mark Leonard is a 72 y.o. male.  Patient complains of chills for couple days and he has vomited a couple times and he has left flank pain.  He went to his family doctor today who arranged for him to get a CT scan that shows a 5 mm stone in the left proximal ureter.  Patient having mild pain now.  Patient has a history of diabetes   Flank Pain       Home Medications Prior to Admission medications   Medication Sig Start Date End Date Taking? Authorizing Provider  allopurinol (ZYLOPRIM) 300 MG tablet Take 1 tablet (300 mg total) by mouth daily. 07/29/20   Elvia Collum M, DO  blood glucose meter kit and supplies KIT Dispense based on patient and insurance preference. Use daily as directed. (FOR ICD-10 code E11.9) 07/06/17   Mikey Kirschner, MD  brompheniramine-pseudoephedrine-DM 30-2-10 MG/5ML syrup Take 5 mLs by mouth 3 (three) times daily as needed. Patient not taking: Reported on 05/15/2022 02/13/22   Leath-Warren, Alda Lea, NP  cetirizine (ZYRTEC) 10 MG tablet Take 1 tablet (10 mg total) by mouth daily. Patient not taking: Reported on 05/15/2022 02/13/22   Leath-Warren, Alda Lea, NP  chlorzoxazone (PARAFON) 500 MG tablet Take 1 tablet (500 mg total) by mouth 3 (three) times daily as needed. Patient not taking: Reported on 05/15/2022 01/29/20   Elvia Collum M, DO  enalapril (VASOTEC) 20 MG tablet Take 10 mg by mouth daily.    [provider]  esomeprazole (NEXIUM) 40 MG capsule TAKE 1 CAPSULE DAILY BEFOREBREAKFAST 07/29/20   Lovena Le, Malena M, DO  finasteride (PROSCAR) 5 MG tablet Take 1 tablet (5 mg total) by mouth daily. 07/29/20   Lovena Le, Malena M, DO  fluticasone (FLONASE) 50 MCG/ACT nasal spray Place 2  sprays into both nostrils daily. Patient not taking: Reported on 05/15/2022 02/13/22   Leath-Warren, Alda Lea, NP  glucose blood test strip Use to test blood sugar once daily. Use as instructed. One touch, verio strips. Dx:E11.9 01/29/20   Elvia Collum M, DO  metFORMIN (GLUCOPHAGE XR) 500 MG 24 hr tablet Take 1 tablet (500 mg total) by mouth daily with breakfast. 09/30/20   Elvia Collum M, DO  metroNIDAZOLE (METROCREAM) 0.75 % cream Apply 1 Application topically daily. Patient not taking: Reported on 05/15/2022    [provider]  Multiple Vitamin (MULTIVITAMIN) tablet Take 1 tablet by mouth daily. Patient not taking: Reported on 05/15/2022    [provider]  Multiple Vitamins-Minerals (MULTIVITAMIN MEN 50+ PO) Take 1 tablet by mouth daily.  Patient not taking: Reported on 05/15/2022    [provider]      Allergies    Tape    Review of Systems   Review of Systems  Genitourinary:  Positive for flank pain.    Physical Exam Updated Vital Signs BP (!) 177/89   Pulse 90   Temp 98.2 F (36.8 C) (Oral)   Resp 17   Ht '5\' 7"'$  (1.702 m)   Wt 98.9 kg   SpO2 98%   BMI 34.14 kg/m  Physical Exam  ED Results / Procedures / Treatments   Labs (all labs ordered are listed, but only abnormal results are displayed)  Labs Reviewed  CBC WITH DIFFERENTIAL/PLATELET - Abnormal; Notable for the following components:      Result Value   WBC 15.0 (*)    MCV 101.4 (*)    RDW 15.7 (*)    Neutro Abs 12.1 (*)    All other components within normal limits  COMPREHENSIVE METABOLIC PANEL - Abnormal; Notable for the following components:   Glucose, Bld 108 (*)    Creatinine, Ser 1.46 (*)    Calcium 8.7 (*)    Total Protein 8.3 (*)    AST 46 (*)    Alkaline Phosphatase 131 (*)    GFR, Estimated 51 (*)    All other components within normal limits  URINALYSIS, ROUTINE W REFLEX MICROSCOPIC - Abnormal; Notable for the following components:   APPearance HAZY (*)    Hgb urine  dipstick MODERATE (*)    Protein, ur 100 (*)    Leukocytes,Ua LARGE (*)    Bacteria, UA RARE (*)    All other components within normal limits  LACTIC ACID, PLASMA - Abnormal; Notable for the following components:   Lactic Acid, Venous 3.5 (*)    All other components within normal limits  CULTURE, BLOOD (ROUTINE X 2)  CULTURE, BLOOD (ROUTINE X 2)  RESP PANEL BY RT-PCR (RSV, FLU A&B, COVID)  [id number]  LACTIC ACID, PLASMA    EKG None  Radiology No results found.  Procedures Procedures  {Document cardiac monitor, telemetry assessment procedure when appropriate:1}  Medications Ordered in ED Medications  sodium chloride 0.9 % bolus 1,000 mL (1,000 mLs Intravenous New Bag/Given 06/08/22 2053)  cefTRIAXone (ROCEPHIN) 2 g in sodium chloride 0.9 % 100 mL IVPB (2 g Intravenous New Bag/Given 06/08/22 2052)    ED Course/ Medical Decision Making/ A&P  Patient with probable urinary tract infection with a kidney stone.  He has an elevated white count and a lactic of 3.5.  His urine will be cultured and he will be hydrated and given IV antibiotics.  I spoke with urology and they felt the patient could be discharged home with close follow-up but the patient felt uncomfortable going home today.  He will be admitted to medicine and possibly have urology consult tomorrow {   Click here for ABCD2, HEART and other calculatorsREFRESH Note before signing :1}                          Medical Decision Making Risk Decision regarding hospitalization.   Patient with a kidney stone and urinary tract infection.  He has started on antibiotics and IV fluids will be admitted to medicine  {Document critical care time when appropriate:1} {Document review of labs and clinical decision tools ie heart score, Chads2Vasc2 etc:1}  {Document your independent review of radiology images, and any outside records:1} {Document your discussion with family members, caretakers, and with consultants:1} {Document social  determinants of health affecting pt's care:1} {Document your decision making why or why not admission, treatments were needed:1} Final Clinical Impression(s) / ED Diagnoses Final diagnoses:  Acute cystitis with hematuria  Kidney stone    Rx / DC Orders ED Discharge Orders     None

## 2022-06-08 NOTE — ED Provider Triage Note (Signed)
Emergency Medicine Provider Triage Evaluation Note  TAMI TARDO , a 72 y.o. male  was evaluated in triage.  Pt sent in by his Md due to a kidney staone and a uti.  Primary worried about sepsis.   Review of Systems  Positive: ferer Negative: Lower abdominal pain   Physical Exam  BP (!) 177/89   Pulse 90   Temp 98.7 F (37.1 C) (Oral)   Resp 17   Ht '5\' 7"'$  (1.702 m)   Wt 98.9 kg   SpO2 98%   BMI 34.14 kg/m  Gen:   Awake, no distress  Resp:  Normal effort  MSK:   Moves extremities without difficulty  Other:    Medical Decision Making  Medically screening exam initiated at 7:28 PM.  Appropriate orders placed.  NEREO SHOBERT was informed that the remainder of the evaluation will be completed by another provider, this initial triage assessment does not replace that evaluation, and the importance of remaining in the ED until their evaluation is complete.     Fransico Meadow, [place] 06/08/22 1929

## 2022-06-08 NOTE — H&P (Signed)
History and Physical    Patient: Mark Leonard [id number] DOB: 1950-04-14 DOA: 06/08/2022 DOS: the patient was seen and examined on 06/08/2022 PCP: [facility]  Patient coming from: Home  Chief Complaint:  Chief Complaint  Patient presents with   Flank Pain   HPI: Mark Leonard is a 72 y.o. male with medical history significant of DM2, HTN, prior kidney stones.  Pt in to ED with L flank pain, chills, vomiting for a couple of days now.  Went to PCP who got CT scan that shows 30m stone in proximal L ureter.  Pt sent in to ED.    Review of Systems: As mentioned in the history of present illness. All other systems reviewed and are negative. Past Medical History:  Diagnosis Date   Acid reflux    Allergy    COVID 04/2019   stuffy head sinus problems sob fatigue x 2 weeks   Diabetes mellitus without complication (HCC)    Elevated LFTs    since 2013   Fatty liver    Gout    Heart murmur    mild no cardiologist for last 20 yrs    History of kidney stones    Hypertension    Mitral regurgitation    mild    Prostate hypertrophy    Venous stasis    legs brown no vein doctor   Wears glasses    Past Surgical History:  Procedure Laterality Date   CARDIOVASCULAR STRESS TEST     CHOLECYSTECTOMY  late 1990's   COLONOSCOPY     COLONOSCOPY N/A 05/17/2019   Surgeon: RDaneil Dolin MD;  seven 4-6 mm polyps removed.  Pathology with tubular adenomas and 1 hyperplastic polyp.  Recommended 3-year repeat.   CYSTOSCOPY WITH LITHOLAPAXY N/A 04/12/2020   Procedure: CYSTOSCOPY WITH  BLADDER AND KIDNEY LITHOLAPAXY;  Surgeon: MAlexis Frock MD;  [facility]  Service: Urology;  Laterality: N/A;   CYSTOSCOPY WITH RETROGRADE PYELOGRAM, URETEROSCOPY AND STENT PLACEMENT Bilateral 04/12/2020   Procedure: CYSTOSCOPY WITH RETROGRADE PYELOGRAM, URETEROSCOPY AND STENT PLACEMENT;  Surgeon: MAlexis Frock MD;  [facility]  Service:  Urology;  Laterality: Bilateral;   EXTRACORPOREAL SHOCK WAVE LITHOTRIPSY  last done 18 yrs ago   4-5 times   HOLMIUM LASER APPLICATION Bilateral [id number]  Procedure: HOLMIUM LASER APPLICATION;  Surgeon: MAlexis Frock MD;  [facility]  Service: Urology;  Laterality: Bilateral;   KIDNEY STONE SURGERY  1970's done x 2   POLYPECTOMY  05/17/2019   Procedure: POLYPECTOMY;  Surgeon: RDaneil Dolin MD;  [facility];  Service: Endoscopy;;   VASECTOMY  late 1990's   Social History:  reports that he quit smoking about 23 years ago. His smoking use included cigarettes. He has a 52.00 pack-year smoking history. He quit smokeless tobacco use about 24 years ago. He reports that he does not drink alcohol and does not use drugs.  Allergies  Allergen Reactions   Tape Other (See Comments)    Adhesive /Lehman Prom   Family History  Problem Relation Age of Onset   Hypertension Mother    Pulmonary disease Father    Cirrhosis Maternal Grandfather        unknown etiology, non-alcoholic   Liver cancer Maternal Grandfather    Cirrhosis Cousin        unknown etiology, non-alcoholic   Liver cancer Cousin    Cirrhosis Cousin        alcoholic  Colon cancer Neg Hx     Prior to Admission medications   Medication Sig Start Date End Date Taking? Authorizing Provider  allopurinol (ZYLOPRIM) 300 MG tablet Take 1 tablet (300 mg total) by mouth daily. 07/29/20   Elvia Collum M, DO  blood glucose meter kit and supplies KIT Dispense based on patient and insurance preference. Use daily as directed. (FOR ICD-10 code E11.9) 07/06/17   Mikey Kirschner, MD  brompheniramine-pseudoephedrine-DM 30-2-10 MG/5ML syrup Take 5 mLs by mouth 3 (three) times daily as needed. Patient not taking: Reported on 05/15/2022 02/13/22   Leath-Warren, Alda Lea, NP  cetirizine (ZYRTEC) 10 MG tablet Take 1 tablet (10 mg total) by mouth daily. Patient not taking: Reported on 05/15/2022 02/13/22    Leath-Warren, Alda Lea, NP  chlorzoxazone (PARAFON) 500 MG tablet Take 1 tablet (500 mg total) by mouth 3 (three) times daily as needed. Patient not taking: Reported on 05/15/2022 01/29/20   Elvia Collum M, DO  enalapril (VASOTEC) 20 MG tablet Take 10 mg by mouth daily.    [provider]  esomeprazole (NEXIUM) 40 MG capsule TAKE 1 CAPSULE DAILY BEFOREBREAKFAST 07/29/20   Lovena Le, Malena M, DO  finasteride (PROSCAR) 5 MG tablet Take 1 tablet (5 mg total) by mouth daily. 07/29/20   Lovena Le, Malena M, DO  fluticasone (FLONASE) 50 MCG/ACT nasal spray Place 2 sprays into both nostrils daily. Patient not taking: Reported on 05/15/2022 02/13/22   Leath-Warren, Alda Lea, NP  glucose blood test strip Use to test blood sugar once daily. Use as instructed. One touch, verio strips. Dx:E11.9 01/29/20   Elvia Collum M, DO  metFORMIN (GLUCOPHAGE XR) 500 MG 24 hr tablet Take 1 tablet (500 mg total) by mouth daily with breakfast. 09/30/20   Elvia Collum M, DO  metroNIDAZOLE (METROCREAM) 0.75 % cream Apply 1 Application topically daily. Patient not taking: Reported on 05/15/2022    [provider]  Multiple Vitamin (MULTIVITAMIN) tablet Take 1 tablet by mouth daily. Patient not taking: Reported on 05/15/2022    [provider]  Multiple Vitamins-Minerals (MULTIVITAMIN MEN 50+ PO) Take 1 tablet by mouth daily.  Patient not taking: Reported on 05/15/2022    [provider]    Physical Exam: Vitals:   06/08/22 1920 06/08/22 1921 06/08/22 2007  BP:  (!) 177/89   Pulse:  90   Resp:  17   Temp:  98.7 F (37.1 C) 98.2 F (36.8 C)  TempSrc:  Oral Oral  SpO2:  98%   Weight: 98.9 kg    Height: '5\' 7"'$  (1.702 m)     Constitutional: NAD, calm, comfortable Respiratory: clear to auscultation bilaterally, no wheezing, no crackles. Normal respiratory effort. No accessory muscle use.  Cardiovascular: Regular rate and rhythm, no murmurs / rubs / gallops. No extremity edema. 2+ pedal  pulses. No carotid bruits.  Abdomen: no tenderness, no masses palpated. No hepatosplenomegaly. Bowel sounds positive.  Neurologic: CN 2-12 grossly intact. Sensation intact, DTR normal. Strength 5/5 in all 4.  Psychiatric: Normal judgment and insight. Alert and oriented x 3. Normal mood.   Data Reviewed:     CT on 06/08/22 (visible via PACS): IMPRESSION: 1. New 5 mm calculus in the proximal left ureter. No hydronephrosis. 2. Otherwise unchanged left nephrolithiasis. Resolved right nephrolithiasis. 3. Unchanged severe hepatic steatosis with morphologic changes suggestive of cirrhosis. 4. Aortic Atherosclerosis (ICD10-I70.0).   Assessment and Plan: * UTI (urinary tract infection) UTI complicated by presence of L ureteral stone.  With L flank pain but  WITHOUT hydronephrosis according to CT scan. 1/4 SIRS (WBC). EDP reportedly discussed with Dr. Gilford Rile, EDP tells me that Dr. Gilford Rile felt that: Doesn't need ureteral stent Patient can be discharged home with ABx treatment and close office follow up. EDP concerned given the flank pain, N/V, presence of ureteral stone, requests admission for obs and IV ABx Rocephin UCx pending Repeat CBC in AM Trend lactates 1L IVF bolus in ED, will put on 125 cc/hr for tonight (though despite initial lactate of 3.5 his BP remains HIGH). Start flomax Clear liquid diet for now. Call urology back if patient worsens at all.  AKI (acute kidney injury) ([place]) Pt with mild AKI today, creat of 1.4 up from a 1.1 baseline. IVF Hold ACEi Repeat BMP in AM Further work up if this worsens  Diabetes ([place]) Hold metformin Trend lactate SSI, sensitive scale Q4H  Essential hypertension, benign Hold ACEi in setting of AKI PRN labetalol IV for now      Advance Care Planning:   Code Status: Full Code  Consults: EDP spoke with Dr. Gilford Rile (Urology)  Family Communication: Wife at bedside  Severity of Illness: The appropriate patient status for this patient  is OBSERVATION. Observation status is judged to be reasonable and necessary in order to provide the required intensity of service to ensure the patient's safety. The patient's presenting symptoms, physical exam findings, and initial radiographic and laboratory data in the context of their medical condition is felt to place them at decreased risk for further clinical deterioration. Furthermore, it is anticipated that the patient will be medically stable for discharge from the hospital within 2 midnights of admission.   Author: Etta Quill., DO 06/08/2022 9:52 PM  For on call review [email or web address].

## 2022-06-09 DIAGNOSIS — Z6834 Body mass index (BMI) 34.0-34.9, adult: Secondary | ICD-10-CM | POA: Diagnosis not present

## 2022-06-09 DIAGNOSIS — R109 Unspecified abdominal pain: Secondary | ICD-10-CM | POA: Diagnosis not present

## 2022-06-09 DIAGNOSIS — R319 Hematuria, unspecified: Secondary | ICD-10-CM

## 2022-06-09 DIAGNOSIS — N390 Urinary tract infection, site not specified: Secondary | ICD-10-CM

## 2022-06-09 LAB — CBC
HCT: 37.1 % — ABNORMAL LOW (ref 39.0–52.0)
Hemoglobin: 12 g/dL — ABNORMAL LOW (ref 13.0–17.0)
MCH: 32.3 pg (ref 26.0–34.0)
MCHC: 32.3 g/dL (ref 30.0–36.0)
MCV: 99.7 fL (ref 80.0–100.0)
Platelets: 149 10*3/uL — ABNORMAL LOW (ref 150–400)
RBC: 3.72 MIL/uL — ABNORMAL LOW (ref 4.22–5.81)
RDW: 15.5 % (ref 11.5–15.5)
WBC: 12.1 10*3/uL — ABNORMAL HIGH (ref 4.0–10.5)
nRBC: 0 % (ref 0.0–0.2)

## 2022-06-09 LAB — GLUCOSE, CAPILLARY
Glucose-Capillary: 133 mg/dL — ABNORMAL HIGH (ref 70–99)
Glucose-Capillary: 138 mg/dL — ABNORMAL HIGH (ref 70–99)
Glucose-Capillary: 120 mg/dL — ABNORMAL HIGH (ref 70–99)

## 2022-06-09 LAB — BASIC METABOLIC PANEL
Anion gap: 7 (ref 5–15)
BUN: 20 mg/dL (ref 8–23)
CO2: 23 mmol/L (ref 22–32)
Calcium: 8.1 mg/dL — ABNORMAL LOW (ref 8.9–10.3)
Chloride: 104 mmol/L (ref 98–111)
Creatinine, Ser: 1.31 mg/dL — ABNORMAL HIGH (ref 0.61–1.24)
GFR, Estimated: 59 mL/min — ABNORMAL LOW (ref >60)
Glucose, Bld: 157 mg/dL — ABNORMAL HIGH (ref 70–99)
Potassium: 4.2 mmol/L (ref 3.5–5.1)
Sodium: 134 mmol/L — ABNORMAL LOW (ref 135–145)

## 2022-06-09 LAB — HIV ANTIBODY (ROUTINE TESTING W REFLEX): HIV Screen 4th Generation wRfx: NONREACTIVE

## 2022-06-09 MED ORDER — TAMSULOSIN HCL 0.4 MG PO CAPS: 0.4000 mg | ORAL_CAPSULE | Freq: Every day | ORAL | 0 refills | Status: AC

## 2022-06-09 MED ORDER — TAMSULOSIN HCL 0.4 MG PO CAPS
0.4000 mg | ORAL_CAPSULE | Freq: Every day | ORAL | Status: DC
  Administered 2022-06-09: 0.4 mg via ORAL
  Filled 2022-06-09: qty 1

## 2022-06-09 MED ORDER — ONDANSETRON HCL 4 MG PO TABS: 4.0000 mg | ORAL_TABLET | Freq: Four times a day (QID) | ORAL | 0 refills | Status: DC | PRN

## 2022-06-09 MED ORDER — CEFDINIR 300 MG PO CAPS: 300.0000 mg | ORAL_CAPSULE | Freq: Two times a day (BID) | ORAL | 0 refills | Status: DC

## 2022-06-09 NOTE — TOC CM/SW Note (Signed)
Transition of Care [facility]) Screening Note  Patient Details  Name: Mark Leonard Date of Birth: [birth year]  Transition of Care [facility]) CM/SW Contact:    Sherie Don, LCSW Phone Number: 06/09/2022, 10:16 AM  Transition of Care Department [facility]) has reviewed patient and no TOC needs have been identified at this time. We will continue to monitor patient advancement through interdisciplinary progression rounds. If new patient transition needs arise, please place a TOC consult.

## 2022-06-09 NOTE — Progress Notes (Signed)
Urine strained and observed 2 small salt grain stones, per NT  D [employer]

## 2022-06-09 NOTE — Discharge Summary (Signed)
Physician Discharge Summary   Patient: Mark Leonard MRN: [id number] DOB: July 01, 1950  Admit date:     06/08/2022  Discharge date: 06/09/22  Discharge Physician: Patrecia Pour   PCP: [facility]   Recommendations at discharge: Follow up with PCP and urology within the next week. [facility] to call patient to confirm appointment. Discharged on omnicef and flomax.  Suggest monitoring CBC and BMP.   Discharge Diagnoses: Principal Problem:   UTI (urinary tract infection) Active Problems:   Left ureteral stone   AKI (acute kidney injury) ([place])   Essential hypertension, benign   Diabetes [facility])  Hospital Course: Mark Leonard is a 72 y.o. male with medical history significant of DM2, HTN, prior kidney stones.   Pt in to ED with L flank pain, chills, vomiting for a couple of days now.  Went to PCP who got CT scan that shows 96m stone in proximal L ureter.   Pt sent in to ED, treated for UTI with improved pain. Urology recommends outpatient follow up.  Assessment and Plan: Nonobstructive, symptomatic left ureterolithiasis: 595mper OOH CT scan without hydronephrosis. UOP wnl.  - D/w urology, Dr. HaNevada Cranerecommends tamsulosin x30 days and omnicef for possible UTI (UA nitrite negative, rare bacteria) x7 days. Only 1/4 SIRS (WBC). 0 EDP reportedly discussed with Dr. WiGilford Rileho recommended that the patient: Doesn't need ureteral stent. Patient can be discharged home with ABx treatment and close office follow up. EDP concerned given the flank pain, N/V, presence of ureteral stone, requested admission for obs and IV ABx. This was done and the following morning the patient feels well.    AKI: Pt with mild AKI Creat of 1.46 though improving with fluids.  - ACE held, BP up so ok to restart now that infection is being treated and pt hydrated.  - Monitor BMP  T2DM: Continue home medications. Suspect primary cause of lactic acid elevation is metformin in the absence of hypotension  or true sepsis.    Essential hypertension, benign   Consultants: Urology, Dr. WiGilford Riley EDP and Dr. HaNevada Cranen day of discharge. Procedures performed: None  Disposition: Home Diet recommendation: Push fluids as tolerated DISCHARGE MEDICATION: Allergies as of 06/09/2022       Reactions   Tape Other (See Comments)   Adhesive /RLehman Prom      Medication List     TAKE these medications    allopurinol 300 MG tablet Commonly known as: ZYLOPRIM Take 1 tablet (300 mg total) by mouth daily.   blood glucose meter kit and supplies Kit Dispense based on patient and insurance preference. Use daily as directed. (FOR ICD-10 code E11.9)   cefdinir 300 MG capsule Commonly known as: OMNICEF Take 1 capsule (300 mg total) by mouth 2 (two) times daily for 7 days.   enalapril 20 MG tablet Commonly known as: VASOTEC Take 20 mg by mouth every evening.   esomeprazole 40 MG capsule Commonly known as: NEXIUM TAKE 1 CAPSULE DAILY BEFOREBREAKFAST   finasteride 5 MG tablet Commonly known as: PROSCAR Take 1 tablet (5 mg total) by mouth daily.   fluticasone 50 MCG/ACT nasal spray Commonly known as: FLONASE Place 2 sprays into both nostrils daily. What changed:  when to take this reasons to take this   glucose blood test strip Use to test blood sugar once daily. Use as instructed. One touch, verio strips. Dx:E11.9   metFORMIN 500 MG 24 hr tablet Commonly known as: Glucophage XR Take 1 tablet (500  mg total) by mouth daily with breakfast. What changed: when to take this   tamsulosin 0.4 MG Caps capsule Commonly known as: FLOMAX Take 1 capsule (0.4 mg total) by mouth daily. Start taking on: June 10, 2022        Follow-up Information     [facility] Follow up.   Contact information: [place] [phone]         [place] Follow up.   Why: You will be contacted by the office to arrange follow up in 1 week. Contact  information: [place] [phone]               Discharge Exam: Mark Leonard Weights   06/08/22 1920 06/08/22 2359  Weight: 98.9 kg 99.5 kg  BP (!) 157/78 (BP Location: Left Arm)   Pulse 96   Temp 98.3 F (36.8 C) (Oral)   Resp 18   Ht '5\' 7"'$  (1.702 m)   Wt 99.5 kg   SpO2 96%   BMI 34.36 kg/m   No distress, well-appearing male Clear, nonlabored RRR, no MRG or pitting edema Soft, NT, ND, mild tenderness to deep palpation on left flank. No true CVA tenderness to percussion.  Condition at discharge: stable  The results of significant diagnostics from this hospitalization (including imaging, microbiology, ancillary and laboratory) are listed below for reference.   Imaging Studies: No results found.  Microbiology: Results for orders placed or performed during the hospital encounter of 06/08/22  Blood culture (routine x 2)     Status: None (Preliminary result)   Collection Time: 06/08/22  8:00 PM   Specimen: BLOOD  Result Value Ref Range Status   Specimen Description   Final    BLOOD LEFT ANTECUBITAL Performed at [facility] [address], [place], [place] [address]    Special Requests   Final    BOTTLES DRAWN AEROBIC AND ANAEROBIC Blood Culture adequate volume Performed at [facility] [address], [place], [place] [address]    Culture   Final    NO GROWTH < 12 HOURS Performed at [facility] [address] [place], [place] [address]    Report Status PENDING  Incomplete  Blood culture (routine x 2)     Status: None (Preliminary result)   Collection Time: 06/08/22  8:51 PM   Specimen: BLOOD  Result Value Ref Range Status   Specimen Description   Final    BLOOD RIGHT ANTECUBITAL Performed at [facility], [place] [address] [place], [place] [address]    Special Requests   Final    BOTTLES DRAWN AEROBIC AND ANAEROBIC Blood Culture adequate volume Performed at [facility] [address], [place], [place] [address]    Culture   Final    NO GROWTH < 12 HOURS Performed at [facility] [address], [place], [place] [address]    Report Status PENDING  Incomplete  Resp panel by RT-PCR (RSV, Flu A&B, Covid) Anterior Nasal Swab     Status: None   Collection Time: 06/08/22  9:05 PM   Specimen: Anterior Nasal Swab  Result Value Ref Range Status   SARS Coronavirus 2 by RT PCR NEGATIVE NEGATIVE Final    Comment: (NOTE) SARS-CoV-2 target nucleic acids are NOT DETECTED.  The SARS-CoV-2 RNA is generally detectable in upper respiratory specimens during the acute phase of infection. The lowest  concentration of SARS-CoV-2 viral copies this assay can detect is 138 copies/mL. A negative result does not preclude SARS-Cov-2 infection and should not be used as the sole basis for treatment or other patient management decisions. A negative result may occur with  improper specimen collection/handling, submission of specimen other than nasopharyngeal swab, presence of viral mutation(s) within the areas targeted by this assay, and inadequate number of viral copies(<138 copies/mL). A negative result must be combined with clinical observations, patient history, and epidemiological information. The expected result is Negative.  Fact Sheet for Patients:  EntrepreneurPulse.com.au  Fact Sheet for Healthcare Providers:  IncredibleEmployment.be  This test is no t yet approved or cleared by the [place] FDA and  has been authorized for detection and/or diagnosis of SARS-CoV-2 by FDA under an Emergency Use Authorization (EUA). This EUA will remain  in effect (meaning this test can be used) for the duration of the COVID-19 declaration under Section 564(b)(1) of the Act, 21 U.S.C.section 360bbb-3(b)(1), unless the authorization is terminated  or revoked sooner.       Influenza A by PCR NEGATIVE NEGATIVE  Final   Influenza B by PCR NEGATIVE NEGATIVE Final    Comment: (NOTE) The Xpert Xpress SARS-CoV-2/FLU/RSV plus assay is intended as an aid in the diagnosis of influenza from Nasopharyngeal swab specimens and should not be used as a sole basis for treatment. Nasal washings and aspirates are unacceptable for Xpert Xpress SARS-CoV-2/FLU/RSV testing.  Fact Sheet for Patients: EntrepreneurPulse.com.au  Fact Sheet for Healthcare Providers: IncredibleEmployment.be  This test is not yet approved or cleared by the [place] FDA and has been authorized for detection and/or diagnosis of SARS-CoV-2 by FDA under an Emergency Use Authorization (EUA). This EUA will remain in effect (meaning this test can be used) for the duration of the COVID-19 declaration under Section 564(b)(1) of the Act, 21 U.S.C. section 360bbb-3(b)(1), unless the authorization is terminated or revoked.     Resp Syncytial Virus by PCR NEGATIVE NEGATIVE Final    Comment: (NOTE) Fact Sheet for Patients: EntrepreneurPulse.com.au  Fact Sheet for Healthcare Providers: IncredibleEmployment.be  This test is not yet approved or cleared by the [place] FDA and has been authorized for detection and/or diagnosis of SARS-CoV-2 by FDA under an Emergency Use Authorization (EUA). This EUA will remain in effect (meaning this test can be used) for the duration of the COVID-19 declaration under Section 564(b)(1) of the Act, 21 U.S.C. section 360bbb-3(b)(1), unless the authorization is terminated or revoked.  Performed at [facility], [place] [address], [place],  [address]     Labs: CBC: Recent Labs  Lab 06/08/22 2000 06/09/22 0032  WBC 15.0* 12.1*  NEUTROABS 12.1*  --   HGB 14.1 12.0*  HCT 43.6 37.1*  MCV 101.4* 99.7  PLT 205 123456*   Basic Metabolic Panel: Recent Labs  Lab 06/08/22 2000 06/09/22 0032  NA 137 134*   K 4.3 4.2  CL 103 104  CO2 25 23  GLUCOSE 108* 157*  BUN 19 20  CREATININE 1.46* 1.31*  CALCIUM 8.7* 8.1*   Liver Function Tests: Recent Labs  Lab 06/08/22 2000  AST 46*  ALT 34  ALKPHOS 131*  BILITOT 0.4  PROT 8.3*  ALBUMIN 3.6   CBG: Recent Labs  Lab 06/08/22 2351 06/09/22 0426 06/09/22 0742  GLUCAP 162* 133* 138*    Discharge time spent: greater than 30 minutes.  Signed: Patrecia Pour, MD [facility] 06/09/2022

## 2022-06-10 LAB — HEMOGLOBIN A1C
Hgb A1c MFr Bld: 7.3 % — ABNORMAL HIGH (ref 4.8–5.6)
Mean Plasma Glucose: 163 mg/dL

## 2022-06-13 LAB — CULTURE, BLOOD (ROUTINE X 2)
Culture: NO GROWTH
Culture: NO GROWTH
Special Requests: ADEQUATE
Special Requests: ADEQUATE

## 2022-06-15 DIAGNOSIS — Z125 Encounter for screening for malignant neoplasm of prostate: Secondary | ICD-10-CM | POA: Diagnosis not present

## 2022-06-15 DIAGNOSIS — N200 Calculus of kidney: Secondary | ICD-10-CM | POA: Diagnosis not present

## 2022-06-15 DIAGNOSIS — R338 Other retention of urine: Secondary | ICD-10-CM | POA: Diagnosis not present

## 2022-06-16 ENCOUNTER — Encounter: Payer: Self-pay | Admitting: Gastroenterology

## 2022-06-17 ENCOUNTER — Telehealth: Payer: Self-pay | Admitting: *Deleted

## 2022-06-17 NOTE — Telephone Encounter (Signed)
Pt says he recently was admitted to hospital because of kidney stone and they thought he may be septic. He spent over night for observation and was let go the next day. He is worried that he will start running fever and having chills again. He is on antibiotics that should be finished by Friday. His procedure is scheduled for 06/22/22 and he also has a call into his PCP to get his input on the situation. Does he need to reschedule or will he be ok to have procedure on Monday?  Please advise. Thank you

## 2022-06-17 NOTE — Telephone Encounter (Signed)
We can cancel colonoscopy for now and follow-up one he is feeling better to schedule his colonoscopy.

## 2022-06-18 NOTE — Telephone Encounter (Signed)
Pt informed of providers message. Verbalized understanding. Colonoscopy cancelled.

## 2022-06-22 ENCOUNTER — Encounter (HOSPITAL_COMMUNITY): Admission: RE | Payer: Self-pay | Source: Home / Self Care

## 2022-06-22 ENCOUNTER — Ambulatory Visit (HOSPITAL_COMMUNITY): Admission: RE | Admit: 2022-06-22 | Payer: PPO | Source: Home / Self Care | Admitting: Internal Medicine

## 2022-06-22 SURGERY — COLONOSCOPY WITH PROPOFOL
Anesthesia: Monitor Anesthesia Care

## 2022-06-30 DIAGNOSIS — N390 Urinary tract infection, site not specified: Secondary | ICD-10-CM | POA: Diagnosis not present

## 2022-06-30 DIAGNOSIS — Z7689 Persons encountering health services in other specified circumstances: Secondary | ICD-10-CM | POA: Diagnosis not present

## 2022-07-02 DIAGNOSIS — N201 Calculus of ureter: Secondary | ICD-10-CM | POA: Diagnosis not present

## 2022-07-05 NOTE — Progress Notes (Unsigned)
Referring Provider: [facility], [facility] Primary Care Physician:  [facility] Primary GI Physician: Dr. Gala Romney  Chief Complaint  Patient presents with   Follow-up    HPI:   Mark Leonard is a 72 y.o. male with history of type 2 diabetes, HTN, adenomatous colon polyps overdue for surveillance, GERD, fatty liver, chronically elevated liver enzymes since 2013, presenting today for follow-up.  Prior workup for elevated LFTs: ANA, AMA, ASMA, negative.  IgM and IgG within normal limits.  Mild elevation of IgA at 568.  Hep C negative. No immunity to hepatitis B.  Immune to hepatitis A.  Iron panel within normal limits.  TSH normal. -Recommended hepatitis B vaccination.   Received ultrasound report dated 09/17/2021 showing hepatomegaly (liver 20.4 cm) with steatosis, simple hepatic cyst measuring less than 1 cm, no worrisome masses or lesions, patent portal vein, normal spleen.  Last seen in our office 01/19/2022.  He was doing well without symptoms of decompensated liver disease.  Reflux well-controlled on Nexium.  No GI concerns. He had gained about 8 lbs over the last 10 months. Planned to update HFP.  Counseled on fatty liver.  Recommended 23-month follow-up.   He was later triaged for colonoscopy.Colonoscopy was scheduled on 06/22/2022, but procedure was canceled due to hospital admission for nonobstructive, symptomatic left ureterolithiasis.     Labs I ordered but not completed, but labs completed 06/08/2022 with AST 46 (H), ALT 34, alk phos 131 (H).  This is overall stable/improved from 1 year ago.  Platelets remain within normal limits.  Reviewed recent CT A/P without contrast 06/08/2022 that showed severe hepatic steatosis with morphologic changes suggesting cirrhosis   Today: Still hasn't passed kidney stone. Going back for an x-ray to see if he needs lithotripsy.   Fatty liver/elevated LFTs/possible cirrhosis:  A1C 7.3 on 06/08/22.  Down 2 lbs since last visit.  Trying to change his eating habits.  No abdominal distention, lower extremity edema, yellowing of the eyes or skin, bruising/bleeding, mental status changes.  GERD: Well controlled on Nexium 40 mg daily. No dysphagia, nausea, or vomiting.   History of polyps:  Bowels are moving well.  No BRBPR or melena.   Wants to check with insurance regarding EGD.  Past Medical History:  Diagnosis Date   Acid reflux    Allergy    COVID 04/2019   stuffy head sinus problems sob fatigue x 2 weeks   Diabetes mellitus without complication    Elevated LFTs    since 2013   Fatty liver    Gout    Heart murmur    mild no cardiologist for last 20 yrs    History of kidney stones    Hypertension    Mitral regurgitation    mild    Prostate hypertrophy    Venous stasis    legs brown no vein doctor   Wears glasses     Past Surgical History:  Procedure Laterality Date   CARDIOVASCULAR STRESS TEST     CHOLECYSTECTOMY  late 1990's   COLONOSCOPY     COLONOSCOPY N/A 05/17/2019   Surgeon: Daneil Dolin, MD;  seven 4-6 mm polyps removed.  Pathology with tubular adenomas and 1 hyperplastic polyp.  Recommended 3-year repeat.   CYSTOSCOPY WITH LITHOLAPAXY N/A 04/12/2020   Procedure: CYSTOSCOPY WITH  BLADDER AND KIDNEY LITHOLAPAXY;  Surgeon: Alexis Frock, MD;  Location: [facility];  Service: Urology;  Laterality: N/A;   CYSTOSCOPY WITH RETROGRADE PYELOGRAM, URETEROSCOPY AND STENT PLACEMENT  Bilateral 04/12/2020   Procedure: CYSTOSCOPY WITH RETROGRADE PYELOGRAM, URETEROSCOPY AND STENT PLACEMENT;  Surgeon: Alexis Frock, MD;  Location: [facility];  Service: Urology;  Laterality: Bilateral;   EXTRACORPOREAL SHOCK WAVE LITHOTRIPSY  last done 18 yrs ago   4-5 times   HOLMIUM LASER APPLICATION Bilateral [id number]   Procedure: HOLMIUM LASER APPLICATION;  Surgeon: Alexis Frock, MD;  Location: [facility];  Service: Urology;  Laterality: Bilateral;   KIDNEY  STONE SURGERY  1970's done x 2   POLYPECTOMY  05/17/2019   Procedure: POLYPECTOMY;  Surgeon: Daneil Dolin, MD;  [facility];  Service: Endoscopy;;   VASECTOMY  late 1990's    Current Outpatient Medications  Medication Sig Dispense Refill   allopurinol (ZYLOPRIM) 300 MG tablet Take 1 tablet (300 mg total) by mouth daily. 90 tablet 1   blood glucose meter kit and supplies KIT Dispense based on patient and insurance preference. Use daily as directed. (FOR ICD-10 code E11.9) 1 each 5   enalapril (VASOTEC) 20 MG tablet Take 20 mg by mouth every evening.     esomeprazole (NEXIUM) 40 MG capsule TAKE 1 CAPSULE DAILY BEFOREBREAKFAST 90 capsule 1   finasteride (PROSCAR) 5 MG tablet Take 1 tablet (5 mg total) by mouth daily. 90 tablet 1   glucose blood test strip Use to test blood sugar once daily. Use as instructed. One touch, verio strips. Dx:E11.9 100 each 12   metFORMIN (GLUCOPHAGE XR) 500 MG 24 hr tablet Take 1 tablet (500 mg total) by mouth daily with breakfast. (Patient taking differently: Take 500 mg by mouth 2 (two) times daily with a meal.) 90 tablet 1   metronidazole (NORITATE) 1 % cream Apply 1 Application topically every other day.     tamsulosin (FLOMAX) 0.4 MG CAPS capsule Take 1 capsule (0.4 mg total) by mouth daily. 30 capsule 0   fluticasone (FLONASE) 50 MCG/ACT nasal spray Place 2 sprays into both nostrils daily. (Patient not taking: Reported on 07/08/2022) 16 g 0   ondansetron (ZOFRAN) 4 MG tablet Take 1 tablet (4 mg total) by mouth every 6 (six) hours as needed for nausea. (Patient not taking: Reported on 07/08/2022) 20 tablet 0   No current facility-administered medications for this visit.    Allergies as of 07/08/2022 - Review Complete 07/08/2022  Allergen Reaction Noted   Tape Other (See Comments) 06/22/2012    Family History  Problem Relation Age of Onset   Hypertension Mother    Pulmonary disease Father    Cirrhosis Maternal Grandfather        unknown  etiology, non-alcoholic   Liver cancer Maternal Grandfather    Cirrhosis Cousin        unknown etiology, non-alcoholic   Liver cancer Cousin    Cirrhosis Cousin        alcoholic   Colon cancer Neg Hx     Social History   Socioeconomic History   Marital status: Married    Spouse name: Not on file   Number of children: Not on file   Years of education: Not on file   Highest education level: Not on file  Occupational History   Not on file  Tobacco Use   Smoking status: Former    Packs/day: 2.00    Years: 26.00    Additional pack years: 0.00    Total pack years: 52.00    Types: Cigarettes    Quit date: 04/04/1999    Years since quitting: 23.2   Smokeless  tobacco: Former    Quit date: 05/26/1998  Vaping Use   Vaping Use: Never used  Substance and Sexual Activity   Alcohol use: No    Alcohol/week: 0.0 standard drinks of alcohol   Drug use: No   Sexual activity: Not on file  Other Topics Concern   Not on file  Social History Narrative   Not on file   Social Determinants of Health   Financial Resource Strain: Not on file  Food Insecurity: No Food Insecurity (06/09/2022)   Hunger Vital Sign    Worried About Running Out of Food in the Last Year: Never true    Ran Out of Food in the Last Year: Never true  Transportation Needs: No Transportation Needs (06/09/2022)   PRAPARE - Hydrologist (Medical): No    Lack of Transportation (Non-Medical): No  Physical Activity: Not on file  Stress: Not on file  Social Connections: Not on file    Review of Systems: Gen: Denies fever, chills, cold or flulike symptoms, presyncope, syncope. CV: Denies chest pain, palpitations. Resp: Denies dyspnea, cough. GI: See HPI Heme: See HPI  Physical Exam: BP 134/77 (BP Location: Left Arm, Patient Position: Sitting, Cuff Size: Normal)   Pulse 82   Temp 97.7 F (36.5 C) (Temporal)   Ht 5\' 7"  (1.702 m)   Wt 216 lb (98 kg)   SpO2 98%   BMI 33.83 kg/m  General:    Alert and oriented. No distress noted. Pleasant and cooperative.  Head:  Normocephalic and atraumatic. Eyes:  Conjuctiva clear without scleral icterus. Heart:  S1, S2 present without murmurs appreciated. Lungs:  Clear to auscultation bilaterally. No wheezes, rales, or rhonchi. No distress.  Abdomen:  +BS, soft, non-tender and non-distended. No rebound or guarding. No HSM or masses noted. Msk:  Symmetrical without gross deformities. Normal posture. Extremities:  Without edema. Neurologic:  Alert and  oriented x4 Psych:  Normal mood and affect.    Assessment:  72 y.o. male with history of type 2 diabetes, HTN, adenomatous colon polyps overdue for surveillance, GERD, fatty liver, chronically elevated liver enzymes since 2013, presenting today for follow-up.   Fatty liver/elevated LFTs/possible cirrhosis: Extensive benign serologic evaluation him of elevated liver enzymes in the past detailed in HPI.  Suspect LFT elevation secondary to chronic fatty liver/NASH.  Most recent labs 06/08/2022 with AST 46, ALT 34, alk phos 131, overall stable/slightly improved from 1 year ago.  Platelets remain within normal limits.  Notably, recent CT A/P without contrast 06/08/2022 with severe hepatic steatosis, morphologic changes suggesting cirrhosis.  He has no signs or symptoms of decompensated liver disease.  I recommended checking INR and RUQ ultrasound with elastography to reevaluate possibility of cirrhosis.  We discussed that if he does have cirrhosis, he would need an EGD.  If EGD is needed, he prefers to check with insurance regarding cost prior to scheduling.  Otherwise, we discussed the importance of weight loss through healthy diet and exercise as well as tight glycemic control.  His most recent A1c was 7.3.  Reports he is working on his eating habits and has lost 2 pounds in the last 6 months.  He is immune to hepatitis A.  No immunity to hepatitis B.  History of colon polyps: Overdue for surveillance  colonoscopy.  Last colonoscopy in February 2021 with 7 4-6 mm polyps removed.  Pathology with tubular adenomas and 1 hyperplastic polyp with recommendations to repeat in 3 years.  He has no significant  lower GI symptoms or alarm symptoms.  GERD: Well-controlled on Nexium 40 mg daily.    Plan:  RUQ ultrasound with elastography. INR. Complete hepatitis B vaccination. Recommend EGD if evidence of cirrhosis on ultrasound with elastography. We will arrange colonoscopy with propofol with Dr. Gala Romney in the near future pending ultrasound results. Counseled on fatty liver with the importance of weight loss through diet and exercise as well as tight glycemic control. Continue to avoid alcohol. Continue Nexium 40 mg daily. Follow-up in 6 months or sooner if needed.   Aliene Altes, PA-C [facility] 07/08/2022

## 2022-07-08 ENCOUNTER — Encounter: Payer: Self-pay | Admitting: Gastroenterology

## 2022-07-08 ENCOUNTER — Ambulatory Visit (INDEPENDENT_AMBULATORY_CARE_PROVIDER_SITE_OTHER): Payer: PPO | Admitting: Gastroenterology

## 2022-07-08 VITALS — BP 134/77 | HR 82 | Temp 97.7°F | Ht 67.0 in | Wt 216.0 lb

## 2022-07-08 DIAGNOSIS — R932 Abnormal findings on diagnostic imaging of liver and biliary tract: Secondary | ICD-10-CM

## 2022-07-08 DIAGNOSIS — K760 Fatty (change of) liver, not elsewhere classified: Secondary | ICD-10-CM

## 2022-07-08 DIAGNOSIS — R7989 Other specified abnormal findings of blood chemistry: Secondary | ICD-10-CM | POA: Diagnosis not present

## 2022-07-08 DIAGNOSIS — K219 Gastro-esophageal reflux disease without esophagitis: Secondary | ICD-10-CM

## 2022-07-08 DIAGNOSIS — Z86010 Personal history of colonic polyps: Secondary | ICD-10-CM | POA: Diagnosis not present

## 2022-07-08 NOTE — Patient Instructions (Signed)
We will arrange to have an ultrasound of your liver at [facility].  Please have INR checked at your primary care provider's office.  Request that they fax results to our office.  Have hepatitis B vaccination completed.  If there is evidence of cirrhosis on her ultrasound, we will need to arrange you to have an upper endoscopy to screen for swollen blood vessels in your esophagus (esophageal varices).  If there is no evidence of cirrhosis on your ultrasound, we will proceed with a colonoscopy only.  Diet/lifestyle recommendations: Recommend 1-2# weight loss per week until ideal body weight through exercise & diet. Low fat/cholesterol diet.   Avoid sweets, sodas, fruit juices, sweetened beverages like tea, etc. Gradually increase exercise from 15 min daily up to 1 hr per day 5 days/week. Alcohol use.   Continue Nexium 40 mg daily for reflux.  We will see you back in the office in about 6 months.  Do not hesitate to call sooner if you have questions or concerns.  Aliene Altes, PA-C [facility]

## 2022-07-09 ENCOUNTER — Encounter: Payer: Self-pay | Admitting: Gastroenterology

## 2022-07-10 ENCOUNTER — Ambulatory Visit (HOSPITAL_COMMUNITY)
Admission: RE | Admit: 2022-07-10 | Discharge: 2022-07-10 | Disposition: A | Discharge status: Home / Self Care | Payer: PPO | Source: Ambulatory Visit | Attending: Gastroenterology | Admitting: Gastroenterology

## 2022-07-10 DIAGNOSIS — K760 Fatty (change of) liver, not elsewhere classified: Secondary | ICD-10-CM | POA: Insufficient documentation

## 2022-07-10 DIAGNOSIS — R932 Abnormal findings on diagnostic imaging of liver and biliary tract: Secondary | ICD-10-CM | POA: Insufficient documentation

## 2022-07-10 DIAGNOSIS — K7689 Other specified diseases of liver: Secondary | ICD-10-CM | POA: Diagnosis not present

## 2022-07-10 DIAGNOSIS — R7989 Other specified abnormal findings of blood chemistry: Secondary | ICD-10-CM | POA: Diagnosis not present

## 2022-07-10 DIAGNOSIS — R3915 Urgency of urination: Secondary | ICD-10-CM | POA: Diagnosis not present

## 2022-07-10 DIAGNOSIS — N201 Calculus of ureter: Secondary | ICD-10-CM | POA: Diagnosis not present

## 2022-07-11 LAB — PROTIME-INR
INR: 1 (ref 0.9–1.2)
Prothrombin Time: 11.1 s (ref 9.1–12.0)

## 2022-07-14 ENCOUNTER — Other Ambulatory Visit: Payer: Self-pay | Admitting: *Deleted

## 2022-07-14 DIAGNOSIS — K7469 Other cirrhosis of liver: Secondary | ICD-10-CM

## 2022-07-14 DIAGNOSIS — K760 Fatty (change of) liver, not elsewhere classified: Secondary | ICD-10-CM

## 2022-07-15 ENCOUNTER — Other Ambulatory Visit: Payer: Self-pay | Admitting: *Deleted

## 2022-07-15 ENCOUNTER — Encounter: Payer: Self-pay | Admitting: *Deleted

## 2022-07-15 DIAGNOSIS — K760 Fatty (change of) liver, not elsewhere classified: Secondary | ICD-10-CM | POA: Diagnosis not present

## 2022-07-15 DIAGNOSIS — K7469 Other cirrhosis of liver: Secondary | ICD-10-CM | POA: Diagnosis not present

## 2022-07-15 MED ORDER — PEG 3350-KCL-NA BICARB-NACL 420 G PO SOLR: 4000.0000 mL | Freq: Once | ORAL | 0 refills | Status: AC

## 2022-07-16 ENCOUNTER — Encounter: Payer: Self-pay | Admitting: *Deleted

## 2022-07-16 LAB — AFP TUMOR MARKER: AFP, Serum, Tumor Marker: 4.4 ng/mL (ref 0.0–8.4)

## 2022-07-17 DIAGNOSIS — I951 Orthostatic hypotension: Secondary | ICD-10-CM | POA: Diagnosis not present

## 2022-07-17 DIAGNOSIS — E1165 Type 2 diabetes mellitus with hyperglycemia: Secondary | ICD-10-CM | POA: Diagnosis not present

## 2022-07-17 DIAGNOSIS — E875 Hyperkalemia: Secondary | ICD-10-CM | POA: Diagnosis not present

## 2022-07-17 DIAGNOSIS — R7989 Other specified abnormal findings of blood chemistry: Secondary | ICD-10-CM | POA: Diagnosis not present

## 2022-07-20 ENCOUNTER — Ambulatory Visit: Payer: Self-pay | Admitting: Gastroenterology

## 2022-07-27 DIAGNOSIS — L719 Rosacea, unspecified: Secondary | ICD-10-CM | POA: Diagnosis not present

## 2022-07-27 DIAGNOSIS — K219 Gastro-esophageal reflux disease without esophagitis: Secondary | ICD-10-CM | POA: Diagnosis not present

## 2022-07-27 DIAGNOSIS — N400 Enlarged prostate without lower urinary tract symptoms: Secondary | ICD-10-CM | POA: Diagnosis not present

## 2022-07-27 DIAGNOSIS — Z6833 Body mass index (BMI) 33.0-33.9, adult: Secondary | ICD-10-CM | POA: Diagnosis not present

## 2022-07-27 DIAGNOSIS — I10 Essential (primary) hypertension: Secondary | ICD-10-CM | POA: Diagnosis not present

## 2022-07-27 DIAGNOSIS — K760 Fatty (change of) liver, not elsewhere classified: Secondary | ICD-10-CM | POA: Diagnosis not present

## 2022-07-27 DIAGNOSIS — E875 Hyperkalemia: Secondary | ICD-10-CM | POA: Diagnosis not present

## 2022-07-27 DIAGNOSIS — M109 Gout, unspecified: Secondary | ICD-10-CM | POA: Diagnosis not present

## 2022-07-27 DIAGNOSIS — I951 Orthostatic hypotension: Secondary | ICD-10-CM | POA: Diagnosis not present

## 2022-07-27 DIAGNOSIS — E119 Type 2 diabetes mellitus without complications: Secondary | ICD-10-CM | POA: Diagnosis not present

## 2022-08-10 ENCOUNTER — Telehealth: Payer: Self-pay | Admitting: *Deleted

## 2022-08-10 DIAGNOSIS — K219 Gastro-esophageal reflux disease without esophagitis: Secondary | ICD-10-CM | POA: Diagnosis not present

## 2022-08-10 DIAGNOSIS — L719 Rosacea, unspecified: Secondary | ICD-10-CM | POA: Diagnosis not present

## 2022-08-10 DIAGNOSIS — E119 Type 2 diabetes mellitus without complications: Secondary | ICD-10-CM | POA: Diagnosis not present

## 2022-08-10 DIAGNOSIS — Z6833 Body mass index (BMI) 33.0-33.9, adult: Secondary | ICD-10-CM | POA: Diagnosis not present

## 2022-08-10 DIAGNOSIS — E875 Hyperkalemia: Secondary | ICD-10-CM | POA: Diagnosis not present

## 2022-08-10 DIAGNOSIS — I10 Essential (primary) hypertension: Secondary | ICD-10-CM | POA: Diagnosis not present

## 2022-08-10 DIAGNOSIS — I951 Orthostatic hypotension: Secondary | ICD-10-CM | POA: Diagnosis not present

## 2022-08-10 DIAGNOSIS — N400 Enlarged prostate without lower urinary tract symptoms: Secondary | ICD-10-CM | POA: Diagnosis not present

## 2022-08-10 DIAGNOSIS — K760 Fatty (change of) liver, not elsewhere classified: Secondary | ICD-10-CM | POA: Diagnosis not present

## 2022-08-10 DIAGNOSIS — M109 Gout, unspecified: Secondary | ICD-10-CM | POA: Diagnosis not present

## 2022-08-10 NOTE — Telephone Encounter (Signed)
Called pt. Made aware Dr. Jena Gauss not available for 5/23. Rescheduled procedure for 6/13. Aware will send new instructions/pre-op appt. Message sent to endo.

## 2022-08-12 ENCOUNTER — Encounter: Payer: Self-pay | Admitting: Urology

## 2022-08-12 ENCOUNTER — Ambulatory Visit (INDEPENDENT_AMBULATORY_CARE_PROVIDER_SITE_OTHER): Payer: PPO | Admitting: Urology

## 2022-08-12 ENCOUNTER — Ambulatory Visit (HOSPITAL_COMMUNITY)
Admission: RE | Admit: 2022-08-12 | Discharge: 2022-08-12 | Disposition: A | Discharge status: Home / Self Care | Payer: PPO | Source: Ambulatory Visit | Attending: Urology | Admitting: Urology

## 2022-08-12 VITALS — BP 144/79 | HR 84

## 2022-08-12 DIAGNOSIS — Z87442 Personal history of urinary calculi: Secondary | ICD-10-CM

## 2022-08-12 DIAGNOSIS — N138 Other obstructive and reflux uropathy: Secondary | ICD-10-CM

## 2022-08-12 DIAGNOSIS — N200 Calculus of kidney: Secondary | ICD-10-CM | POA: Diagnosis not present

## 2022-08-12 DIAGNOSIS — N401 Enlarged prostate with lower urinary tract symptoms: Secondary | ICD-10-CM

## 2022-08-12 LAB — URINALYSIS, ROUTINE W REFLEX MICROSCOPIC
Bilirubin, UA: NEGATIVE
Glucose, UA: NEGATIVE
Ketones, UA: NEGATIVE
Nitrite, UA: NEGATIVE
RBC, UA: NEGATIVE
Specific Gravity, UA: 1.02 (ref 1.005–1.030)
Urobilinogen, Ur: 0.2 mg/dL (ref 0.2–1.0)
pH, UA: 5.5 (ref 5.0–7.5)

## 2022-08-12 LAB — MICROSCOPIC EXAMINATION: Bacteria, UA: NONE SEEN

## 2022-08-12 MED ORDER — INDAPAMIDE 2.5 MG PO TABS: 2.5000 mg | ORAL_TABLET | Freq: Every day | ORAL | 11 refills | Status: DC

## 2022-08-12 MED ORDER — FINASTERIDE 5 MG PO TABS: 5.0000 mg | ORAL_TABLET | Freq: Every day | ORAL | 3 refills | Status: DC

## 2022-08-12 NOTE — Patient Instructions (Signed)

## 2022-08-12 NOTE — Progress Notes (Signed)
08/12/2022 10:17 AM   Mark Leonard 1951/02/07 [id number]  Referring provider: [facility] [address] [place],  [place] [address]  No chief complaint on file.  HPI: Mark Leonard is a 72yo here for evaluation of recurrent nephrolithiasis and BPH. He had a UTI in February 2024 and was treated with antibiotics. He then developed chills on March 4th and presented to [facility] and was diagnosed with a 5mm left proximal ureteral calculus. He was then saw [facility] and after that appointment he passed multiple calculi. His pain resolved and currently he denies any significant LUTS while on finasteride.  His records from [facility] are as follows: 1 - Prostate Screening - No FHX prostate cancer. He is on finasteride for lower urinary tract sympotms. Gets PSA through PCP and work yearly and "normal" per report. DRE 02/2017 50gm smooth.   03/2020 - PSA 0.5 (PCP labs on finasteride) / DRE 45gm smooth.  06/2021 PSA 0.4 (finasteride) / DRE 45gm smooth  06/2022 PSA (normal per PCP labs), DRE 45g m smooth.   2 - Lower Urinary Tract Symptoms / Urinary Retention - on finasteride per PCP for mix of obstructive and irritative symtpoms. DRE 02/2017 50gm smooth. Cysto 04/2020 with trilobar hypertrophy. Vol 50gm by CT ellipsoid calculation 2022. Episode transient retention after ureteroscopy 2022.   3 - Recurrent Urolithiasis -  Pre 2022 - Rt ureterolithotomy in 1970s x2, SWL x 4 (most recently 2003)  04/2020 - bilateral ureteroscopy / cystolithalopexy to stone free for bilateral renal stones / 2cm bladder stone   Recent Surveillance:  06/2021 - [facility], Renal US - small bilateraal parenchyaml stones, no hydro.  06/2022 - CT - left punctate pap tip calcs (likely parenchymal), no hdyro, normal UA.   4 - Medical Stone Disease / Oliguria / Hypocitraturia -  Eval 2022: BMP, PTH, Urate - normal; Composition - 100% CaOx; 24 Hr Urines - very low volume 1L, mild low citrate 320 ==> TID crystal  lite in water.   PMH sig for DM2 (A1c <6!), Gout, obesity, lap chole. NO ischemic CV disesae / blood thinners. He enjoys walking his shor-haired [place]. His PCP is Mitzi Hansen with [facility]. 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   Today "Mark Leonard" is seen fin f/u above and PVR. Had breif observaiton admission for early pyelo, no obstruction. Treated with cefdinir. CT with punctate (too small to measure) left pap tip calcs, no hydro. UA today w/o bacteruria and he is feeling nearly back to baseline.   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 Beginning last week and continuing through today, he began developing bilateral CVA pain without radiation into the flanks or lower abdomen. He is also describing cloudy urine, increased bladder pressure with associated urgency as well as starting and stopping of his stream. He is no longer on tamsulosin which Dr. Berneice Heinrich discontinued but does remain on finasteride. He saw primary care earlier this week and is now taking Cipro. Today he is beginning to feel a little better. Still feeling a little malaised from a constitutional perspective but denies fevers or chills, nausea/vomiting.   07/10/2022: Mark Leonard returns today for follow-up exam. KUB at last exam was highly suspicious for a distal left ureteral calculus but fortunately there was no obvious obstructive signs on left renal ultrasound performed at that time. I had him resume Flomax and monitor for  any interval stone material passage. Urine culture assessed at that visit was negative for bacterial growth. 2 days ago, the patient passed a stone and fragmented to a couple small pieces. He brought those fragments to his visit today. With interval stone material passage, his previously endorsed bladder pressure and associated urgency as well as intermittency of stream resolved. He has had no interval recurrence  of any dysuria, gross hematuria or left-sided pain/discomfort suggestive of obstructive uropathy. His constitutional symptoms resolved as well. UA today is within normal limits with resolution of previously noted microscopic hematuria.    PMH: Past Medical History:  Diagnosis Date   Acid reflux    Allergy    COVID 04/2019   stuffy head sinus problems sob fatigue x 2 weeks   Diabetes mellitus without complication (HCC)    Elevated LFTs    since 2013   Fatty liver    Gout    Heart murmur    mild no cardiologist for last 20 yrs    History of kidney stones    Hypertension    Mitral regurgitation    mild    Prostate hypertrophy    Venous stasis    legs brown no vein doctor   Wears glasses     Surgical History: Past Surgical History:  Procedure Laterality Date   CARDIOVASCULAR STRESS TEST     CHOLECYSTECTOMY  late 1990's   COLONOSCOPY     COLONOSCOPY N/A 05/17/2019   Surgeon: Corbin Ade, MD;  seven 4-6 mm polyps removed.  Pathology with tubular adenomas and 1 hyperplastic polyp.  Recommended 3-year repeat.   CYSTOSCOPY WITH LITHOLAPAXY N/A 04/12/2020   Procedure: CYSTOSCOPY WITH  BLADDER AND KIDNEY LITHOLAPAXY;  Surgeon: Sebastian Ache, MD;  Location: [facility];  Service: Urology;  Laterality: N/A;   CYSTOSCOPY WITH RETROGRADE PYELOGRAM, URETEROSCOPY AND STENT PLACEMENT Bilateral 04/12/2020   Procedure: CYSTOSCOPY WITH RETROGRADE PYELOGRAM, URETEROSCOPY AND STENT PLACEMENT;  Surgeon: Sebastian Ache, MD;  Location: [facility];  Service: Urology;  Laterality: Bilateral;   EXTRACORPOREAL SHOCK WAVE LITHOTRIPSY  last done 18 yrs ago   4-5 times   HOLMIUM LASER APPLICATION Bilateral 04/12/2020   Procedure: HOLMIUM LASER APPLICATION;  Surgeon: Sebastian Ache, MD;  Location: [facility];  Service: Urology;  Laterality: Bilateral;   KIDNEY STONE SURGERY  1970's done x 2   POLYPECTOMY  05/17/2019   Procedure: POLYPECTOMY;  Surgeon:  Corbin Ade, MD;  [facility];  Service: Endoscopy;;   VASECTOMY  late 1990's    Home Medications:  Allergies as of 08/12/2022       Reactions   Tape Other (See Comments)   Adhesive Addison Bailey        Medication List        Accurate as of Aug 12, 2022 10:17 AM. If you have any questions, ask your nurse or doctor.          allopurinol 300 MG tablet Commonly known as: ZYLOPRIM Take 1 tablet (300 mg total) by mouth daily.   amLODipine 5 MG tablet Commonly known as: NORVASC Take 5 mg by mouth daily.   blood glucose meter kit and supplies Kit Dispense based on patient and insurance preference. Use daily as directed. (FOR ICD-10 code E11.9)   CHLORZOXAZONE PO Take by mouth as needed.   enalapril 20 MG tablet Commonly known as: VASOTEC Take 20 mg by mouth every evening.   esomeprazole 40 MG capsule Commonly known as: NEXIUM TAKE 1  CAPSULE DAILY BEFOREBREAKFAST   finasteride 5 MG tablet Commonly known as: PROSCAR Take 1 tablet (5 mg total) by mouth daily.   fluticasone 50 MCG/ACT nasal spray Commonly known as: FLONASE Place 2 sprays into both nostrils daily.   glucose blood test strip Use to test blood sugar once daily. Use as instructed. One touch, verio strips. Dx:E11.9   metFORMIN 500 MG 24 hr tablet Commonly known as: Glucophage XR Take 1 tablet (500 mg total) by mouth daily with breakfast.   metronidazole 1 % cream Commonly known as: NORITATE Apply 1 Application topically every other day.   ondansetron 4 MG tablet Commonly known as: ZOFRAN Take 1 tablet (4 mg total) by mouth every 6 (six) hours as needed for nausea.        Allergies:  Allergies  Allergen Reactions   Tape Other (See Comments)    Adhesive Addison Bailey    Family History: Family History  Problem Relation Age of Onset   Hypertension Mother    Pulmonary disease Father    Cirrhosis Maternal Grandfather        unknown etiology, non-alcoholic   Liver cancer Maternal  Grandfather    Cirrhosis Cousin        unknown etiology, non-alcoholic   Liver cancer Cousin    Cirrhosis Cousin        alcoholic   Colon cancer Neg Hx     Social History:  reports that he quit smoking about 23 years ago. His smoking use included cigarettes. He has a 52.00 pack-year smoking history. He quit smokeless tobacco use about 24 years ago. He reports that he does not drink alcohol and does not use drugs.  ROS: All other review of systems were reviewed and are negative except what is noted above in HPI  Physical Exam: BP (!) 144/79   Pulse 84   Constitutional:  Alert and oriented, No acute distress. HEENT: [place] AT, moist mucus membranes.  Trachea midline, no masses. Cardiovascular: No clubbing, cyanosis, or edema. Respiratory: Normal respiratory effort, no increased work of breathing. GI: Abdomen is soft, nontender, nondistended, no abdominal masses GU: No CVA tenderness.  Lymph: No cervical or inguinal lymphadenopathy. Skin: No rashes, bruises or suspicious lesions. Neurologic: Grossly intact, no focal deficits, moving all 4 extremities. Psychiatric: Normal mood and affect.  Laboratory Data: Lab Results  Component Value Date   WBC 12.1 (H) 06/09/2022   HGB 12.0 (L) 06/09/2022   HCT 37.1 (L) 06/09/2022   MCV 99.7 06/09/2022   PLT 149 (L) 06/09/2022    Lab Results  Component Value Date   CREATININE 1.31 (H) 06/09/2022    No results found for: "PSA"  No results found for: "TESTOSTERONE"  Lab Results  Component Value Date   HGBA1C 7.3 (H) 06/08/2022    Urinalysis    Component Value Date/Time   COLORURINE YELLOW 06/08/2022 2012   APPEARANCEUR HAZY (A) 06/08/2022 2012   LABSPEC 1.012 06/08/2022 2012   PHURINE 5.0 06/08/2022 2012   GLUCOSEU NEGATIVE 06/08/2022 2012   HGBUR MODERATE (A) 06/08/2022 2012   BILIRUBINUR NEGATIVE 06/08/2022 2012   KETONESUR NEGATIVE 06/08/2022 2012   PROTEINUR 100 (A) 06/08/2022 2012   NITRITE NEGATIVE 06/08/2022 2012    LEUKOCYTESUR LARGE (A) 06/08/2022 2012    Lab Results  Component Value Date   LABMICR <3.0 07/29/2020   BACTERIA RARE (A) 06/08/2022    Pertinent Imaging:  Results for orders placed during the hospital encounter of 07/05/06  DG Abd 1 View  Narrative Clinical Data:  Left greater than right flank pain. History of kidney stones and cholecystectomy. ABDOMEN - 1 VIEW: Comparison: Lumbar spine radiographs 12/03/03. Findings: No calculi are seen over the kidneys or expected course of the ureters. There is a moderate amount of stool in the rectum. The bowel gas pattern appears nonobstructive. There are right upper quadrant surgical clips compatible with prior cholecystectomy. Mild lumbar spine degenerative changes are seen.  Impression No radiographic evidence of urinary tract calculus. If there is persistent clinical concern of a ureteral calculus, further evaluation with noncontrast CT should be considered.  Provider: Jodelle Red, Amy Bullington  No results found for this or any previous visit.  No results found for this or any previous visit.  No results found for this or any previous visit.  No results found for this or any previous visit.  No valid procedures specified. No results found for this or any previous visit.  No results found for this or any previous visit.   Assessment & Plan:    1. History of kidney stones -indapamide 2.5mg  daily -followup 3 months with [facility] - Urinalysis, Routine w reflex microscopic  2. Benign prostatic hyperplasia with urinary obstruction PSA today.    No follow-ups on file.  Wilkie Aye, MD  [facility] [place]

## 2022-08-13 LAB — PSA: Prostate Specific Ag, Serum: 0.3 ng/mL (ref 0.0–4.0)

## 2022-08-17 DIAGNOSIS — L719 Rosacea, unspecified: Secondary | ICD-10-CM | POA: Diagnosis not present

## 2022-08-17 DIAGNOSIS — K219 Gastro-esophageal reflux disease without esophagitis: Secondary | ICD-10-CM | POA: Diagnosis not present

## 2022-08-17 DIAGNOSIS — I10 Essential (primary) hypertension: Secondary | ICD-10-CM | POA: Diagnosis not present

## 2022-08-17 DIAGNOSIS — I951 Orthostatic hypotension: Secondary | ICD-10-CM | POA: Diagnosis not present

## 2022-08-17 DIAGNOSIS — Z6833 Body mass index (BMI) 33.0-33.9, adult: Secondary | ICD-10-CM | POA: Diagnosis not present

## 2022-08-17 DIAGNOSIS — E875 Hyperkalemia: Secondary | ICD-10-CM | POA: Diagnosis not present

## 2022-08-17 DIAGNOSIS — N400 Enlarged prostate without lower urinary tract symptoms: Secondary | ICD-10-CM | POA: Diagnosis not present

## 2022-08-17 DIAGNOSIS — K760 Fatty (change of) liver, not elsewhere classified: Secondary | ICD-10-CM | POA: Diagnosis not present

## 2022-08-17 DIAGNOSIS — M109 Gout, unspecified: Secondary | ICD-10-CM | POA: Diagnosis not present

## 2022-08-17 DIAGNOSIS — E119 Type 2 diabetes mellitus without complications: Secondary | ICD-10-CM | POA: Diagnosis not present

## 2022-08-24 ENCOUNTER — Other Ambulatory Visit (HOSPITAL_COMMUNITY): Payer: PPO

## 2022-09-11 DIAGNOSIS — R7989 Other specified abnormal findings of blood chemistry: Secondary | ICD-10-CM | POA: Diagnosis not present

## 2022-09-14 NOTE — Patient Instructions (Signed)
Mark Leonard  09/14/2022     @PREFPERIOPPHARMACY @   Your procedure is scheduled on  09/17/2022.   Report to [facility] at  0830  A.M.   Call this number if you have problems the morning of surgery:  [phone]  If you experience any cold or flu symptoms such as cough, fever, chills, shortness of breath, etc. between now and your scheduled surgery, please notify us at the above number.   Remember:  Follow the diet and prep instructions given to you by the office.      DO NOT take any medications for diabetes the morning of your procedure.     Take these medicines the morning of surgery with A SIP OF WATER       allopurinol, amlodipine, chlorzoxazone, esomeprazole, proscar.    Do not wear jewelry, make-up or nail polish, including gel polish,  artificial nails, or any other type of covering on natural nails (fingers and  toes).  Do not wear lotions, powders, or perfumes, or deodorant.  Do not shave 48 hours prior to surgery.  Men may shave face and neck.  Do not bring valuables to the hospital.  [facility] is not responsible for any belongings or valuables.  Contacts, dentures or bridgework may not be worn into surgery.  Leave your suitcase in the car.  After surgery it may be brought to your room.  For patients admitted to the hospital, discharge time will be determined by your treatment team.  Patients discharged the day of surgery will not be allowed to drive home and must have someone with them for 24 hours.    Special instructions:   DO NOT smoke tobacco or vape for 24 hours before your procedure.  Please read over the following fact sheets that you were given. Anesthesia Post-op Instructions and Care and Recovery After Surgery      Upper Endoscopy, Adult, Care After After the procedure, it is common to have a sore throat. It is also common to have: Mild stomach pain or discomfort. Bloating. Nausea. Follow these instructions at home: The  instructions below may help you care for yourself at home. Your health care provider may give you more instructions. If you have questions, ask your health care provider. If you were given a sedative during the procedure, it can affect you for several hours. Do not drive or operate machinery until your health care provider says that it is safe. If you will be going home right after the procedure, plan to have a responsible adult: Take you home from the hospital or clinic. You will not be allowed to drive. Care for you for the time you are told. Follow instructions from your health care provider about what you may eat and drink. Return to your normal activities as told by your health care provider. Ask your health care provider what activities are safe for you. Take over-the-counter and prescription medicines only as told by your health care provider. Contact a health care provider if you: Have a sore throat that lasts longer than one day. Have trouble swallowing. Have a fever. Get help right away if you: Vomit blood or your vomit looks like coffee grounds. Have bloody, black, or tarry stools. Have a very bad sore throat or you cannot swallow. Have difficulty breathing or very bad pain in your chest or abdomen. These symptoms may be an emergency. Get help right away. Call 911. Do not wait to see if  the symptoms will go away. Do not drive yourself to the hospital. Summary After the procedure, it is common to have a sore throat, mild stomach discomfort, bloating, and nausea. If you were given a sedative during the procedure, it can affect you for several hours. Do not drive until your health care provider says that it is safe. Follow instructions from your health care provider about what you may eat and drink. Return to your normal activities as told by your health care provider. This information is not intended to replace advice given to you by your health care provider. Make sure you discuss  any questions you have with your health care provider. Document Revised: 07/02/2021 Document Reviewed: 07/02/2021 Elsevier Patient Education  2024 [employer] Colonoscopy, Adult, Care After The following information offers guidance on how to care for yourself after your procedure. Your health care provider may also give you more specific instructions. If you have problems or questions, contact your health care provider. What can I expect after the procedure? After the procedure, it is common to have: A small amount of blood in your stool for 24 hours after the procedure. Some gas. Mild cramping or bloating of your abdomen. Follow these instructions at home: Eating and drinking  Drink enough fluid to keep your urine pale yellow. Follow instructions from your health care provider about eating or drinking restrictions. Resume your normal diet as told by your health care provider. Avoid heavy or fried foods that are hard to digest. Activity Rest as told by your health care provider. Avoid sitting for a long time without moving. Get up to take short walks every 1-2 hours. This is important to improve blood flow and breathing. Ask for help if you feel weak or unsteady. Return to your normal activities as told by your health care provider. Ask your health care provider what activities are safe for you. Managing cramping and bloating  Try walking around when you have cramps or feel bloated. If directed, apply heat to your abdomen as told by your health care provider. Use the heat source that your health care provider recommends, such as a moist heat pack or a heating pad. Place a towel between your skin and the heat source. Leave the heat on for 20-30 minutes. Remove the heat if your skin turns bright red. This is especially important if you are unable to feel pain, heat, or cold. You have a greater risk of getting burned. General instructions If you were given a sedative during the procedure,  it can affect you for several hours. Do not drive or operate machinery until your health care provider says that it is safe. For the first 24 hours after the procedure: Do not sign important documents. Do not drink alcohol. Do your regular daily activities at a slower pace than normal. Eat soft foods that are easy to digest. Take over-the-counter and prescription medicines only as told by your health care provider. Keep all follow-up visits. This is important. Contact a health care provider if: You have blood in your stool 2-3 days after the procedure. Get help right away if: You have more than a small spotting of blood in your stool. You have large blood clots in your stool. You have swelling of your abdomen. You have nausea or vomiting. You have a fever. You have increasing pain in your abdomen that is not relieved with medicine. These symptoms may be an emergency. Get help right away. Call 911. Do not wait to see if  the symptoms will go away. Do not drive yourself to the hospital. Summary After the procedure, it is common to have a small amount of blood in your stool. You may also have mild cramping and bloating of your abdomen. If you were given a sedative during the procedure, it can affect you for several hours. Do not drive or operate machinery until your health care provider says that it is safe. Get help right away if you have a lot of blood in your stool, nausea or vomiting, a fever, or increased pain in your abdomen. This information is not intended to replace advice given to you by your health care provider. Make sure you discuss any questions you have with your health care provider. Document Revised: 05/05/2022 Document Reviewed: 11/13/2020 Elsevier Patient Education  2024 [employer] Inc. Monitored Anesthesia Care, Care After The following information offers guidance on how to care for yourself after your procedure. Your health care provider may also give you more specific  instructions. If you have problems or questions, contact your health care provider. What can I expect after the procedure? After the procedure, it is common to have: Tiredness. Little or no memory about what happened during or after the procedure. Impaired judgment when it comes to making decisions. Nausea or vomiting. Some trouble with balance. Follow these instructions at home: For the time period you were told by your health care provider:  Rest. Do not participate in activities where you could fall or become injured. Do not drive or use machinery. Do not drink alcohol. Do not take sleeping pills or medicines that cause drowsiness. Do not make important decisions or sign legal documents. Do not take care of children on your own. Medicines Take over-the-counter and prescription medicines only as told by your health care provider. If you were prescribed antibiotics, take them as told by your health care provider. Do not stop using the antibiotic even if you start to feel better. Eating and drinking Follow instructions from your health care provider about what you may eat and drink. Drink enough fluid to keep your urine pale yellow. If you vomit: Drink clear fluids slowly and in small amounts as you are able. Clear fluids include water, ice chips, low-calorie sports drinks, and fruit juice that has water added to it (diluted fruit juice). Eat light and bland foods in small amounts as you are able. These foods include bananas, applesauce, rice, lean meats, toast, and crackers. General instructions  Have a responsible adult stay with you for the time you are told. It is important to have someone help care for you until you are awake and alert. If you have sleep apnea, surgery and some medicines can increase your risk for breathing problems. Follow instructions from your health care provider about wearing your sleep device: When you are sleeping. This includes during daytime naps. While  taking prescription pain medicines, sleeping medicines, or medicines that make you drowsy. Do not use any products that contain nicotine or tobacco. These products include cigarettes, chewing tobacco, and vaping devices, such as e-cigarettes. If you need help quitting, ask your health care provider. Contact a health care provider if: You feel nauseous or vomit every time you eat or drink. You feel light-headed. You are still sleepy or having trouble with balance after 24 hours. You get a rash. You have a fever. You have redness or swelling around the IV site. Get help right away if: You have trouble breathing. You have new confusion after you get home.  These symptoms may be an emergency. Get help right away. Call 911. Do not wait to see if the symptoms will go away. Do not drive yourself to the hospital. This information is not intended to replace advice given to you by your health care provider. Make sure you discuss any questions you have with your health care provider. Document Revised: 08/18/2021 Document Reviewed: 08/18/2021 Elsevier Patient Education  2024 [employer].

## 2022-09-15 ENCOUNTER — Encounter (HOSPITAL_COMMUNITY)
Admission: RE | Admit: 2022-09-15 | Discharge: 2022-09-15 | Disposition: A | Discharge status: Home / Self Care | Payer: PPO | Source: Ambulatory Visit | Attending: Internal Medicine | Admitting: Internal Medicine

## 2022-09-15 ENCOUNTER — Encounter (HOSPITAL_COMMUNITY): Payer: Self-pay

## 2022-09-15 VITALS — BP 135/71 | HR 81 | Temp 98.3°F | Resp 18 | Ht 67.0 in | Wt 216.1 lb

## 2022-09-15 DIAGNOSIS — Z01818 Encounter for other preprocedural examination: Secondary | ICD-10-CM | POA: Insufficient documentation

## 2022-09-15 DIAGNOSIS — E119 Type 2 diabetes mellitus without complications: Secondary | ICD-10-CM | POA: Insufficient documentation

## 2022-09-15 DIAGNOSIS — I10 Essential (primary) hypertension: Secondary | ICD-10-CM

## 2022-09-15 DIAGNOSIS — K7460 Unspecified cirrhosis of liver: Secondary | ICD-10-CM | POA: Insufficient documentation

## 2022-09-15 LAB — CBC WITH DIFFERENTIAL/PLATELET
Abs Immature Granulocytes: 0.01 10*3/uL (ref 0.00–0.07)
Basophils Absolute: 0.1 10*3/uL (ref 0.0–0.1)
Basophils Relative: 1 %
Eosinophils Absolute: 0.1 10*3/uL (ref 0.0–0.5)
Eosinophils Relative: 2 %
HCT: 43.1 % (ref 39.0–52.0)
Hemoglobin: 14.1 g/dL (ref 13.0–17.0)
Immature Granulocytes: 0 %
Lymphocytes Relative: 27 %
Lymphs Abs: 1.5 10*3/uL (ref 0.7–4.0)
MCH: 32 pg (ref 26.0–34.0)
MCHC: 32.7 g/dL (ref 30.0–36.0)
MCV: 97.7 fL (ref 80.0–100.0)
Monocytes Absolute: 0.4 10*3/uL (ref 0.1–1.0)
Monocytes Relative: 8 %
Neutro Abs: 3.5 10*3/uL (ref 1.7–7.7)
Neutrophils Relative %: 62 %
Platelets: 125 10*3/uL — ABNORMAL LOW (ref 150–400)
RBC: 4.41 MIL/uL (ref 4.22–5.81)
RDW: 15.1 % (ref 11.5–15.5)
WBC: 5.7 10*3/uL (ref 4.0–10.5)
nRBC: 0 % (ref 0.0–0.2)

## 2022-09-15 LAB — PROTIME-INR
INR: 1.1 (ref 0.8–1.2)
Prothrombin Time: 14.4 seconds (ref 11.4–15.2)

## 2022-09-15 LAB — COMPREHENSIVE METABOLIC PANEL
ALT: 47 U/L — ABNORMAL HIGH (ref 0–44)
AST: 46 U/L — ABNORMAL HIGH (ref 15–41)
Albumin: 3.6 g/dL (ref 3.5–5.0)
Alkaline Phosphatase: 124 U/L (ref 38–126)
Anion gap: 9 (ref 5–15)
BUN: 26 mg/dL — ABNORMAL HIGH (ref 8–23)
CO2: 26 mmol/L (ref 22–32)
Calcium: 9.4 mg/dL (ref 8.9–10.3)
Chloride: 100 mmol/L (ref 98–111)
Creatinine, Ser: 1.28 mg/dL — ABNORMAL HIGH (ref 0.61–1.24)
GFR, Estimated: >60 mL/min (ref >60)
Glucose, Bld: 240 mg/dL — ABNORMAL HIGH (ref 70–99)
Potassium: 4.7 mmol/L (ref 3.5–5.1)
Sodium: 135 mmol/L (ref 135–145)
Total Bilirubin: 0.6 mg/dL (ref 0.3–1.2)
Total Protein: 7.7 g/dL (ref 6.5–8.1)

## 2022-09-17 ENCOUNTER — Ambulatory Visit (HOSPITAL_COMMUNITY)
Admission: RE | Admit: 2022-09-17 | Discharge: 2022-09-17 | Disposition: A | Discharge status: Home / Self Care | Payer: PPO | Source: Ambulatory Visit | Attending: Internal Medicine | Admitting: Internal Medicine

## 2022-09-17 ENCOUNTER — Encounter (HOSPITAL_COMMUNITY)
Admission: RE | Disposition: A | Discharge status: Home / Self Care | Payer: Self-pay | Source: Ambulatory Visit | Attending: Internal Medicine

## 2022-09-17 ENCOUNTER — Ambulatory Visit (HOSPITAL_BASED_OUTPATIENT_CLINIC_OR_DEPARTMENT_OTHER): Payer: PPO | Admitting: Anesthesiology

## 2022-09-17 ENCOUNTER — Ambulatory Visit (HOSPITAL_COMMUNITY): Payer: PPO | Admitting: Anesthesiology

## 2022-09-17 DIAGNOSIS — D120 Benign neoplasm of cecum: Secondary | ICD-10-CM | POA: Diagnosis not present

## 2022-09-17 DIAGNOSIS — D126 Benign neoplasm of colon, unspecified: Secondary | ICD-10-CM | POA: Diagnosis not present

## 2022-09-17 DIAGNOSIS — K5730 Diverticulosis of large intestine without perforation or abscess without bleeding: Secondary | ICD-10-CM

## 2022-09-17 DIAGNOSIS — I10 Essential (primary) hypertension: Secondary | ICD-10-CM

## 2022-09-17 DIAGNOSIS — K7460 Unspecified cirrhosis of liver: Secondary | ICD-10-CM

## 2022-09-17 DIAGNOSIS — K219 Gastro-esophageal reflux disease without esophagitis: Secondary | ICD-10-CM | POA: Diagnosis not present

## 2022-09-17 DIAGNOSIS — K3189 Other diseases of stomach and duodenum: Secondary | ICD-10-CM

## 2022-09-17 DIAGNOSIS — D123 Benign neoplasm of transverse colon: Secondary | ICD-10-CM | POA: Diagnosis not present

## 2022-09-17 DIAGNOSIS — Z7984 Long term (current) use of oral hypoglycemic drugs: Secondary | ICD-10-CM | POA: Diagnosis not present

## 2022-09-17 DIAGNOSIS — Z86010 Personal history of colonic polyps: Secondary | ICD-10-CM | POA: Diagnosis not present

## 2022-09-17 DIAGNOSIS — K766 Portal hypertension: Secondary | ICD-10-CM

## 2022-09-17 DIAGNOSIS — I8500 Esophageal varices without bleeding: Secondary | ICD-10-CM | POA: Diagnosis not present

## 2022-09-17 DIAGNOSIS — K640 First degree hemorrhoids: Secondary | ICD-10-CM | POA: Diagnosis not present

## 2022-09-17 DIAGNOSIS — I8510 Secondary esophageal varices without bleeding: Secondary | ICD-10-CM | POA: Insufficient documentation

## 2022-09-17 DIAGNOSIS — Z79899 Other long term (current) drug therapy: Secondary | ICD-10-CM | POA: Insufficient documentation

## 2022-09-17 DIAGNOSIS — Z87891 Personal history of nicotine dependence: Secondary | ICD-10-CM | POA: Diagnosis not present

## 2022-09-17 DIAGNOSIS — E119 Type 2 diabetes mellitus without complications: Secondary | ICD-10-CM | POA: Diagnosis not present

## 2022-09-17 DIAGNOSIS — K635 Polyp of colon: Secondary | ICD-10-CM | POA: Diagnosis not present

## 2022-09-17 DIAGNOSIS — Z1211 Encounter for screening for malignant neoplasm of colon: Secondary | ICD-10-CM

## 2022-09-17 HISTORY — PX: COLONOSCOPY WITH PROPOFOL: SHX5780

## 2022-09-17 HISTORY — PX: ESOPHAGOGASTRODUODENOSCOPY (EGD) WITH PROPOFOL: SHX5813

## 2022-09-17 HISTORY — PX: POLYPECTOMY: SHX5525

## 2022-09-17 LAB — GLUCOSE, CAPILLARY: Glucose-Capillary: 176 mg/dL — ABNORMAL HIGH (ref 70–99)

## 2022-09-17 SURGERY — COLONOSCOPY WITH PROPOFOL
Anesthesia: General

## 2022-09-17 MED ORDER — STERILE WATER FOR IRRIGATION IR SOLN
Status: DC | PRN
  Administered 2022-09-17: 120 mL

## 2022-09-17 MED ORDER — PHENYLEPHRINE HCL (PRESSORS) 10 MG/ML IV SOLN
INTRAVENOUS | Status: DC | PRN
  Administered 2022-09-17 (×3): 160 ug via INTRAVENOUS

## 2022-09-17 MED ORDER — LIDOCAINE HCL (CARDIAC) PF 100 MG/5ML IV SOSY
PREFILLED_SYRINGE | INTRAVENOUS | Status: DC | PRN
  Administered 2022-09-17: 60 mg via INTRAVENOUS

## 2022-09-17 MED ORDER — PROPOFOL 10 MG/ML IV BOLUS
INTRAVENOUS | Status: DC | PRN
  Administered 2022-09-17: 60 mg via INTRAVENOUS

## 2022-09-17 MED ORDER — LACTATED RINGERS IV SOLN: INTRAVENOUS | Status: DC

## 2022-09-17 MED ORDER — PHENYLEPHRINE 80 MCG/ML (10ML) SYRINGE FOR IV PUSH (FOR BLOOD PRESSURE SUPPORT)
PREFILLED_SYRINGE | INTRAVENOUS | Status: AC
  Filled 2022-09-17: qty 10

## 2022-09-17 MED ORDER — PROPOFOL 500 MG/50ML IV EMUL
INTRAVENOUS | Status: DC | PRN
  Administered 2022-09-17: 125 ug/kg/min via INTRAVENOUS

## 2022-09-17 MED ORDER — LIDOCAINE HCL (PF) 2 % IJ SOLN
INTRAMUSCULAR | Status: AC
  Filled 2022-09-17: qty 5

## 2022-09-17 NOTE — Op Note (Signed)
[facility] Patient Name: Mark Leonard Procedure Date: 09/17/2022 8:18 AM MRN: [id number] Date of Birth: 1950-07-06 Attending MD: Gennette Pac , MD, [id number] CSN: [id number] Age: 72 Admit Type: Outpatient Procedure:                Upper GI endoscopy Indications:              Cirrhosis rule out esophageal varices Providers:                Gennette Pac, MD, Sheran Fava,                            Zena Amos Referring MD:              Medicines:                Propofol per Anesthesia Complications:            No immediate complications. Estimated Blood Loss:     Estimated blood loss: none. Procedure:                Pre-Anesthesia Assessment:                           - Prior to the procedure, a History and Physical                            was performed, and patient medications and                            allergies were reviewed. The patient's tolerance of                            previous anesthesia was also reviewed. The risks                            and benefits of the procedure and the sedation                            options and risks were discussed with the patient.                            All questions were answered, and informed consent                            was obtained. Prior Anticoagulants: The patient has                            taken no anticoagulant or antiplatelet agents. ASA                            Grade Assessment: III - A patient with severe                            systemic disease. After reviewing the risks and  benefits, the patient was deemed in satisfactory                            condition to undergo the procedure.                           After obtaining informed consent, the endoscope was                            passed under direct vision. Throughout the                            procedure, the patient's blood pressure, pulse, and                            oxygen  saturations were monitored continuously. The                            [id number]-[id number] ([id number]) scope was introduced through the                            mouth, and advanced to the second part of duodenum.                            The upper GI endoscopy was accomplished without                            difficulty. The patient tolerated the procedure                            well. Scope In: 8:55:47 AM Scope Out: 9:00:26 AM Total Procedure Duration: 0 hours 4 minutes 39 seconds  Findings:      4 columns grade 1-2 esophageal varices with no bleeding stigmata.       Overlying esophageal mucosa appeared normal. Gastric cavity empty.      Moderate portal hypertensive gastropathy was found in the entire       examined stomach. No gastric varices. No infiltrating process or other       abnormality pylorus patent.      The duodenal bulb and second portion of the duodenum were normal. Impression:               - 4 columns grade 1-2 esophageal varices portal                            hypertensive gastropathy.                           - Normal duodenal bulb and second portion of the                            duodenum.                           - No specimens collected. Moderate Sedation:      Moderate (conscious) sedation was personally administered by  an       anesthesia professional. The following parameters were monitored: oxygen       saturation, heart rate, blood pressure, respiratory rate, EKG, adequacy       of pulmonary ventilation, and response to care. Recommendation:           - Patient has a contact number available for                            emergencies. The signs and symptoms of potential                            delayed complications were discussed with the                            patient. Return to normal activities tomorrow.                            Written discharge instructions were provided to the                            patient.                           -  Advance diet as tolerated.                           - Continue present medications. Given ongoing                            transaminitis, an indicator of ongoing hepatic                            parenchymal inflammation, we will likely offer the                            patient primary prophylaxis with nonselective                            beta-blocker when he returns for evaluation in 4                            weeks.                           - Return to my office in 4 weeks. See colonoscopy                            report. Procedure Code(s):        --- Professional ---                           [phone], Esophagogastroduodenoscopy, flexible,                            transoral; diagnostic, including collection of  specimen(s) by brushing or washing, when performed                            (separate procedure) Diagnosis Code(s):        --- Professional ---                           K76.6, Portal hypertension                           K31.89, Other diseases of stomach and duodenum                           K74.60, Unspecified cirrhosis of liver CPT copyright 2022 American Medical Association. All rights reserved. The codes documented in this report are preliminary and upon coder review may  be revised to meet current compliance requirements. Mark Friends. Damoni Causby, MD Gennette Pac, MD 09/17/2022 9:25:32 AM This report has been signed electronically. Number of Addenda: 0

## 2022-09-17 NOTE — Discharge Instructions (Addendum)
EGD Discharge instructions Please read the instructions outlined below and refer to this sheet in the next few weeks. These discharge instructions provide you with general information on caring for yourself after you leave the hospital. Your doctor may also give you specific instructions. While your treatment has been planned according to the most current medical practices available, unavoidable complications occasionally occur. If you have any problems or questions after discharge, please call your doctor. ACTIVITY You may resume your regular activity but move at a slower pace for the next 24 hours.  Take frequent rest periods for the next 24 hours.  Walking will help expel (get rid of) the air and reduce the bloated feeling in your abdomen.  No driving for 24 hours (because of the anesthesia (medicine) used during the test).  You may shower.  Do not sign any important legal documents or operate any machinery for 24 hours (because of the anesthesia used during the test).  NUTRITION Drink plenty of fluids.  You may resume your normal diet.  Begin with a light meal and progress to your normal diet.  Avoid alcoholic beverages for 24 hours or as instructed by your caregiver.  MEDICATIONS You may resume your normal medications unless your caregiver tells you otherwise.  WHAT YOU CAN EXPECT TODAY You may experience abdominal discomfort such as a feeling of fullness or "gas" pains.  FOLLOW-UP Your doctor will discuss the results of your test with you.  SEEK IMMEDIATE MEDICAL ATTENTION IF ANY OF THE FOLLOWING OCCUR: Excessive nausea (feeling sick to your stomach) and/or vomiting.  Severe abdominal pain and distention (swelling).  Trouble swallowing.  Temperature over 101 F (37.8 C).  Rectal bleeding or vomiting of blood.   The risks, benefits, limitations, alternatives and imponderables have been reviewed with the patient. Potential for esophageal dilation, biopsy, etc. have also been reviewed.   Questions have been answered. All parties agreeable.    You do have a small varicose veins in your esophagus.  You had 6 polyps in your colon I removed  Further recommendations to follow pending review of pathology report  Office visit in 4 weeks with Ermalinda Memos to discuss further management of esophageal varices.  At patient request, called Abdiel Blackerby at [phone] -rolled to voicemail.  Left a message.

## 2022-09-17 NOTE — Transfer of Care (Addendum)
Immediate Anesthesia Transfer of Care Note  Patient: Mark Leonard  Procedure(s) Performed: COLONOSCOPY WITH PROPOFOL ESOPHAGOGASTRODUODENOSCOPY (EGD) WITH PROPOFOL POLYPECTOMY  Patient Location: PACU and Short Stay  Anesthesia Type:General  Level of Consciousness: drowsy and patient cooperative  Airway & Oxygen Therapy: Patient Spontanous Breathing and Patient connected to nasal cannula oxygen  Post-op Assessment: Report given to RN and Post -op Vital signs reviewed and stable  Post vital signs: Reviewed and stable  Last Vitals:  Vitals Value Taken Time  BP 104/51 09/17/2022    0928  Temp 36.7 09/17/2022    0928  Pulse 70 09/17/2022    0928  Resp 16 09/17/2022    0928  SpO2 94% 09/17/2022    0928    Last Pain:  Vitals:   09/17/22 0851  PainSc: 0-No pain         Complications: No notable events documented.

## 2022-09-17 NOTE — Anesthesia Postprocedure Evaluation (Signed)
Anesthesia Post Note  Patient: Mark Leonard  Procedure(s) Performed: COLONOSCOPY WITH PROPOFOL ESOPHAGOGASTRODUODENOSCOPY (EGD) WITH PROPOFOL POLYPECTOMY  Patient location during evaluation: Phase II Anesthesia Type: General Level of consciousness: awake and alert and oriented Pain management: pain level controlled Vital Signs Assessment: post-procedure vital signs reviewed and stable Respiratory status: spontaneous breathing, nonlabored ventilation and respiratory function stable Cardiovascular status: blood pressure returned to baseline and stable Postop Assessment: no apparent nausea or vomiting Anesthetic complications: no  No notable events documented.   Last Vitals:  Vitals:   09/17/22 0830 09/17/22 0928  BP: (!) 144/77 (!) 104/51  Pulse: 91 70  Resp: (!) 21 16  Temp:  36.7 C  SpO2: 97% 94%    Last Pain:  Vitals:   09/17/22 0928  TempSrc: Oral  PainSc: 0-No pain                 Heavin Sebree C Gerline Ratto

## 2022-09-17 NOTE — Anesthesia Preprocedure Evaluation (Signed)
Anesthesia Evaluation  Patient identified by MRN, date of birth, ID band Patient awake    Reviewed: Allergy & Precautions, H&P , NPO status , Patient's Chart, lab work & pertinent test results  Airway Mallampati: II  TM Distance: >3 FB Neck ROM: Full    Dental  (+) Dental Advisory Given, Missing   Pulmonary neg pulmonary ROS, former smoker   Pulmonary exam normal breath sounds clear to auscultation       Cardiovascular hypertension, Pt. on medications Normal cardiovascular exam+ Valvular Problems/Murmurs  Rhythm:Regular Rate:Normal     Neuro/Psych negative neurological ROS  negative psych ROS   GI/Hepatic ,GERD  Medicated and Controlled,,(+) Cirrhosis         Endo/Other  diabetes, Well Controlled, Type 2, Oral Hypoglycemic Agents    Renal/GU Renal InsufficiencyRenal disease  negative genitourinary   Musculoskeletal negative musculoskeletal ROS (+)    Abdominal   Peds negative pediatric ROS (+)  Hematology negative hematology ROS (+)   Anesthesia Other Findings   Reproductive/Obstetrics negative OB ROS                             Anesthesia Physical Anesthesia Plan  ASA: 2  Anesthesia Plan: General   Post-op Pain Management: Minimal or no pain anticipated   Induction: Intravenous  PONV Risk Score and Plan: 1 and Propofol infusion  Airway Management Planned: Nasal Cannula and Natural Airway  Additional Equipment:   Intra-op Plan:   Post-operative Plan:   Informed Consent: I have reviewed the patients History and Physical, chart, labs and discussed the procedure including the risks, benefits and alternatives for the proposed anesthesia with the patient or authorized representative who has indicated his/her understanding and acceptance.     Dental advisory given  Plan Discussed with: CRNA and Surgeon  Anesthesia Plan Comments:         Anesthesia Quick  Evaluation

## 2022-09-17 NOTE — H&P (Signed)
@LOGO @   Primary Care Physician:  Donetta Potts, MD Primary Gastroenterologist:  Dr. Jena Gauss  Pre-Procedure History & Physical: HPI:  Mark Leonard is a 72 y.o. male here for screening EGD for varices.  Newly diagnosed cirrhosis.  History of multiple adenomas removed his colon 2021; due for surveillance colonoscopy at this time.  Past Medical History:  Diagnosis Date   Acid reflux    Allergy    COVID 04/2019   stuffy head sinus problems sob fatigue x 2 weeks   Diabetes mellitus without complication (HCC)    Elevated LFTs    since 2013   Fatty liver    Gout    Heart murmur    mild no cardiologist for last 20 yrs    History of kidney stones    Hypertension    Mitral regurgitation    mild    Prostate hypertrophy    Venous stasis    legs brown no vein doctor   Wears glasses     Past Surgical History:  Procedure Laterality Date   CARDIOVASCULAR STRESS TEST     CHOLECYSTECTOMY  late 1990's   COLONOSCOPY     COLONOSCOPY N/A 05/17/2019   Surgeon: Corbin Ade, MD;  seven 4-6 mm polyps removed.  Pathology with tubular adenomas and 1 hyperplastic polyp.  Recommended 3-year repeat.   CYSTOSCOPY WITH LITHOLAPAXY N/A 04/12/2020   Procedure: CYSTOSCOPY WITH  BLADDER AND KIDNEY LITHOLAPAXY;  Surgeon: Sebastian Ache, MD;  Location: [facility];  Service: Urology;  Laterality: N/A;   CYSTOSCOPY WITH RETROGRADE PYELOGRAM, URETEROSCOPY AND STENT PLACEMENT Bilateral 04/12/2020   Procedure: CYSTOSCOPY WITH RETROGRADE PYELOGRAM, URETEROSCOPY AND STENT PLACEMENT;  Surgeon: Sebastian Ache, MD;  Location: [facility];  Service: Urology;  Laterality: Bilateral;   EXTRACORPOREAL SHOCK WAVE LITHOTRIPSY  last done 18 yrs ago   4-5 times   HOLMIUM LASER APPLICATION Bilateral 04/12/2020   Procedure: HOLMIUM LASER APPLICATION;  Surgeon: Sebastian Ache, MD;  Location: [facility];  Service: Urology;  Laterality: Bilateral;   KIDNEY STONE SURGERY   1970's done x 2   POLYPECTOMY  05/17/2019   Procedure: POLYPECTOMY;  Surgeon: Corbin Ade, MD;  [facility];  Service: Endoscopy;;   VASECTOMY  late 1990's    Prior to Admission medications   Medication Sig Start Date End Date Taking? Authorizing Provider  acetaminophen (TYLENOL) 650 MG CR tablet Take 650 mg by mouth every 8 (eight) hours as needed for pain.   Yes [provider]  allopurinol (ZYLOPRIM) 300 MG tablet Take 1 tablet (300 mg total) by mouth daily. 07/29/20  Yes Ladona Ridgel, Malena M, DO  amLODipine (NORVASC) 5 MG tablet Take 5 mg by mouth daily. 08/10/22  Yes [provider]  chlorzoxazone (PARAFON) 500 MG tablet Take by mouth as needed (Back spasm).   Yes [provider]  esomeprazole (NEXIUM) 40 MG capsule TAKE 1 CAPSULE DAILY BEFOREBREAKFAST 07/29/20  Yes Ladona Ridgel, Malena M, DO  finasteride (PROSCAR) 5 MG tablet Take 1 tablet (5 mg total) by mouth daily. 08/12/22  Yes McKenzie, Mardene Celeste, MD  ibuprofen (ADVIL) 200 MG tablet Take 400 mg by mouth every 6 (six) hours as needed for mild pain or moderate pain.   Yes [provider]  indapamide (LOZOL) 2.5 MG tablet Take 1 tablet (2.5 mg total) by mouth daily. 08/12/22  Yes McKenzie, Mardene Celeste, MD  metronidazole (NORITATE) 1 % cream Apply 1 Application topically every other day. face  Yes [provider]  blood glucose meter kit and supplies KIT Dispense based on patient and insurance preference. Use daily as directed. (FOR ICD-10 code E11.9) 07/06/17   Merlyn Albert, MD  fluticasone [facility]) 50 MCG/ACT nasal spray Place 2 sprays into both nostrils daily. Patient not taking: Reported on 07/08/2022 02/13/22   Leath-Warren, Sadie Haber, NP  glucose blood test strip Use to test blood sugar once daily. Use as instructed. One touch, verio strips. Dx:E11.9 01/29/20   Laroy Apple M, DO  metFORMIN (GLUCOPHAGE XR) 500 MG 24 hr tablet Take 1 tablet (500 mg total) by mouth daily with breakfast.  09/30/20   Ladona Ridgel, Malena M, DO  ondansetron (ZOFRAN) 4 MG tablet Take 1 tablet (4 mg total) by mouth every 6 (six) hours as needed for nausea. Patient not taking: Reported on 07/08/2022 06/09/22   Tyrone Nine, MD    Allergies as of 07/15/2022 - Review Complete 07/08/2022  Allergen Reaction Noted   Tape Other (See Comments) 06/22/2012    Family History  Problem Relation Age of Onset   Hypertension Mother    Pulmonary disease Father    Cirrhosis Maternal Grandfather        unknown etiology, non-alcoholic   Liver cancer Maternal Grandfather    Cirrhosis Cousin        unknown etiology, non-alcoholic   Liver cancer Cousin    Cirrhosis Cousin        alcoholic   Colon cancer Neg Hx     Social History   Socioeconomic History   Marital status: Married    Spouse name: Not on file   Number of children: Not on file   Years of education: Not on file   Highest education level: Not on file  Occupational History   Not on file  Tobacco Use   Smoking status: Former    Packs/day: 2.00    Years: 26.00    Additional pack years: 0.00    Total pack years: 52.00    Types: Cigarettes    Quit date: 04/04/1999    Years since quitting: 23.4   Smokeless tobacco: Former    Quit date: 05/26/1998  Vaping Use   Vaping Use: Never used  Substance and Sexual Activity   Alcohol use: No    Alcohol/week: 0.0 standard drinks of alcohol   Drug use: No   Sexual activity: Not on file  Other Topics Concern   Not on file  Social History Narrative   Not on file   Social Determinants of Health   Financial Resource Strain: Not on file  Food Insecurity: No Food Insecurity (06/09/2022)   Hunger Vital Sign    Worried About Running Out of Food in the Last Year: Never true    Ran Out of Food in the Last Year: Never true  Transportation Needs: No Transportation Needs (06/09/2022)   PRAPARE - Administrator, Civil Service (Medical): No    Lack of Transportation (Non-Medical): No  Physical Activity:  Not on file  Stress: Not on file  Social Connections: Not on file  Intimate Partner Violence: Not At Risk (06/09/2022)   Humiliation, Afraid, Rape, and Kick questionnaire    Fear of Current or Ex-Partner: No    Emotionally Abused: No    Physically Abused: No    Sexually Abused: No    Review of Systems: See HPI, otherwise negative ROS  Physical Exam: There were no vitals taken for this visit. General:   Alert,  Well-developed,  well-nourished, pleasant and cooperative in NAD Neck:  Supple; no masses or thyromegaly. No significant cervical adenopathy. Lungs:  Clear throughout to auscultation.   No wheezes, crackles, or rhonchi. No acute distress. Heart:  Regular rate and rhythm; no murmurs, clicks, rubs,  or gallops. Abdomen: Non-distended, normal bowel sounds.  Soft and nontender without appreciable mass or hepatosplenomegaly.  Pulses:  Normal pulses noted. Extremities:  Without clubbing or edema.  Impression/Plan: 72 year old gentleman here with newly diagnosed cirrhosis.  Screening EGD today to check for varices.  Also, 7 polyps removed his colon in 2021; due for surveillance colonoscopy now.  I have offered the patient an EGD and colonoscopy today per plan. The risks, benefits, limitations, imponderables and alternatives regarding both EGD and colonoscopy have been reviewed with the patient. Questions have been answered. All parties agreeable.       Notice: This dictation was prepared with Dragon dictation along with smaller phrase technology. Any transcriptional errors that result from this process are unintentional and may not be corrected upon review.

## 2022-09-17 NOTE — Op Note (Signed)
[facility] Patient Name: Mark Leonard Procedure Date: 09/17/2022 8:18 AM MRN: [id number] Date of Birth: November 02, 1950 Attending MD: Gennette Pac , MD, [id number] CSN: [id number] Age: 72 Admit Type: Outpatient Procedure:                Colonoscopy Indications:              High risk colon cancer surveillance: Personal                            history of colonic polyps Providers:                Gennette Pac, MD, Sheran Fava,                            Zena Amos Referring MD:              Medicines:                Propofol per Anesthesia Complications:            No immediate complications. Estimated Blood Loss:     Estimated blood loss was minimal. Procedure:                Pre-Anesthesia Assessment:                           - Prior to the procedure, a History and Physical                            was performed, and patient medications and                            allergies were reviewed. The patient's tolerance of                            previous anesthesia was also reviewed. The risks                            and benefits of the procedure and the sedation                            options and risks were discussed with the patient.                            All questions were answered, and informed consent                            was obtained. Prior Anticoagulants: The patient has                            taken no anticoagulant or antiplatelet agents. ASA                            Grade Assessment: III - A patient with severe  systemic disease. After reviewing the risks and                            benefits, the patient was deemed in satisfactory                            condition to undergo the procedure.                           After obtaining informed consent, the colonoscope                            was passed under direct vision. Throughout the                            procedure, the  patient's blood pressure, pulse, and                            oxygen saturations were monitored continuously. The                            [id number]) scope was introduced through the                            anus and advanced to the the cecum, identified by                            appendiceal orifice and ileocecal valve. The                            colonoscopy was performed without difficulty. The                            patient tolerated the procedure well. The quality                            of the bowel preparation was adequate. The entire                            colon was well visualized. Scope In: 9:05:33 AM Scope Out: 9:22:13 AM Scope Withdrawal Time: 0 hours 12 minutes 24 seconds  Total Procedure Duration: 0 hours 16 minutes 40 seconds  Findings:      The perianal and digital rectal examinations were normal.      Six semi-pedunculated polyps were found in the hepatic flexure and       ileocecal valve. The polyps were 4 to 6 mm in size. These polyps were       removed with a cold snare. Resection and retrieval were complete.       Estimated blood loss was minimal.      Scattered small-mouthed diverticula were found in the sigmoid colon and       descending colon.      Non-bleeding internal hemorrhoids were found during retroflexion. The       hemorrhoids were moderate, medium-sized and Grade I (internal  hemorrhoids that do not prolapse).      The exam was otherwise without abnormality on direct and retroflexion       views. Impression:               - Six 4 to 6 mm polyps at the hepatic flexure and                            at the ileocecal valve, removed with a cold snare.                            Resected and retrieved.                           - Diverticulosis in the sigmoid colon and in the                            descending colon.                           - Non-bleeding internal hemorrhoids. Anal papilla                             also present.                           - The examination was otherwise normal on direct                            and retroflexion views. Moderate Sedation:      Moderate (conscious) sedation was personally administered by an       anesthesia professional. The following parameters were monitored: oxygen       saturation, heart rate, blood pressure, respiratory rate, EKG, adequacy       of pulmonary ventilation, and response to care. Recommendation:           - Patient has a contact number available for                            emergencies. The signs and symptoms of potential                            delayed complications were discussed with the                            patient. Return to normal activities tomorrow.                            Written discharge instructions were provided to the                            patient.                           - Resume previous diet.                           -  Continue present medications.                           - Repeat colonoscopy date to be determined after                            pending pathology results are reviewed for                            surveillance.                           - Return to GI office in 4 weeks. See colonoscopy                            report see EGD report. Procedure Code(s):        --- Professional ---                           [phone], Colonoscopy, flexible; with removal of                            tumor(s), polyp(s), or other lesion(s) by snare                            technique Diagnosis Code(s):        --- Professional ---                           Z86.010, Personal history of colonic polyps                           D12.3, Benign neoplasm of transverse colon (hepatic                            flexure or splenic flexure)                           D12.0, Benign neoplasm of cecum                           K64.0, First degree hemorrhoids                           K57.30, Diverticulosis of  large intestine without                            perforation or abscess without bleeding CPT copyright 2022 American [employer] Association. All rights reserved. The codes documented in this report are preliminary and upon coder review may  be revised to meet current compliance requirements. Gerrit Friends. Akaisha Truman, MD Gennette Pac, MD 09/17/2022 9:29:11 AM This report has been signed electronically. Number of Addenda: 0

## 2022-09-18 LAB — SURGICAL PATHOLOGY

## 2022-09-23 ENCOUNTER — Encounter (HOSPITAL_COMMUNITY): Payer: Self-pay | Admitting: Internal Medicine

## 2022-09-24 ENCOUNTER — Encounter: Payer: Self-pay | Admitting: Internal Medicine

## 2022-10-10 NOTE — Progress Notes (Unsigned)
Referring Provider: Donetta Potts, MD Primary Care Physician:  Donetta Potts, MD Primary GI Physician: Dr. Jena Gauss  No chief complaint on file.   HPI:   Mark Leonard is a 72 y.o. male with history of type 2 diabetes, HTN, adenomatous colon polyps, GERD, chronically elevated liver enzymes since 2013, found to have cirrhosis in March 2024, presenting today for follow-up.   Prior workup for elevated LFTs: ANA, AMA, ASMA, negative.  IgM and IgG within normal limits.  Mild elevation of IgA at 568.  Hep C negative. No immunity to hepatitis B.  Immune to hepatitis A.  Iron panel within normal limits.  TSH normal. -Recommended hepatitis B vaccination.  [place] dated 09/17/2021 showing hepatomegaly (liver 20.4 cm) with steatosis, simple hepatic cyst measuring less than 1 cm, no worrisome masses or lesions, patent portal vein, normal spleen.   CT A/P without contrast 06/08/2022 that showed severe hepatic steatosis with morphologic changes suggesting cirrhosis    Last seen in our office 07/08/2022.  GERD was well-controlled on Nexium 40 mg daily.  No significant lower GI symptoms.  No symptoms of decompensated liver disease.  Recommended continuing current medications, check INR, RUQ ultrasound with elastography, complete hepatitis B vaccination, counseled on the importance of weight loss through diet and exercise, tight glycemic control.  He was also scheduled for surveillance colonoscopy.  INR was normal at 1.0.  Ultrasound with elastography with increased hepatic echogenicity right hepatic cyst measuring 1.1 cm, median K PA 18.4 which was highly suggestive of compensated advanced chronic liver disease.  Recommended adding EGD for colonoscopy.  Procedures 09/17/2022:  Colonoscopy: - Six 4 to 6 mm polyps at the hepatic flexure and at the ileocecal valve, removed with a cold snare. Resected and retrieved.  - Diverticulosis in the sigmoid colon and in the descending colon.  - Non- bleeding  internal hemorrhoids. Anal papilla also present. -Pathology with tubular adenomas and 1 sessile serrated polyp.  -Recommended 3-year surveillance.  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 - Normal duodenal bulb and second portion of the duodenum. - Dr. Jena Gauss stated, "Given ongoing transaminitis, an indicator of ongoing hepatic parenchymal inflammation, we will likely offer the patient primary prophylaxis with nonselective beta- blocker when he returns for evaluation in 4 weeks."  Today:   Cirrhosis:  Labs: 09/15/2022-glucose 240, creatinine 1.28, sodium 135, AST 46, ALT 47, alk phos 124, total bilirubin 0.6, albumin 3.6, INR 1.1, platelets 125 MELD 3.0: 11 on 09/15/22 [place]: Up to date 07/10/22 with right hepatic cyst.  AFP: 4.4 on 07/15/22. Hep A/B vaccination: Immune to Hep A. ? Hep B vaccination*** EGD: 09/17/22 with 4 columns of grade 1-2 esophageal varices and PHG. Dr. Jena Gauss recommended starting NSBB.  BB: Not currently *** Ascites/peripheral edema:  Diuretics:  Paracentesis: Never History of SBP: No Encephalopathy:   GI Bleeding:    GERD:   Past Medical History:  Diagnosis Date   Acid reflux    Allergy    COVID 04/2019   stuffy head sinus problems sob fatigue x 2 weeks   Diabetes mellitus without complication (HCC)    Elevated LFTs    since 2013   Fatty liver    Gout    Heart murmur    mild no cardiologist for last 20 yrs    History of kidney stones    Hypertension    Mitral regurgitation    mild    Prostate hypertrophy    Venous stasis  legs brown no vein doctor   Wears glasses     Past Surgical History:  Procedure Laterality Date   CARDIOVASCULAR STRESS TEST     CHOLECYSTECTOMY  late 1990's   COLONOSCOPY     COLONOSCOPY N/A 05/17/2019   Surgeon: Corbin Ade, MD;  seven 4-6 mm polyps removed.  Pathology with tubular adenomas and 1 hyperplastic polyp.  Recommended 3-year repeat.   COLONOSCOPY WITH PROPOFOL N/A  09/17/2022   Procedure: COLONOSCOPY WITH PROPOFOL;  Surgeon: Corbin Ade, MD;  [facility];  Service: Endoscopy;  Laterality: N/A;  8:15 am, ASA 3   CYSTOSCOPY WITH LITHOLAPAXY N/A 04/12/2020   Procedure: CYSTOSCOPY WITH  BLADDER AND KIDNEY LITHOLAPAXY;  Surgeon: Sebastian Ache, MD;  Location: [facility];  Service: Urology;  Laterality: N/A;   CYSTOSCOPY WITH RETROGRADE PYELOGRAM, URETEROSCOPY AND STENT PLACEMENT Bilateral 04/12/2020   Procedure: CYSTOSCOPY WITH RETROGRADE PYELOGRAM, URETEROSCOPY AND STENT PLACEMENT;  Surgeon: Sebastian Ache, MD;  Location: [facility];  Service: Urology;  Laterality: Bilateral;   ESOPHAGOGASTRODUODENOSCOPY (EGD) WITH PROPOFOL N/A 09/17/2022   Procedure: ESOPHAGOGASTRODUODENOSCOPY (EGD) WITH PROPOFOL;  Surgeon: Corbin Ade, MD;  [facility];  Service: Endoscopy;  Laterality: N/A;   EXTRACORPOREAL SHOCK WAVE LITHOTRIPSY  last done 18 yrs ago   4-5 times   HOLMIUM LASER APPLICATION Bilateral 04/12/2020   Procedure: HOLMIUM LASER APPLICATION;  Surgeon: Sebastian Ache, MD;  Location: [facility];  Service: Urology;  Laterality: Bilateral;   KIDNEY STONE SURGERY  1970's done x 2   POLYPECTOMY  05/17/2019   Procedure: POLYPECTOMY;  Surgeon: Corbin Ade, MD;  [facility];  Service: Endoscopy;;   POLYPECTOMY  09/17/2022   Procedure: POLYPECTOMY;  Surgeon: Corbin Ade, MD;  [facility];  Service: Endoscopy;;   VASECTOMY  late 1990's    Current Outpatient Medications  Medication Sig Dispense Refill   acetaminophen (TYLENOL) 650 MG CR tablet Take 650 mg by mouth every 8 (eight) hours as needed for pain.     allopurinol (ZYLOPRIM) 300 MG tablet Take 1 tablet (300 mg total) by mouth daily. 90 tablet 1   amLODipine (NORVASC) 5 MG tablet Take 5 mg by mouth daily.     blood glucose meter kit and supplies KIT Dispense based on patient and insurance preference. Use  daily as directed. (FOR ICD-10 code E11.9) 1 each 5   chlorzoxazone (PARAFON) 500 MG tablet Take by mouth as needed (Back spasm).     esomeprazole (NEXIUM) 40 MG capsule TAKE 1 CAPSULE DAILY BEFOREBREAKFAST 90 capsule 1   finasteride (PROSCAR) 5 MG tablet Take 1 tablet (5 mg total) by mouth daily. 90 tablet 3   fluticasone (FLONASE) 50 MCG/ACT nasal spray Place 2 sprays into both nostrils daily. (Patient not taking: Reported on 07/08/2022) 16 g 0   glucose blood test strip Use to test blood sugar once daily. Use as instructed. One touch, verio strips. Dx:E11.9 100 each 12   ibuprofen (ADVIL) 200 MG tablet Take 400 mg by mouth every 6 (six) hours as needed for mild pain or moderate pain.     indapamide (LOZOL) 2.5 MG tablet Take 1 tablet (2.5 mg total) by mouth daily. 30 tablet 11   metFORMIN (GLUCOPHAGE XR) 500 MG 24 hr tablet Take 1 tablet (500 mg total) by mouth daily with breakfast. 90 tablet 1   metronidazole (NORITATE) 1 % cream Apply 1 Application topically every other day. face  ondansetron (ZOFRAN) 4 MG tablet Take 1 tablet (4 mg total) by mouth every 6 (six) hours as needed for nausea. (Patient not taking: Reported on 07/08/2022) 20 tablet 0   No current facility-administered medications for this visit.    Allergies as of 10/12/2022 - Review Complete 09/17/2022  Allergen Reaction Noted   Tape Other (See Comments) 06/22/2012    Family History  Problem Relation Age of Onset   Hypertension Mother    Pulmonary disease Father    Cirrhosis Maternal Grandfather        unknown etiology, non-alcoholic   Liver cancer Maternal Grandfather    Cirrhosis Cousin        unknown etiology, non-alcoholic   Liver cancer Cousin    Cirrhosis Cousin        alcoholic   Colon cancer Neg Hx     Social History   Socioeconomic History   Marital status: Married    Spouse name: Not on file   Number of children: Not on file   Years of education: Not on file   Highest education level: Not on file   Occupational History   Not on file  Tobacco Use   Smoking status: Former    Packs/day: 2.00    Years: 26.00    Additional pack years: 0.00    Total pack years: 52.00    Types: Cigarettes    Quit date: 04/04/1999    Years since quitting: 23.5   Smokeless tobacco: Former    Quit date: 05/26/1998  Vaping Use   Vaping Use: Never used  Substance and Sexual Activity   Alcohol use: No    Alcohol/week: 0.0 standard drinks of alcohol   Drug use: No   Sexual activity: Not on file  Other Topics Concern   Not on file  Social History Narrative   Not on file   Social Determinants of Health   Financial Resource Strain: Not on file  Food Insecurity: No Food Insecurity (06/09/2022)   Hunger Vital Sign    Worried About Running Out of Food in the Last Year: Never true    Ran Out of Food in the Last Year: Never true  Transportation Needs: No Transportation Needs (06/09/2022)   PRAPARE - Administrator, Civil Service (Medical): No    Lack of Transportation (Non-Medical): No  Physical Activity: Not on file  Stress: Not on file  Social Connections: Not on file    Review of Systems: Gen: Denies fever, chills, anorexia. Denies fatigue, weakness, weight loss.  CV: Denies chest pain, palpitations, syncope, peripheral edema, and claudication. Resp: Denies dyspnea at rest, cough, wheezing, coughing up blood, and pleurisy. GI: Denies vomiting blood, jaundice, and fecal incontinence.   Denies dysphagia or odynophagia. Derm: Denies rash, itching, dry skin Psych: Denies depression, anxiety, memory loss, confusion. No homicidal or suicidal ideation.  Heme: Denies bruising, bleeding, and enlarged lymph nodes.  Physical Exam: There were no vitals taken for this visit. General:   Alert and oriented. No distress noted. Pleasant and cooperative.  Head:  Normocephalic and atraumatic. Eyes:  Conjuctiva clear without scleral icterus. Heart:  S1, S2 present without murmurs appreciated. Lungs:   Clear to auscultation bilaterally. No wheezes, rales, or rhonchi. No distress.  Abdomen:  +BS, soft, non-tender and non-distended. No rebound or guarding. No HSM or masses noted. Msk:  Symmetrical without gross deformities. Normal posture. Extremities:  Without edema. Neurologic:  Alert and  oriented x4 Psych:  Normal mood and affect.  Assessment:     Plan:  ***   Ermalinda Memos, PA-C [facility] 10/12/2022

## 2022-10-12 ENCOUNTER — Encounter: Payer: Self-pay | Admitting: Gastroenterology

## 2022-10-12 ENCOUNTER — Ambulatory Visit: Payer: PPO | Admitting: Gastroenterology

## 2022-10-12 VITALS — BP 100/67 | HR 80 | Temp 97.7°F | Ht 67.0 in | Wt 205.6 lb

## 2022-10-12 DIAGNOSIS — K7469 Other cirrhosis of liver: Secondary | ICD-10-CM | POA: Diagnosis not present

## 2022-10-12 DIAGNOSIS — K219 Gastro-esophageal reflux disease without esophagitis: Secondary | ICD-10-CM

## 2022-10-12 NOTE — Patient Instructions (Addendum)
As we discussed, I would like to start you on a nonselective beta-blocker called carvedilol to decrease the pressure in the blood vessels in your esophagus to prevent bleeding.  However, as your blood pressure is borderline low today, we will hold off on starting this medication.  When you have your follow-up appointment with your primary care doctor in 2 weeks, please discuss blood pressure management with him.  We may be able to stop amlodipine and replace it with carvedilol versus adding carvedilol to amlodipine. The dosing of  carvedilol would start with 3.125 mg twice daily. After monitoring your blood pressure for 1 week, we would likely increase the dose to 6.25 mg twice daily if tolerated. The goal is for your resting heart rate to be between 55 to 60 bpm.  However, this is provided that you tolerate the medication and that your systolic blood pressure (top number) remains greater than 90.  I recommend that you keep a daily log of your blood pressure over the next couple of weeks until you see your primary care doctor.  Congratulations on your weight loss!  Keep up the good work!  Nutrition recommendations:  High-protein diet from a primarily plant-based diet. Avoid red meat.  No raw or undercooked meat, seafood, or shellfish. Low-fat/cholesterol/carbohydrate diet. Limit sodium to no more than 2000 mg/day including everything that you eat and drink. Recommend at least 30 minutes of aerobic and resistance exercise 3 days/week.  Continue Nexium 40 mg daily.  Will plan to see you back in about 6 months. Please let me know what your primary care provider says about the blood pressure medications.

## 2022-10-21 DIAGNOSIS — E875 Hyperkalemia: Secondary | ICD-10-CM | POA: Diagnosis not present

## 2022-10-21 DIAGNOSIS — I10 Essential (primary) hypertension: Secondary | ICD-10-CM | POA: Diagnosis not present

## 2022-10-21 DIAGNOSIS — E1165 Type 2 diabetes mellitus with hyperglycemia: Secondary | ICD-10-CM | POA: Diagnosis not present

## 2022-10-22 ENCOUNTER — Ambulatory Visit
Admission: EM | Admit: 2022-10-22 | Discharge: 2022-10-22 | Disposition: A | Discharge status: Home / Self Care | Payer: PPO | Attending: Family Medicine | Admitting: Family Medicine

## 2022-10-22 DIAGNOSIS — J3089 Other allergic rhinitis: Secondary | ICD-10-CM | POA: Diagnosis not present

## 2022-10-22 DIAGNOSIS — H6122 Impacted cerumen, left ear: Secondary | ICD-10-CM

## 2022-10-22 MED ORDER — CETIRIZINE HCL 10 MG PO TABS: 10.0000 mg | ORAL_TABLET | Freq: Every day | ORAL | 2 refills | Status: AC

## 2022-10-22 NOTE — Discharge Instructions (Signed)
Take Zyrtec once daily and Flonase twice daily to keep seasonal allergy symptoms under control.  You may also use Coricidin HBP or other decongestants as needed for symptoms.  Regarding your earwax impaction, you may use Debrox drops and warm water either from the shower or a bulb syringe anytime you feel like wax is accumulating

## 2022-10-22 NOTE — ED Provider Notes (Signed)
[facility]    CSN: [id number] Arrival date & time: 10/22/22  3329      History   Chief Complaint No chief complaint on file.   HPI Mark Leonard is a 72 y.o. male.   Patient presenting today with about a week of earwax buildup in the left ear, throat drainage, sinus drainage, ear pressure.  Denies cough, chest pain, shortness of breath, abdominal pain, nausea vomiting or diarrhea.  History of seasonal allergies not currently on anything for this.  Trying ibuprofen with minimal relief and has tried to rinse his ears out at home with no progress.    Past Medical History:  Diagnosis Date   Acid reflux    Allergy    Cirrhosis (HCC)    Natural immunity to hepatitis A.  Completed hepatitis B vaccination May 2024.   COVID 04/2019   stuffy head sinus problems sob fatigue x 2 weeks   Diabetes mellitus without complication (HCC)    Elevated LFTs    since 2013   Fatty liver    Gout    Heart murmur    mild no cardiologist for last 20 yrs    History of kidney stones    Hypertension    Mitral regurgitation    mild    Prostate hypertrophy    Venous stasis    legs brown no vein doctor   Wears glasses     Patient Active Problem List   Diagnosis Date Noted   UTI (urinary tract infection) 06/08/2022   Left ureteral stone 06/08/2022   AKI (acute kidney injury) (HCC) 06/08/2022   Elevated LFTs 10/13/2021   Diabetes (HCC) 05/27/2013   Esophageal reflux 09/21/2012   Gout 09/21/2012   Fatty liver 09/21/2012   Prostate hypertrophy 09/21/2012   Venous stasis 09/21/2012   Essential hypertension, benign 06/22/2012    Past Surgical History:  Procedure Laterality Date   CARDIOVASCULAR STRESS TEST     CHOLECYSTECTOMY  late 1990's   COLONOSCOPY     COLONOSCOPY N/A 05/17/2019   Surgeon: Corbin Ade, MD;  seven 4-6 mm polyps removed.  Pathology with tubular adenomas and 1 hyperplastic polyp.  Recommended 3-year repeat.   COLONOSCOPY WITH PROPOFOL N/A 09/17/2022    Surgeon: Corbin Ade, MD; Six 4-6 mm polyps removed, sigmoid and descending colon diverticulosis, nonbleeding internal hemorrhoids, anal papilla.  Pathology with tubular adenomas and 1 sessile serrated polyp.  Recommended 3 year surveillance.   CYSTOSCOPY WITH LITHOLAPAXY N/A 04/12/2020   Procedure: CYSTOSCOPY WITH  BLADDER AND KIDNEY LITHOLAPAXY;  Surgeon: Sebastian Ache, MD;  Location: [facility];  Service: Urology;  Laterality: N/A;   CYSTOSCOPY WITH RETROGRADE PYELOGRAM, URETEROSCOPY AND STENT PLACEMENT Bilateral 04/12/2020   Procedure: CYSTOSCOPY WITH RETROGRADE PYELOGRAM, URETEROSCOPY AND STENT PLACEMENT;  Surgeon: Sebastian Ache, MD;  Location: [facility];  Service: Urology;  Laterality: Bilateral;   ESOPHAGOGASTRODUODENOSCOPY (EGD) WITH PROPOFOL N/A 09/17/2022   Surgeon: Corbin Ade, MD; 4 columns of grade 1-2 esophageal varices, portal hypertensive gastropathy.   EXTRACORPOREAL SHOCK WAVE LITHOTRIPSY  last done 18 yrs ago   4-5 times   HOLMIUM LASER APPLICATION Bilateral 04/12/2020   Procedure: HOLMIUM LASER APPLICATION;  Surgeon: Sebastian Ache, MD;  Location: [facility];  Service: Urology;  Laterality: Bilateral;   KIDNEY STONE SURGERY  1970's done x 2   POLYPECTOMY  05/17/2019   Procedure: POLYPECTOMY;  Surgeon: Corbin Ade, MD;  [facility];  Service: Endoscopy;;   POLYPECTOMY  09/17/2022   Procedure: POLYPECTOMY;  Surgeon: Corbin Ade, MD;  [facility];  Service: Endoscopy;;   VASECTOMY  late 1990's       Home Medications    Prior to Admission medications   Medication Sig Start Date End Date Taking? Authorizing Provider  cetirizine (ZYRTEC ALLERGY) 10 MG tablet Take 1 tablet (10 mg total) by mouth daily. 10/22/22  Yes Particia Nearing, PA-C  acetaminophen (TYLENOL) 650 MG CR tablet Take 650 mg by mouth every 8 (eight) hours as needed for pain.    [provider]   allopurinol (ZYLOPRIM) 300 MG tablet Take 1 tablet (300 mg total) by mouth daily. 07/29/20   Ladona Ridgel, Malena M, DO  amLODipine (NORVASC) 5 MG tablet Take 5 mg by mouth daily. 08/10/22   [provider]  blood glucose meter kit and supplies KIT Dispense based on patient and insurance preference. Use daily as directed. (FOR ICD-10 code E11.9) 07/06/17   Merlyn Albert, MD  chlorzoxazone (PARAFON) 500 MG tablet Take by mouth as needed (Back spasm).    [provider]  esomeprazole (NEXIUM) 40 MG capsule TAKE 1 CAPSULE DAILY BEFOREBREAKFAST 07/29/20   Ladona Ridgel, Malena M, DO  finasteride (PROSCAR) 5 MG tablet Take 1 tablet (5 mg total) by mouth daily. 08/12/22   McKenzie, Mardene Celeste, MD  fluticasone (FLONASE) 50 MCG/ACT nasal spray Place 2 sprays into both nostrils daily. 02/13/22   Leath-Warren, Sadie Haber, NP  glucose blood test strip Use to test blood sugar once daily. Use as instructed. One touch, verio strips. Dx:E11.9 01/29/20   Laroy Apple M, DO  ibuprofen (ADVIL) 200 MG tablet Take 400 mg by mouth every 6 (six) hours as needed for mild pain or moderate pain.    [provider]  indapamide (LOZOL) 2.5 MG tablet Take 1 tablet (2.5 mg total) by mouth daily. 08/12/22   McKenzie, Mardene Celeste, MD  metronidazole (NORITATE) 1 % cream Apply 1 Application topically every other day. face    [provider]    Family History Family History  Problem Relation Age of Onset   Hypertension Mother    Pulmonary disease Father    Cirrhosis Maternal Grandfather        unknown etiology, non-alcoholic   Liver cancer Maternal Grandfather    Cirrhosis Cousin        unknown etiology, non-alcoholic   Liver cancer Cousin    Cirrhosis Cousin        alcoholic   Colon cancer Neg Hx     Social History Social History   Tobacco Use   Smoking status: Former    Current packs/day: 0.00    Average packs/day: 2.0 packs/day for 26.0 years (52.0 ttl pk-yrs)    Types: Cigarettes    Start  date: 04/03/1973    Quit date: 04/04/1999    Years since quitting: 23.5   Smokeless tobacco: Former    Quit date: 05/26/1998  Vaping Use   Vaping status: Never Used  Substance Use Topics   Alcohol use: No    Alcohol/week: 0.0 standard drinks of alcohol   Drug use: No     Allergies   Tape   Review of Systems Review of Systems Per HPI  Physical Exam Triage Vital Signs ED Triage Vitals [10/22/22 0830]  Encounter Vitals Group     BP 139/77     Systolic BP Percentile      Diastolic BP Percentile      Pulse Rate 76  Resp 18     Temp 98.3 F (36.8 C)     Temp Source Oral     SpO2 93 %     Weight      Height      Head Circumference      Peak Flow      Pain Score 4     Pain Loc      Pain Education      Exclude from Growth Chart    No data found.  Updated Vital Signs BP 139/77 (BP Location: Right Arm)   Pulse 76   Temp 98.3 F (36.8 C) (Oral)   Resp 18   SpO2 93%   Visual Acuity Right Eye Distance:   Left Eye Distance:   Bilateral Distance:    Right Eye Near:   Left Eye Near:    Bilateral Near:     Physical Exam Vitals and nursing note reviewed.  Constitutional:      Appearance: He is well-developed.  HENT:     Head: Atraumatic.     Right Ear: External ear normal.     Left Ear: External ear normal. There is impacted cerumen.     Ears:     Comments: Mild middle ear effusions bilaterally.  Left TM with wax impaction, post lavage TM visualized and benign with no wax debris remaining    Nose: Rhinorrhea present.     Mouth/Throat:     Pharynx: Posterior oropharyngeal erythema present. No oropharyngeal exudate.  Eyes:     Conjunctiva/sclera: Conjunctivae normal.     Pupils: Pupils are equal, round, and reactive to light.  Cardiovascular:     Rate and Rhythm: Normal rate and regular rhythm.  Pulmonary:     Effort: Pulmonary effort is normal. No respiratory distress.     Breath sounds: No wheezing or rales.  Musculoskeletal:        General: Normal  range of motion.     Cervical back: Normal range of motion and neck supple.  Lymphadenopathy:     Cervical: No cervical adenopathy.  Skin:    General: Skin is warm and dry.  Neurological:     Mental Status: He is alert and oriented to person, place, and time.  Psychiatric:        Behavior: Behavior normal.      UC Treatments / Results  Labs (all labs ordered are listed, but only abnormal results are displayed) Labs Reviewed - No data to display  EKG   Radiology No results found.  Procedures Procedures (including critical care time)  Medications Ordered in UC Medications - No data to display  Initial Impression / Assessment and Plan / UC Course  I have reviewed the triage vital signs and the nursing notes.  Pertinent labs & imaging results that were available during my care of the patient were reviewed by me and considered in my medical decision making (see chart for details).     Lavage performed with warm water and peroxide today with full clearance of wax impaction.  TM visualized and benign post procedure and patient states symptoms have resolved.  Discussed Flonase, antihistamines, decongestants as needed for allergy symptoms.  Return for worsening symptoms.  Final Clinical Impressions(s) / UC Diagnoses   Final diagnoses:  Seasonal allergic rhinitis due to other allergic trigger  Impacted cerumen of left ear     Discharge Instructions      Take Zyrtec once daily and Flonase twice daily to keep seasonal allergy symptoms under control.  You may also use Coricidin HBP or other decongestants as needed for symptoms.  Regarding your earwax impaction, you may use Debrox drops and warm water either from the shower or a bulb syringe anytime you feel like wax is accumulating    ED Prescriptions     Medication Sig Dispense Auth. Provider   cetirizine (ZYRTEC ALLERGY) 10 MG tablet Take 1 tablet (10 mg total) by mouth daily. 30 tablet Particia Nearing, [place]       PDMP not reviewed this encounter.   Particia Nearing, [place] 10/22/22 1819

## 2022-10-22 NOTE — ED Triage Notes (Signed)
Pt reports ear wax build up in his left ear, throat drainage, and bilateral ear pain x 1 week.  Took ibuprofen

## 2022-10-28 DIAGNOSIS — I8500 Esophageal varices without bleeding: Secondary | ICD-10-CM | POA: Diagnosis not present

## 2022-10-28 DIAGNOSIS — K7460 Unspecified cirrhosis of liver: Secondary | ICD-10-CM | POA: Diagnosis not present

## 2022-10-28 DIAGNOSIS — E1165 Type 2 diabetes mellitus with hyperglycemia: Secondary | ICD-10-CM | POA: Diagnosis not present

## 2022-10-28 DIAGNOSIS — M109 Gout, unspecified: Secondary | ICD-10-CM | POA: Diagnosis not present

## 2022-10-28 DIAGNOSIS — N400 Enlarged prostate without lower urinary tract symptoms: Secondary | ICD-10-CM | POA: Diagnosis not present

## 2022-10-28 DIAGNOSIS — L719 Rosacea, unspecified: Secondary | ICD-10-CM | POA: Diagnosis not present

## 2022-10-28 DIAGNOSIS — Z6832 Body mass index (BMI) 32.0-32.9, adult: Secondary | ICD-10-CM | POA: Diagnosis not present

## 2022-10-28 DIAGNOSIS — K219 Gastro-esophageal reflux disease without esophagitis: Secondary | ICD-10-CM | POA: Diagnosis not present

## 2022-10-28 DIAGNOSIS — I10 Essential (primary) hypertension: Secondary | ICD-10-CM | POA: Diagnosis not present

## 2022-11-17 DIAGNOSIS — D485 Neoplasm of uncertain behavior of skin: Secondary | ICD-10-CM | POA: Diagnosis not present

## 2022-11-17 DIAGNOSIS — L280 Lichen simplex chronicus: Secondary | ICD-10-CM | POA: Diagnosis not present

## 2022-11-17 DIAGNOSIS — L570 Actinic keratosis: Secondary | ICD-10-CM | POA: Diagnosis not present

## 2022-11-17 DIAGNOSIS — L719 Rosacea, unspecified: Secondary | ICD-10-CM | POA: Diagnosis not present

## 2022-11-19 ENCOUNTER — Telehealth: Payer: Self-pay | Admitting: *Deleted

## 2022-11-19 NOTE — Telephone Encounter (Signed)
Pt dropped off BP list. Copy of placed on providers desk and sent to scan center.

## 2022-11-23 NOTE — Telephone Encounter (Signed)
Reviewed. Average BP is 122/75. Low was 103/71. Max 146/80.  Looks like he is tolerating Carvedilol 3.125 mg BID well. Has be been recording his HR by chance? If not, I recommend he record HR for 1 week and let me know. If his HR is not 55-60, we will plan to increase Carvedilol.

## 2022-11-24 ENCOUNTER — Encounter: Payer: Self-pay | Admitting: *Deleted

## 2022-11-30 ENCOUNTER — Ambulatory Visit (HOSPITAL_COMMUNITY)
Admission: RE | Admit: 2022-11-30 | Discharge: 2022-11-30 | Disposition: A | Discharge status: Home / Self Care | Payer: PPO | Source: Ambulatory Visit | Attending: Urology | Admitting: Urology

## 2022-11-30 DIAGNOSIS — N200 Calculus of kidney: Secondary | ICD-10-CM | POA: Diagnosis not present

## 2022-11-30 DIAGNOSIS — Z87442 Personal history of urinary calculi: Secondary | ICD-10-CM | POA: Diagnosis not present

## 2022-12-01 ENCOUNTER — Encounter: Payer: Self-pay | Admitting: Gastroenterology

## 2022-12-02 ENCOUNTER — Ambulatory Visit: Payer: PPO | Admitting: Urology

## 2022-12-02 VITALS — BP 123/77 | HR 80

## 2022-12-02 DIAGNOSIS — Z87442 Personal history of urinary calculi: Secondary | ICD-10-CM

## 2022-12-02 DIAGNOSIS — N201 Calculus of ureter: Secondary | ICD-10-CM | POA: Diagnosis not present

## 2022-12-02 MED ORDER — TAMSULOSIN HCL 0.4 MG PO CAPS: 0.4000 mg | ORAL_CAPSULE | Freq: Every day | ORAL | 1 refills | Status: DC

## 2022-12-02 NOTE — Progress Notes (Unsigned)
12/02/2022 4:05 PM   MONDRELL ANGLADE [birth year] [id number]  Referring provider: Donetta Potts, MD [address] [place],  [place] [address]  No chief complaint on file.   HPI: KUb from today shows possible left proximal ureteral calculus. He has intermittent left flank pain   PMH: Past Medical History:  Diagnosis Date   Acid reflux    Allergy    Cirrhosis (HCC)    Natural immunity to hepatitis A.  Completed hepatitis B vaccination May 2024.   COVID 04/2019   stuffy head sinus problems sob fatigue x 2 weeks   Diabetes mellitus without complication (HCC)    Elevated LFTs    since 2013   Fatty liver    Gout    Heart murmur    mild no cardiologist for last 20 yrs    History of kidney stones    Hypertension    Mitral regurgitation    mild    Prostate hypertrophy    Venous stasis    legs brown no vein doctor   Wears glasses     Surgical History: Past Surgical History:  Procedure Laterality Date   CARDIOVASCULAR STRESS TEST     CHOLECYSTECTOMY  late 1990's   COLONOSCOPY     COLONOSCOPY N/A 05/17/2019   Surgeon: Corbin Ade, MD;  seven 4-6 mm polyps removed.  Pathology with tubular adenomas and 1 hyperplastic polyp.  Recommended 3-year repeat.   COLONOSCOPY WITH PROPOFOL N/A 09/17/2022   Surgeon: Corbin Ade, MD; Six 4-6 mm polyps removed, sigmoid and descending colon diverticulosis, nonbleeding internal hemorrhoids, anal papilla.  Pathology with tubular adenomas and 1 sessile serrated polyp.  Recommended 3 year surveillance.   CYSTOSCOPY WITH LITHOLAPAXY N/A 04/12/2020   Procedure: CYSTOSCOPY WITH  BLADDER AND KIDNEY LITHOLAPAXY;  Surgeon: Sebastian Ache, MD;  Location: [facility];  Service: Urology;  Laterality: N/A;   CYSTOSCOPY WITH RETROGRADE PYELOGRAM, URETEROSCOPY AND STENT PLACEMENT Bilateral 04/12/2020   Procedure: CYSTOSCOPY WITH RETROGRADE PYELOGRAM, URETEROSCOPY AND STENT PLACEMENT;  Surgeon: Sebastian Ache, MD;  Location: [facility];  Service: Urology;  Laterality: Bilateral;   ESOPHAGOGASTRODUODENOSCOPY (EGD) WITH PROPOFOL N/A 09/17/2022   Surgeon: Corbin Ade, MD; 4 columns of grade 1-2 esophageal varices, portal hypertensive gastropathy.   EXTRACORPOREAL SHOCK WAVE LITHOTRIPSY  last done 18 yrs ago   4-5 times   HOLMIUM LASER APPLICATION Bilateral 04/12/2020   Procedure: HOLMIUM LASER APPLICATION;  Surgeon: Sebastian Ache, MD;  Location: [facility];  Service: Urology;  Laterality: Bilateral;   KIDNEY STONE SURGERY  1970's done x 2   POLYPECTOMY  05/17/2019   Procedure: POLYPECTOMY;  Surgeon: Corbin Ade, MD;  [facility];  Service: Endoscopy;;   POLYPECTOMY  09/17/2022   Procedure: POLYPECTOMY;  Surgeon: Corbin Ade, MD;  [facility];  Service: Endoscopy;;   VASECTOMY  late 1990's    Home Medications:  Allergies as of 12/02/2022       Reactions   Tape Other (See Comments)   Adhesive Addison Bailey        Medication List        Accurate as of December 02, 2022  4:05 PM. If you have any questions, ask your nurse or doctor.          acetaminophen 650 MG CR tablet Commonly known as: TYLENOL Take 650 mg by mouth every 8 (eight) hours as needed for pain.   allopurinol 300 MG tablet Commonly known as:  ZYLOPRIM Take 1 tablet (300 mg total) by mouth daily.   amLODipine 5 MG tablet Commonly known as: NORVASC Take 5 mg by mouth daily.   blood glucose meter kit and supplies Kit Dispense based on patient and insurance preference. Use daily as directed. (FOR ICD-10 code E11.9)   carvedilol 3.125 MG tablet Commonly known as: COREG Take 3.125 mg by mouth 2 (two) times daily.   cetirizine 10 MG tablet Commonly known as: ZyrTEC Allergy Take 1 tablet (10 mg total) by mouth daily.   chlorzoxazone 500 MG tablet Commonly known as: PARAFON Take by mouth as needed (Back spasm).   esomeprazole 40 MG capsule Commonly known as: NEXIUM TAKE 1  CAPSULE DAILY BEFOREBREAKFAST   finasteride 5 MG tablet Commonly known as: PROSCAR Take 1 tablet (5 mg total) by mouth daily.   fluticasone 50 MCG/ACT nasal spray Commonly known as: FLONASE Place 2 sprays into both nostrils daily.   glucose blood test strip Use to test blood sugar once daily. Use as instructed. One touch, verio strips. Dx:E11.9   ibuprofen 200 MG tablet Commonly known as: ADVIL Take 400 mg by mouth every 6 (six) hours as needed for mild pain or moderate pain.   indapamide 2.5 MG tablet Commonly known as: LOZOL Take 1 tablet (2.5 mg total) by mouth daily.   metronidazole 1 % cream Commonly known as: NORITATE Apply 1 Application topically every other day. face        Allergies:  Allergies  Allergen Reactions   Tape Other (See Comments)    Adhesive Addison Bailey    Family History: Family History  Problem Relation Age of Onset   Hypertension Mother    Pulmonary disease Father    Cirrhosis Maternal Grandfather        unknown etiology, non-alcoholic   Liver cancer Maternal Grandfather    Cirrhosis Cousin        unknown etiology, non-alcoholic   Liver cancer Cousin    Cirrhosis Cousin        alcoholic   Colon cancer Neg Hx     Social History:  reports that he quit smoking about 23 years ago. His smoking use included cigarettes. He started smoking about 49 years ago. He has a 52 pack-year smoking history. He quit smokeless tobacco use about 24 years ago. He reports that he does not drink alcohol and does not use drugs.  ROS: All other review of systems were reviewed and are negative except what is noted above in HPI  Physical Exam: BP 123/77   Pulse 80   Constitutional:  Alert and oriented, No acute distress. HEENT: [place] AT, moist mucus membranes.  Trachea midline, no masses. Cardiovascular: No clubbing, cyanosis, or edema. Respiratory: Normal respiratory effort, no increased work of breathing. GI: Abdomen is soft, nontender, nondistended, no abdominal  masses GU: No CVA tenderness.  Lymph: No cervical or inguinal lymphadenopathy. Skin: No rashes, bruises or suspicious lesions. Neurologic: Grossly intact, no focal deficits, moving all 4 extremities. Psychiatric: Normal mood and affect.  Laboratory Data: Lab Results  Component Value Date   WBC 5.7 09/15/2022   HGB 14.1 09/15/2022   HCT 43.1 09/15/2022   MCV 97.7 09/15/2022   PLT 125 (L) 09/15/2022    Lab Results  Component Value Date   CREATININE 1.28 (H) 09/15/2022    No results found for: "PSA"  No results found for: "TESTOSTERONE"  Lab Results  Component Value Date   HGBA1C 7.3 (H) 06/08/2022    Urinalysis  Component Value Date/Time   COLORURINE YELLOW 06/08/2022 2012   APPEARANCEUR Clear 08/12/2022 1004   LABSPEC 1.012 06/08/2022 2012   PHURINE 5.0 06/08/2022 2012   GLUCOSEU Negative 08/12/2022 1004   HGBUR MODERATE (A) 06/08/2022 2012   BILIRUBINUR Negative 08/12/2022 1004   KETONESUR NEGATIVE 06/08/2022 2012   PROTEINUR Trace 08/12/2022 1004   PROTEINUR 100 (A) 06/08/2022 2012   NITRITE Negative 08/12/2022 1004   NITRITE NEGATIVE 06/08/2022 2012   LEUKOCYTESUR Trace (A) 08/12/2022 1004   LEUKOCYTESUR LARGE (A) 06/08/2022 2012    Lab Results  Component Value Date   LABMICR See below: 08/12/2022   WBCUA 6-10 (A) 08/12/2022   LABEPIT 0-10 08/12/2022   MUCUS Present (A) 08/12/2022   BACTERIA None seen 08/12/2022    Pertinent Imaging: *** Results for orders placed in visit on 08/12/22  Abdomen 1 view (KUB)  Narrative CLINICAL DATA:  Nephrolithiasis  EXAM: ABDOMEN - 1 VIEW  COMPARISON:  07/02/2022.  FINDINGS: Stool artifacts project over both kidneys.  Questionable 3 mm calcification versus stool artifact projecting over RIGHT kidney.  No additional urinary tract calcifications.  Degenerative changes and levoconvex scoliosis lumbar spine.  Nonobstructive bowel gas pattern.  IMPRESSION: Questionable 3 mm RIGHT renal calculus  versus stool artifact.   Electronically Signed By: Ulyses Southward M.D. On: 08/12/2022 18:40  No results found for this or any previous visit.  No results found for this or any previous visit.  No results found for this or any previous visit.  No results found for this or any previous visit.  No valid procedures specified. No results found for this or any previous visit.  No results found for this or any previous visit.   Assessment & Plan:    1. Left ureteral calculus --We discussed the management of kidney stones. These options include observation, ureteroscopy, shockwave lithotripsy (ESWL) and percutaneous nephrolithotomy (PCNL). We discussed which options are relevant to the patient's stone(s). We discussed the natural history of kidney stones as well as the complications of untreated stones and the impact on quality of life without treatment as well as with each of the above listed treatments. We also discussed the efficacy of each treatment in its ability to clear the stone burden. With any of these management options I discussed the signs and symptoms of infection and the need for emergent treatment should these be experienced. For each option we discussed the ability of each procedure to clear the patient of their stone burden.   For observation I described the risks which include but are not limited to silent renal damage, life-threatening infection, need for emergent surgery, failure to pass stone and pain.   For ureteroscopy I described the risks which include bleeding, infection, damage to contiguous structures, positioning injury, ureteral stricture, ureteral avulsion, ureteral injury, need for prolonged ureteral stent, inability to perform ureteroscopy, need for an interval procedure, inability to clear stone burden, stent discomfort/pain, heart attack, stroke, pulmonary embolus and the inherent risks with general anesthesia.   For shockwave lithotripsy I described the risks  which include arrhythmia, kidney contusion, kidney hemorrhage, need for transfusion, pain, inability to adequately break up stone, inability to pass stone fragments, Steinstrasse, infection associated with obstructing stones, need for alternate surgical procedure, need for repeat shockwave lithotripsy, MI, CVA, PE and the inherent risks with anesthesia/conscious sedation.   For PCNL I described the risks including positioning injury, pneumothorax, hydrothorax, need for chest tube, inability to clear stone burden, renal laceration, arterial venous fistula or malformation, need  for embolization of kidney, loss of kidney or renal function, need for repeat procedure, need for prolonged nephrostomy tube, ureteral avulsion, MI, CVA, PE and the inherent risks of general anesthesia.   - The patient would like to proceed with medical expulsive therapy - Urinalysis, Routine w reflex microscopic   No follow-ups on file.  Wilkie Aye, MD  [facility] [place]

## 2022-12-03 ENCOUNTER — Encounter: Payer: Self-pay | Admitting: Urology

## 2022-12-03 LAB — URINALYSIS, ROUTINE W REFLEX MICROSCOPIC
Bilirubin, UA: NEGATIVE
Glucose, UA: NEGATIVE
Ketones, UA: NEGATIVE
Leukocytes,UA: NEGATIVE
Nitrite, UA: NEGATIVE
Protein,UA: NEGATIVE
Specific Gravity, UA: 1.015 (ref 1.005–1.030)
Urobilinogen, Ur: 0.2 mg/dL (ref 0.2–1.0)
pH, UA: 6 (ref 5.0–7.5)

## 2022-12-03 LAB — MICROSCOPIC EXAMINATION: Bacteria, UA: NONE SEEN

## 2022-12-03 NOTE — Telephone Encounter (Signed)
Sent MyChart message. Pt responded in MyChart.

## 2022-12-03 NOTE — Patient Instructions (Signed)

## 2022-12-11 ENCOUNTER — Other Ambulatory Visit: Payer: Self-pay | Admitting: Gastroenterology

## 2022-12-11 DIAGNOSIS — I8500 Esophageal varices without bleeding: Secondary | ICD-10-CM

## 2022-12-11 DIAGNOSIS — K7469 Other cirrhosis of liver: Secondary | ICD-10-CM

## 2022-12-11 MED ORDER — CARVEDILOL 6.25 MG PO TABS
6.2500 mg | ORAL_TABLET | Freq: Two times a day (BID) | ORAL | 5 refills | Status: AC
Start: 2022-12-11 — End: 2024-01-10

## 2022-12-15 ENCOUNTER — Ambulatory Visit (HOSPITAL_COMMUNITY)
Admission: RE | Admit: 2022-12-15 | Discharge: 2022-12-15 | Disposition: A | Discharge status: Home / Self Care | Payer: PPO | Source: Ambulatory Visit | Attending: Urology | Admitting: Urology

## 2022-12-15 DIAGNOSIS — Z87442 Personal history of urinary calculi: Secondary | ICD-10-CM | POA: Diagnosis not present

## 2022-12-15 DIAGNOSIS — N200 Calculus of kidney: Secondary | ICD-10-CM | POA: Diagnosis not present

## 2022-12-16 ENCOUNTER — Ambulatory Visit: Payer: PPO | Admitting: Urology

## 2022-12-16 VITALS — BP 116/76 | HR 73

## 2022-12-16 DIAGNOSIS — N138 Other obstructive and reflux uropathy: Secondary | ICD-10-CM

## 2022-12-16 DIAGNOSIS — Z09 Encounter for follow-up examination after completed treatment for conditions other than malignant neoplasm: Secondary | ICD-10-CM

## 2022-12-16 DIAGNOSIS — Z87442 Personal history of urinary calculi: Secondary | ICD-10-CM | POA: Diagnosis not present

## 2022-12-16 NOTE — Progress Notes (Signed)
12/16/2022 4:05 PM   Mark Leonard 1950/07/13 [id number]  Referring provider: Donetta Potts, MD [address] [place],  [place] [address]  Followup nephrolithiasis   HPI: [name] is a 72yo here for follow up for nephrolithiasis. He passed his stone 3 days ago. No flank pain. KUB shows no calculus. No significant LUTS. No other complanints today   PMH: Past Medical History:  Diagnosis Date   Acid reflux    Allergy    Cirrhosis (HCC)    Natural immunity to hepatitis A.  Completed hepatitis B vaccination May 2024.   COVID 04/2019   stuffy head sinus problems sob fatigue x 2 weeks   Diabetes mellitus without complication (HCC)    Elevated LFTs    since 2013   Fatty liver    Gout    Heart murmur    mild no cardiologist for last 20 yrs    History of kidney stones    Hypertension    Mitral regurgitation    mild    Prostate hypertrophy    Venous stasis    legs brown no vein doctor   Wears glasses     Surgical History: Past Surgical History:  Procedure Laterality Date   CARDIOVASCULAR STRESS TEST     CHOLECYSTECTOMY  late 1990's   COLONOSCOPY     COLONOSCOPY N/A 05/17/2019   Surgeon: Corbin Ade, MD;  seven 4-6 mm polyps removed.  Pathology with tubular adenomas and 1 hyperplastic polyp.  Recommended 3-year repeat.   COLONOSCOPY WITH PROPOFOL N/A 09/17/2022   Surgeon: Corbin Ade, MD; Six 4-6 mm polyps removed, sigmoid and descending colon diverticulosis, nonbleeding internal hemorrhoids, anal papilla.  Pathology with tubular adenomas and 1 sessile serrated polyp.  Recommended 3 year surveillance.   CYSTOSCOPY WITH LITHOLAPAXY N/A 04/12/2020   Procedure: CYSTOSCOPY WITH  BLADDER AND KIDNEY LITHOLAPAXY;  Surgeon: Sebastian Ache, MD;  Location: [facility];  Service: Urology;  Laterality: N/A;   CYSTOSCOPY WITH RETROGRADE PYELOGRAM, URETEROSCOPY AND STENT PLACEMENT Bilateral 04/12/2020   Procedure: CYSTOSCOPY WITH RETROGRADE PYELOGRAM,  URETEROSCOPY AND STENT PLACEMENT;  Surgeon: Sebastian Ache, MD;  Location: [facility];  Service: Urology;  Laterality: Bilateral;   ESOPHAGOGASTRODUODENOSCOPY (EGD) WITH PROPOFOL N/A 09/17/2022   Surgeon: Corbin Ade, MD; 4 columns of grade 1-2 esophageal varices, portal hypertensive gastropathy.   EXTRACORPOREAL SHOCK WAVE LITHOTRIPSY  last done 18 yrs ago   4-5 times   HOLMIUM LASER APPLICATION Bilateral 04/12/2020   Procedure: HOLMIUM LASER APPLICATION;  Surgeon: Sebastian Ache, MD;  Location: [facility];  Service: Urology;  Laterality: Bilateral;   KIDNEY STONE SURGERY  1970's done x 2   POLYPECTOMY  05/17/2019   Procedure: POLYPECTOMY;  Surgeon: Corbin Ade, MD;  [facility];  Service: Endoscopy;;   POLYPECTOMY  09/17/2022   Procedure: POLYPECTOMY;  Surgeon: Corbin Ade, MD;  [facility];  Service: Endoscopy;;   VASECTOMY  late 1990's    Home Medications:  Allergies as of 12/16/2022       Reactions   Tape Other (See Comments)   Adhesive Mark Leonard        Medication List        Accurate as of December 16, 2022  4:05 PM. If you have any questions, ask your nurse or doctor.          acetaminophen 650 MG CR tablet Commonly known as: TYLENOL Take 650 mg by mouth every 8 (eight)  hours as needed for pain.   allopurinol 300 MG tablet Commonly known as: ZYLOPRIM Take 1 tablet (300 mg total) by mouth daily.   blood glucose meter kit and supplies Kit Dispense based on patient and insurance preference. Use daily as directed. (FOR ICD-10 code E11.9)   carvedilol 6.25 MG tablet Commonly known as: Coreg Take 1 tablet (6.25 mg total) by mouth 2 (two) times daily.   cetirizine 10 MG tablet Commonly known as: ZyrTEC Allergy Take 1 tablet (10 mg total) by mouth daily.   chlorzoxazone 500 MG tablet Commonly known as: PARAFON Take by mouth as needed (Back spasm).   esomeprazole 40 MG capsule Commonly known as:  NEXIUM TAKE 1 CAPSULE DAILY BEFOREBREAKFAST   finasteride 5 MG tablet Commonly known as: PROSCAR Take 1 tablet (5 mg total) by mouth daily.   fluticasone 50 MCG/ACT nasal spray Commonly known as: FLONASE Place 2 sprays into both nostrils daily.   glucose blood test strip Use to test blood sugar once daily. Use as instructed. One touch, verio strips. Dx:E11.9   ibuprofen 200 MG tablet Commonly known as: ADVIL Take 400 mg by mouth every 6 (six) hours as needed for mild pain or moderate pain.   indapamide 2.5 MG tablet Commonly known as: LOZOL Take 1 tablet (2.5 mg total) by mouth daily.   metronidazole 1 % cream Commonly known as: NORITATE Apply 1 Application topically every other day. face   tamsulosin 0.4 MG Caps capsule Commonly known as: FLOMAX Take 1 capsule (0.4 mg total) by mouth daily.        Allergies:  Allergies  Allergen Reactions   Tape Other (See Comments)    Adhesive Mark Leonard    Family History: Family History  Problem Relation Age of Onset   Hypertension Mother    Pulmonary disease Father    Cirrhosis Maternal Grandfather        unknown etiology, non-alcoholic   Liver cancer Maternal Grandfather    Cirrhosis Cousin        unknown etiology, non-alcoholic   Liver cancer Cousin    Cirrhosis Cousin        alcoholic   Colon cancer Neg Hx     Social History:  reports that he quit smoking about 23 years ago. His smoking use included cigarettes. He started smoking about 49 years ago. He has a 52 pack-year smoking history. He quit smokeless tobacco use about 24 years ago. He reports that he does not drink alcohol and does not use drugs.  ROS: All other review of systems were reviewed and are negative except what is noted above in HPI  Physical Exam: BP 116/76   Pulse 73   Constitutional:  Alert and oriented, No acute distress. HEENT: [place] AT, moist mucus membranes.  Trachea midline, no masses. Cardiovascular: No clubbing, cyanosis, or  edema. Respiratory: Normal respiratory effort, no increased work of breathing. GI: Abdomen is soft, nontender, nondistended, no abdominal masses GU: No CVA tenderness.  Lymph: No cervical or inguinal lymphadenopathy. Skin: No rashes, bruises or suspicious lesions. Neurologic: Grossly intact, no focal deficits, moving all 4 extremities. Psychiatric: Normal mood and affect.  Laboratory Data: Lab Results  Component Value Date   WBC 5.7 09/15/2022   HGB 14.1 09/15/2022   HCT 43.1 09/15/2022   MCV 97.7 09/15/2022   PLT 125 (L) 09/15/2022    Lab Results  Component Value Date   CREATININE 1.28 (H) 09/15/2022    No results found for: "PSA"  No results found for: "  TESTOSTERONE"  Lab Results  Component Value Date   HGBA1C 7.3 (H) 06/08/2022    Urinalysis    Component Value Date/Time   COLORURINE YELLOW 06/08/2022 2012   APPEARANCEUR Clear 12/02/2022 1552   LABSPEC 1.012 06/08/2022 2012   PHURINE 5.0 06/08/2022 2012   GLUCOSEU Negative 12/02/2022 1552   HGBUR MODERATE (A) 06/08/2022 2012   BILIRUBINUR Negative 12/02/2022 1552   KETONESUR NEGATIVE 06/08/2022 2012   PROTEINUR Negative 12/02/2022 1552   PROTEINUR 100 (A) 06/08/2022 2012   NITRITE Negative 12/02/2022 1552   NITRITE NEGATIVE 06/08/2022 2012   LEUKOCYTESUR Negative 12/02/2022 1552   LEUKOCYTESUR LARGE (A) 06/08/2022 2012    Lab Results  Component Value Date   LABMICR See below: 12/02/2022   WBCUA 0-5 12/02/2022   LABEPIT 0-10 12/02/2022   MUCUS Present (A) 08/12/2022   BACTERIA None seen 12/02/2022    Pertinent Imaging: KUB today: Images reviewed and discussed with the patient  Results for orders placed during the hospital encounter of 11/30/22  Abdomen 1 view (KUB)  Narrative CLINICAL DATA:  Nephrolithiasis  EXAM: ABDOMEN - 1 VIEW  COMPARISON:  08/12/2022.  FINDINGS: The bowel gas pattern is normal. No radio-opaque calculi or other significant radiographic abnormality are  seen.  IMPRESSION: Negative.   Electronically Signed By: Layla Maw M.D. On: 12/05/2022 13:51  No results found for this or any previous visit.  No results found for this or any previous visit.  No results found for this or any previous visit.  No results found for this or any previous visit.  No valid procedures specified. No results found for this or any previous visit.  No results found for this or any previous visit.   Assessment & Plan:    1. History of kidney stones -followup 3 months with renal US - Urinalysis, Routine w reflex microscopic     No follow-ups on file.  Wilkie Aye, MD  [facility] [place]

## 2022-12-17 LAB — URINALYSIS, ROUTINE W REFLEX MICROSCOPIC
Bilirubin, UA: NEGATIVE
Glucose, UA: NEGATIVE
Ketones, UA: NEGATIVE
Leukocytes,UA: NEGATIVE
Nitrite, UA: NEGATIVE
Protein,UA: NEGATIVE
RBC, UA: NEGATIVE
Specific Gravity, UA: 1.02 (ref 1.005–1.030)
Urobilinogen, Ur: 0.2 mg/dL (ref 0.2–1.0)
pH, UA: 6 (ref 5.0–7.5)

## 2022-12-22 ENCOUNTER — Encounter: Payer: Self-pay | Admitting: Urology

## 2022-12-22 NOTE — Patient Instructions (Signed)

## 2022-12-25 ENCOUNTER — Other Ambulatory Visit (HOSPITAL_COMMUNITY): Payer: PPO

## 2022-12-30 ENCOUNTER — Encounter: Payer: Self-pay | Admitting: Internal Medicine

## 2023-01-05 ENCOUNTER — Telehealth: Payer: Self-pay | Admitting: Gastroenterology

## 2023-01-05 ENCOUNTER — Other Ambulatory Visit: Payer: Self-pay | Admitting: *Deleted

## 2023-01-05 ENCOUNTER — Encounter: Payer: Self-pay | Admitting: *Deleted

## 2023-01-05 DIAGNOSIS — K760 Fatty (change of) liver, not elsewhere classified: Secondary | ICD-10-CM

## 2023-01-05 DIAGNOSIS — K7469 Other cirrhosis of liver: Secondary | ICD-10-CM

## 2023-01-05 NOTE — Telephone Encounter (Signed)
Patient came to the office to schedule an OV but said he received a letter to schedule 2 tests.  Can you either call him to schedule or messge him thru MyChart.

## 2023-01-13 ENCOUNTER — Other Ambulatory Visit: Payer: Self-pay

## 2023-01-13 ENCOUNTER — Other Ambulatory Visit: Payer: Self-pay | Admitting: *Deleted

## 2023-01-13 ENCOUNTER — Ambulatory Visit (HOSPITAL_COMMUNITY)
Admission: RE | Admit: 2023-01-13 | Discharge: 2023-01-13 | Disposition: A | Discharge status: Home / Self Care | Payer: PPO | Source: Ambulatory Visit | Attending: Gastroenterology | Admitting: Gastroenterology

## 2023-01-13 ENCOUNTER — Other Ambulatory Visit (HOSPITAL_COMMUNITY)
Admission: RE | Admit: 2023-01-13 | Discharge: 2023-01-13 | Disposition: A | Discharge status: Home / Self Care | Payer: PPO | Source: Ambulatory Visit | Attending: Gastroenterology | Admitting: Gastroenterology

## 2023-01-13 DIAGNOSIS — K7469 Other cirrhosis of liver: Secondary | ICD-10-CM

## 2023-01-13 DIAGNOSIS — K7460 Unspecified cirrhosis of liver: Secondary | ICD-10-CM | POA: Diagnosis not present

## 2023-01-13 DIAGNOSIS — Z9049 Acquired absence of other specified parts of digestive tract: Secondary | ICD-10-CM | POA: Diagnosis not present

## 2023-01-13 DIAGNOSIS — K760 Fatty (change of) liver, not elsewhere classified: Secondary | ICD-10-CM

## 2023-01-14 LAB — AFP TUMOR MARKER: AFP, Serum, Tumor Marker: 3.6 ng/mL (ref 0.0–8.4)

## 2023-02-01 DIAGNOSIS — H31092 Other chorioretinal scars, left eye: Secondary | ICD-10-CM | POA: Diagnosis not present

## 2023-02-01 DIAGNOSIS — H5203 Hypermetropia, bilateral: Secondary | ICD-10-CM | POA: Diagnosis not present

## 2023-02-08 DIAGNOSIS — E119 Type 2 diabetes mellitus without complications: Secondary | ICD-10-CM | POA: Diagnosis not present

## 2023-02-08 DIAGNOSIS — R7989 Other specified abnormal findings of blood chemistry: Secondary | ICD-10-CM | POA: Diagnosis not present

## 2023-02-08 DIAGNOSIS — E875 Hyperkalemia: Secondary | ICD-10-CM | POA: Diagnosis not present

## 2023-02-11 ENCOUNTER — Encounter: Payer: Self-pay | Admitting: *Deleted

## 2023-02-11 DIAGNOSIS — Z6832 Body mass index (BMI) 32.0-32.9, adult: Secondary | ICD-10-CM | POA: Diagnosis not present

## 2023-02-11 DIAGNOSIS — M109 Gout, unspecified: Secondary | ICD-10-CM | POA: Diagnosis not present

## 2023-02-11 DIAGNOSIS — N400 Enlarged prostate without lower urinary tract symptoms: Secondary | ICD-10-CM | POA: Diagnosis not present

## 2023-02-11 DIAGNOSIS — K219 Gastro-esophageal reflux disease without esophagitis: Secondary | ICD-10-CM | POA: Diagnosis not present

## 2023-02-11 DIAGNOSIS — K7460 Unspecified cirrhosis of liver: Secondary | ICD-10-CM | POA: Diagnosis not present

## 2023-02-11 DIAGNOSIS — I10 Essential (primary) hypertension: Secondary | ICD-10-CM | POA: Diagnosis not present

## 2023-02-11 DIAGNOSIS — I8500 Esophageal varices without bleeding: Secondary | ICD-10-CM | POA: Diagnosis not present

## 2023-02-11 DIAGNOSIS — E1165 Type 2 diabetes mellitus with hyperglycemia: Secondary | ICD-10-CM | POA: Diagnosis not present

## 2023-02-11 DIAGNOSIS — L719 Rosacea, unspecified: Secondary | ICD-10-CM | POA: Diagnosis not present

## 2023-02-22 DIAGNOSIS — Z6832 Body mass index (BMI) 32.0-32.9, adult: Secondary | ICD-10-CM | POA: Diagnosis not present

## 2023-02-22 DIAGNOSIS — I8500 Esophageal varices without bleeding: Secondary | ICD-10-CM | POA: Diagnosis not present

## 2023-02-22 DIAGNOSIS — E1165 Type 2 diabetes mellitus with hyperglycemia: Secondary | ICD-10-CM | POA: Diagnosis not present

## 2023-02-22 DIAGNOSIS — L719 Rosacea, unspecified: Secondary | ICD-10-CM | POA: Diagnosis not present

## 2023-02-22 DIAGNOSIS — K219 Gastro-esophageal reflux disease without esophagitis: Secondary | ICD-10-CM | POA: Diagnosis not present

## 2023-02-22 DIAGNOSIS — K7460 Unspecified cirrhosis of liver: Secondary | ICD-10-CM | POA: Diagnosis not present

## 2023-02-22 DIAGNOSIS — M109 Gout, unspecified: Secondary | ICD-10-CM | POA: Diagnosis not present

## 2023-02-22 DIAGNOSIS — I10 Essential (primary) hypertension: Secondary | ICD-10-CM | POA: Diagnosis not present

## 2023-02-22 DIAGNOSIS — N400 Enlarged prostate without lower urinary tract symptoms: Secondary | ICD-10-CM | POA: Diagnosis not present

## 2023-03-09 ENCOUNTER — Other Ambulatory Visit: Payer: PPO

## 2023-03-09 ENCOUNTER — Ambulatory Visit (HOSPITAL_COMMUNITY)
Admission: RE | Admit: 2023-03-09 | Discharge: 2023-03-09 | Disposition: A | Discharge status: Home / Self Care | Payer: PPO | Source: Ambulatory Visit | Attending: Urology | Admitting: Urology

## 2023-03-09 DIAGNOSIS — Z0389 Encounter for observation for other suspected diseases and conditions ruled out: Secondary | ICD-10-CM | POA: Diagnosis not present

## 2023-03-09 DIAGNOSIS — Z87442 Personal history of urinary calculi: Secondary | ICD-10-CM | POA: Diagnosis not present

## 2023-03-10 DIAGNOSIS — Z6832 Body mass index (BMI) 32.0-32.9, adult: Secondary | ICD-10-CM | POA: Diagnosis not present

## 2023-03-10 DIAGNOSIS — R030 Elevated blood-pressure reading, without diagnosis of hypertension: Secondary | ICD-10-CM | POA: Diagnosis not present

## 2023-03-10 DIAGNOSIS — K219 Gastro-esophageal reflux disease without esophagitis: Secondary | ICD-10-CM | POA: Diagnosis not present

## 2023-03-10 DIAGNOSIS — E119 Type 2 diabetes mellitus without complications: Secondary | ICD-10-CM | POA: Diagnosis not present

## 2023-03-10 DIAGNOSIS — N400 Enlarged prostate without lower urinary tract symptoms: Secondary | ICD-10-CM | POA: Diagnosis not present

## 2023-03-10 DIAGNOSIS — I8500 Esophageal varices without bleeding: Secondary | ICD-10-CM | POA: Diagnosis not present

## 2023-03-10 DIAGNOSIS — K7460 Unspecified cirrhosis of liver: Secondary | ICD-10-CM | POA: Diagnosis not present

## 2023-03-18 DIAGNOSIS — E875 Hyperkalemia: Secondary | ICD-10-CM | POA: Diagnosis not present

## 2023-03-19 ENCOUNTER — Ambulatory Visit: Payer: PPO | Admitting: Urology

## 2023-03-19 VITALS — BP 116/68 | HR 72

## 2023-03-19 DIAGNOSIS — Z87442 Personal history of urinary calculi: Secondary | ICD-10-CM

## 2023-03-19 DIAGNOSIS — Z09 Encounter for follow-up examination after completed treatment for conditions other than malignant neoplasm: Secondary | ICD-10-CM

## 2023-03-19 LAB — URINALYSIS, ROUTINE W REFLEX MICROSCOPIC
Bilirubin, UA: NEGATIVE
Glucose, UA: NEGATIVE
Ketones, UA: NEGATIVE
Leukocytes,UA: NEGATIVE
Nitrite, UA: NEGATIVE
Protein,UA: NEGATIVE
RBC, UA: NEGATIVE
Specific Gravity, UA: 1.015 (ref 1.005–1.030)
Urobilinogen, Ur: 1 mg/dL (ref 0.2–1.0)
pH, UA: 6 (ref 5.0–7.5)

## 2023-03-19 NOTE — Progress Notes (Unsigned)
03/19/2023 12:01 PM   Mark Leonard 03-21-1951 [id number]  Referring provider: Donetta Potts, MD [address] [place],  [place] [address]  No chief complaint on file.   HPI: Renal US shows no calculi and no hydronephrosis   PMH: Past Medical History:  Diagnosis Date   Acid reflux    Allergy    Cirrhosis (HCC)    Natural immunity to hepatitis A.  Completed hepatitis B vaccination May 2024.   COVID 04/2019   stuffy head sinus problems sob fatigue x 2 weeks   Diabetes mellitus without complication (HCC)    Elevated LFTs    since 2013   Fatty liver    Gout    Heart murmur    mild no cardiologist for last 20 yrs    History of kidney stones    Hypertension    Mitral regurgitation    mild    Prostate hypertrophy    Venous stasis    legs brown no vein doctor   Wears glasses     Surgical History: Past Surgical History:  Procedure Laterality Date   CARDIOVASCULAR STRESS TEST     CHOLECYSTECTOMY  late 1990's   COLONOSCOPY     COLONOSCOPY N/A 05/17/2019   Surgeon: Corbin Ade, MD;  seven 4-6 mm polyps removed.  Pathology with tubular adenomas and 1 hyperplastic polyp.  Recommended 3-year repeat.   COLONOSCOPY WITH PROPOFOL N/A 09/17/2022   Surgeon: Corbin Ade, MD; Six 4-6 mm polyps removed, sigmoid and descending colon diverticulosis, nonbleeding internal hemorrhoids, anal papilla.  Pathology with tubular adenomas and 1 sessile serrated polyp.  Recommended 3 year surveillance.   CYSTOSCOPY WITH LITHOLAPAXY N/A 04/12/2020   Procedure: CYSTOSCOPY WITH  BLADDER AND KIDNEY LITHOLAPAXY;  Surgeon: Sebastian Ache, MD;  Location: [facility];  Service: Urology;  Laterality: N/A;   CYSTOSCOPY WITH RETROGRADE PYELOGRAM, URETEROSCOPY AND STENT PLACEMENT Bilateral 04/12/2020   Procedure: CYSTOSCOPY WITH RETROGRADE PYELOGRAM, URETEROSCOPY AND STENT PLACEMENT;  Surgeon: Sebastian Ache, MD;  Location: [facility];  Service: Urology;   Laterality: Bilateral;   ESOPHAGOGASTRODUODENOSCOPY (EGD) WITH PROPOFOL N/A 09/17/2022   Surgeon: Corbin Ade, MD; 4 columns of grade 1-2 esophageal varices, portal hypertensive gastropathy.   EXTRACORPOREAL SHOCK WAVE LITHOTRIPSY  last done 18 yrs ago   4-5 times   HOLMIUM LASER APPLICATION Bilateral 04/12/2020   Procedure: HOLMIUM LASER APPLICATION;  Surgeon: Sebastian Ache, MD;  Location: [facility];  Service: Urology;  Laterality: Bilateral;   KIDNEY STONE SURGERY  1970's done x 2   POLYPECTOMY  05/17/2019   Procedure: POLYPECTOMY;  Surgeon: Corbin Ade, MD;  [facility];  Service: Endoscopy;;   POLYPECTOMY  09/17/2022   Procedure: POLYPECTOMY;  Surgeon: Corbin Ade, MD;  [facility];  Service: Endoscopy;;   VASECTOMY  late 1990's    Home Medications:  Allergies as of 03/19/2023       Reactions   Tape Other (See Comments)   Adhesive Addison Bailey        Medication List        Accurate as of March 19, 2023 12:01 PM. If you have any questions, ask your nurse or doctor.          acetaminophen 650 MG CR tablet Commonly known as: TYLENOL Take 650 mg by mouth every 8 (eight) hours as needed for pain.   allopurinol 300 MG tablet Commonly known as: ZYLOPRIM Take 1 tablet (300 mg total) by  mouth daily.   blood glucose meter kit and supplies Kit Dispense based on patient and insurance preference. Use daily as directed. (FOR ICD-10 code E11.9)   carvedilol 6.25 MG tablet Commonly known as: Coreg Take 1 tablet (6.25 mg total) by mouth 2 (two) times daily.   cetirizine 10 MG tablet Commonly known as: ZyrTEC Allergy Take 1 tablet (10 mg total) by mouth daily.   chlorzoxazone 500 MG tablet Commonly known as: PARAFON Take by mouth as needed (Back spasm).   esomeprazole 40 MG capsule Commonly known as: NEXIUM TAKE 1 CAPSULE DAILY BEFOREBREAKFAST   finasteride 5 MG tablet Commonly known as: PROSCAR Take 1 tablet (5 mg  total) by mouth daily.   fluticasone 50 MCG/ACT nasal spray Commonly known as: FLONASE Place 2 sprays into both nostrils daily.   glipiZIDE 5 MG tablet Commonly known as: GLUCOTROL Take 5 mg by mouth every morning.   glucose blood test strip Use to test blood sugar once daily. Use as instructed. One touch, verio strips. Dx:E11.9   ibuprofen 200 MG tablet Commonly known as: ADVIL Take 400 mg by mouth every 6 (six) hours as needed for mild pain or moderate pain.   indapamide 2.5 MG tablet Commonly known as: LOZOL Take 1 tablet (2.5 mg total) by mouth daily.   metronidazole 1 % cream Commonly known as: NORITATE Apply 1 Application topically every other day. face   tamsulosin 0.4 MG Caps capsule Commonly known as: FLOMAX Take 1 capsule (0.4 mg total) by mouth daily.        Allergies:  Allergies  Allergen Reactions   Tape Other (See Comments)    Adhesive Addison Bailey    Family History: Family History  Problem Relation Age of Onset   Hypertension Mother    Pulmonary disease Father    Cirrhosis Maternal Grandfather        unknown etiology, non-alcoholic   Liver cancer Maternal Grandfather    Cirrhosis Cousin        unknown etiology, non-alcoholic   Liver cancer Cousin    Cirrhosis Cousin        alcoholic   Colon cancer Neg Hx     Social History:  reports that he quit smoking about 23 years ago. His smoking use included cigarettes. He started smoking about 49 years ago. He has a 52 pack-year smoking history. He quit smokeless tobacco use about 24 years ago. He reports that he does not drink alcohol and does not use drugs.  ROS: All other review of systems were reviewed and are negative except what is noted above in HPI  Physical Exam: BP 116/68   Pulse 72   Constitutional:  Alert and oriented, No acute distress. HEENT: [place] AT, moist mucus membranes.  Trachea midline, no masses. Cardiovascular: No clubbing, cyanosis, or edema. Respiratory: Normal respiratory effort,  no increased work of breathing. GI: Abdomen is soft, nontender, nondistended, no abdominal masses GU: No CVA tenderness.  Lymph: No cervical or inguinal lymphadenopathy. Skin: No rashes, bruises or suspicious lesions. Neurologic: Grossly intact, no focal deficits, moving all 4 extremities. Psychiatric: Normal mood and affect.  Laboratory Data: Lab Results  Component Value Date   WBC 5.7 09/15/2022   HGB 14.1 09/15/2022   HCT 43.1 09/15/2022   MCV 97.7 09/15/2022   PLT 125 (L) 09/15/2022    Lab Results  Component Value Date   CREATININE 1.28 (H) 09/15/2022    No results found for: "PSA"  No results found for: "TESTOSTERONE"  Lab Results  Component Value Date   HGBA1C 7.3 (H) 06/08/2022    Urinalysis    Component Value Date/Time   COLORURINE YELLOW 06/08/2022 2012   APPEARANCEUR Clear 12/16/2022 1610   LABSPEC 1.012 06/08/2022 2012   PHURINE 5.0 06/08/2022 2012   GLUCOSEU Negative 12/16/2022 1610   HGBUR MODERATE (A) 06/08/2022 2012   BILIRUBINUR Negative 12/16/2022 1610   KETONESUR NEGATIVE 06/08/2022 2012   PROTEINUR Negative 12/16/2022 1610   PROTEINUR 100 (A) 06/08/2022 2012   NITRITE Negative 12/16/2022 1610   NITRITE NEGATIVE 06/08/2022 2012   LEUKOCYTESUR Negative 12/16/2022 1610   LEUKOCYTESUR LARGE (A) 06/08/2022 2012    Lab Results  Component Value Date   LABMICR Comment 12/16/2022   WBCUA 0-5 12/02/2022   LABEPIT 0-10 12/02/2022   MUCUS Present (A) 08/12/2022   BACTERIA None seen 12/02/2022    Pertinent Imaging: *** Results for orders placed during the hospital encounter of 12/15/22  Abdomen 1 view (KUB)  Narrative CLINICAL DATA:  Nephrolithiasis.  EXAM: ABDOMEN - 1 VIEW  COMPARISON:  Radiograph 11/30/2022.  CT 06/08/2022  FINDINGS: The punctate left intrarenal calculi on prior CT have no radiographic correlate. No visualized urolithiasis. Normal bowel gas pattern. Small to moderate colonic stool burden. Cholecystectomy clips in  the right upper quadrant.  IMPRESSION: The punctate left intrarenal calculi on prior CT have no radiographic correlate. No urolithiasis demonstrated by radiograph.   Electronically Signed By: Narda Rutherford M.D. On: 12/26/2022 09:56  No results found for this or any previous visit.  No results found for this or any previous visit.  No results found for this or any previous visit.  Results for orders placed during the hospital encounter of 03/09/23  Ultrasound renal complete  Narrative CLINICAL DATA:  Nephrolithiasis  EXAM: RENAL / URINARY TRACT ULTRASOUND COMPLETE  COMPARISON:  Ultrasound abdomen 01/13/2023  FINDINGS: Right Kidney:  Renal measurements: 10.5 x 4.1 x 5.0 cm = volume: 114 mL. Echogenicity within normal limits. No mass or hydronephrosis visualized.  Left Kidney:  Renal measurements: 10.5 x 3.6 x 4.8 cm = volume: 94 mL. Echogenicity within normal limits. No mass or hydronephrosis visualized.  Bladder:  Appears normal for degree of bladder distention.  Other:  None.  IMPRESSION: No hydronephrosis.   Electronically Signed By: Annia Belt M.D. On: 03/09/2023 15:53  No results found for this or any previous visit.  No results found for this or any previous visit.  No results found for this or any previous visit.   Assessment & Plan:    1. History of kidney stones (Primary) Followup 1 year with PSA - Urinalysis, Routine w reflex microscopic   No follow-ups on file.  Wilkie Aye, MD  [facility]

## 2023-03-23 ENCOUNTER — Encounter: Payer: Self-pay | Admitting: Urology

## 2023-03-23 NOTE — Patient Instructions (Signed)

## 2023-03-24 DIAGNOSIS — N400 Enlarged prostate without lower urinary tract symptoms: Secondary | ICD-10-CM | POA: Diagnosis not present

## 2023-03-24 DIAGNOSIS — I8500 Esophageal varices without bleeding: Secondary | ICD-10-CM | POA: Diagnosis not present

## 2023-03-24 DIAGNOSIS — Z6832 Body mass index (BMI) 32.0-32.9, adult: Secondary | ICD-10-CM | POA: Diagnosis not present

## 2023-03-24 DIAGNOSIS — K219 Gastro-esophageal reflux disease without esophagitis: Secondary | ICD-10-CM | POA: Diagnosis not present

## 2023-03-24 DIAGNOSIS — Z1389 Encounter for screening for other disorder: Secondary | ICD-10-CM | POA: Diagnosis not present

## 2023-03-24 DIAGNOSIS — R030 Elevated blood-pressure reading, without diagnosis of hypertension: Secondary | ICD-10-CM | POA: Diagnosis not present

## 2023-03-24 DIAGNOSIS — E119 Type 2 diabetes mellitus without complications: Secondary | ICD-10-CM | POA: Diagnosis not present

## 2023-03-24 DIAGNOSIS — K7460 Unspecified cirrhosis of liver: Secondary | ICD-10-CM | POA: Diagnosis not present

## 2023-03-24 DIAGNOSIS — Z1331 Encounter for screening for depression: Secondary | ICD-10-CM | POA: Diagnosis not present

## 2023-04-19 ENCOUNTER — Ambulatory Visit: Payer: PPO | Admitting: Gastroenterology

## 2023-05-05 DIAGNOSIS — N400 Enlarged prostate without lower urinary tract symptoms: Secondary | ICD-10-CM | POA: Diagnosis not present

## 2023-05-05 DIAGNOSIS — I10 Essential (primary) hypertension: Secondary | ICD-10-CM | POA: Diagnosis not present

## 2023-05-05 DIAGNOSIS — E1165 Type 2 diabetes mellitus with hyperglycemia: Secondary | ICD-10-CM | POA: Diagnosis not present

## 2023-05-05 DIAGNOSIS — Z6832 Body mass index (BMI) 32.0-32.9, adult: Secondary | ICD-10-CM | POA: Diagnosis not present

## 2023-05-05 DIAGNOSIS — K7460 Unspecified cirrhosis of liver: Secondary | ICD-10-CM | POA: Diagnosis not present

## 2023-05-14 ENCOUNTER — Ambulatory Visit
Admission: EM | Admit: 2023-05-14 | Discharge: 2023-05-14 | Disposition: A | Discharge status: Home / Self Care | Payer: HMO | Attending: Family Medicine | Admitting: Family Medicine

## 2023-05-14 DIAGNOSIS — H6123 Impacted cerumen, bilateral: Secondary | ICD-10-CM

## 2023-05-14 DIAGNOSIS — H6993 Unspecified Eustachian tube disorder, bilateral: Secondary | ICD-10-CM | POA: Diagnosis not present

## 2023-05-14 MED ORDER — DEXAMETHASONE SODIUM PHOSPHATE 10 MG/ML IJ SOLN
10.0000 mg | Freq: Once | INTRAMUSCULAR | Status: AC
  Administered 2023-05-14: 10 mg via INTRAMUSCULAR

## 2023-05-14 NOTE — Discharge Instructions (Signed)
 We have cleared out the wax impactions to both of your ears today.  We have also given you a steroid shot to help with your middle ear effusion and congestion.  Continue your Flonase  twice daily, allergy medication, decongestant such as Coricidin HBP.  Follow-up for worsening symptoms

## 2023-05-14 NOTE — ED Provider Notes (Signed)
 [facility]    CSN: [id number] Arrival date & time: 05/14/23  1642      History   Chief Complaint Chief Complaint  Patient presents with   Ear Fullness    HPI Mark Leonard is a 73 y.o. male.   Patient presenting today with several day history of sinus congestion no 1 day of bilateral ear pressure, fullness, muffled hearing.  Denies fever, chills, ear drainage, complete loss of hearing, headaches.  States history of ongoing ear issues, frequent ear infections but he is also wondering if he has wax buildup.  So far trying Flonase , Zyrtec  with minimal relief.    Past Medical History:  Diagnosis Date   Acid reflux    Allergy    Cirrhosis (HCC)    Natural immunity to hepatitis A.  Completed hepatitis B vaccination May 2024.   COVID 04/2019   stuffy head sinus problems sob fatigue x 2 weeks   Diabetes mellitus without complication (HCC)    Elevated LFTs    since 2013   Fatty liver    Gout    Heart murmur    mild no cardiologist for last 20 yrs    History of kidney stones    Hypertension    Mitral regurgitation    mild    Prostate hypertrophy    Venous stasis    legs brown no vein doctor   Wears glasses     Patient Active Problem List   Diagnosis Date Noted   UTI (urinary tract infection) 06/08/2022   Left ureteral stone 06/08/2022   AKI (acute kidney injury) (HCC) 06/08/2022   Elevated LFTs 10/13/2021   Diabetes (HCC) 05/27/2013   Esophageal reflux 09/21/2012   Gout 09/21/2012   Fatty liver 09/21/2012   Prostate hypertrophy 09/21/2012   Venous stasis 09/21/2012   Essential hypertension, benign 06/22/2012    Past Surgical History:  Procedure Laterality Date   CARDIOVASCULAR STRESS TEST     CHOLECYSTECTOMY  late 1990's   COLONOSCOPY     COLONOSCOPY N/A 05/17/2019   Surgeon: Shaaron Lamar HERO, MD;  seven 4-6 mm polyps removed.  Pathology with tubular adenomas and 1 hyperplastic polyp.  Recommended 3-year repeat.   COLONOSCOPY WITH PROPOFOL  N/A  09/17/2022   Surgeon: Shaaron Lamar HERO, MD; Six 4-6 mm polyps removed, sigmoid and descending colon diverticulosis, nonbleeding internal hemorrhoids, anal papilla.  Pathology with tubular adenomas and 1 sessile serrated polyp.  Recommended 3 year surveillance.   CYSTOSCOPY WITH LITHOLAPAXY N/A 04/12/2020   Procedure: CYSTOSCOPY WITH  BLADDER AND KIDNEY LITHOLAPAXY;  Surgeon: Alvaro Hummer, MD;  Location: [facility];  Service: Urology;  Laterality: N/A;   CYSTOSCOPY WITH RETROGRADE PYELOGRAM, URETEROSCOPY AND STENT PLACEMENT Bilateral 04/12/2020   Procedure: CYSTOSCOPY WITH RETROGRADE PYELOGRAM, URETEROSCOPY AND STENT PLACEMENT;  Surgeon: Alvaro Hummer, MD;  Location: [facility];  Service: Urology;  Laterality: Bilateral;   ESOPHAGOGASTRODUODENOSCOPY (EGD) WITH PROPOFOL  N/A 09/17/2022   Surgeon: Shaaron Lamar HERO, MD; 4 columns of grade 1-2 esophageal varices, portal hypertensive gastropathy.   EXTRACORPOREAL SHOCK WAVE LITHOTRIPSY  last done 18 yrs ago   4-5 times   HOLMIUM LASER APPLICATION Bilateral 04/12/2020   Procedure: HOLMIUM LASER APPLICATION;  Surgeon: Alvaro Hummer, MD;  Location: [facility];  Service: Urology;  Laterality: Bilateral;   KIDNEY STONE SURGERY  1970's done x 2   POLYPECTOMY  05/17/2019   Procedure: POLYPECTOMY;  Surgeon: Shaaron Lamar HERO, MD;  [facility];  Service: Endoscopy;;  POLYPECTOMY  09/17/2022   Procedure: POLYPECTOMY;  Surgeon: Shaaron Lamar HERO, MD;  [facility];  Service: Endoscopy;;   VASECTOMY  late 1990's       Home Medications    Prior to Admission medications   Medication Sig Start Date End Date Taking? Authorizing Provider  acetaminophen  (TYLENOL ) 650 MG CR tablet Take 650 mg by mouth every 8 (eight) hours as needed for pain.    [provider]  allopurinol  (ZYLOPRIM ) 300 MG tablet Take 1 tablet (300 mg total) by mouth daily. 07/29/20   Waddell Catholic M, DO  blood  glucose meter kit and supplies KIT Dispense based on patient and insurance preference. Use daily as directed. (FOR ICD-10 code E11.9) 07/06/17   Alphonsa Elsie RAMAN, MD  carvedilol  (COREG ) 6.25 MG tablet Take 1 tablet (6.25 mg total) by mouth 2 (two) times daily. 12/11/22 12/11/23  Rudy Josette RAMAN, PA-C  cetirizine  (ZYRTEC  ALLERGY) 10 MG tablet Take 1 tablet (10 mg total) by mouth daily. 10/22/22   Stuart Vernell Norris, PA-C  chlorzoxazone  (PARAFON ) 500 MG tablet Take by mouth as needed (Back spasm).    [provider]  esomeprazole  (NEXIUM ) 40 MG capsule TAKE 1 CAPSULE DAILY BEFOREBREAKFAST 07/29/20   Waddell, Malena M, DO  finasteride  (PROSCAR ) 5 MG tablet Take 1 tablet (5 mg total) by mouth daily. 08/12/22   McKenzie, Belvie CROME, MD  fluticasone  (FLONASE ) 50 MCG/ACT nasal spray Place 2 sprays into both nostrils daily. 02/13/22   Leath-Warren, Etta PARAS, NP  glipiZIDE (GLUCOTROL) 5 MG tablet Take 5 mg by mouth every morning. 03/10/23   [provider]  glucose blood test strip Use to test blood sugar once daily. Use as instructed. One touch, verio strips. Dx:E11.9 01/29/20   Waddell Catholic M, DO  ibuprofen (ADVIL) 200 MG tablet Take 400 mg by mouth every 6 (six) hours as needed for mild pain or moderate pain.    [provider]  indapamide  (LOZOL ) 2.5 MG tablet Take 1 tablet (2.5 mg total) by mouth daily. 08/12/22   McKenzie, Belvie CROME, MD  metronidazole  (NORITATE ) 1 % cream Apply 1 Application topically every other day. face    [provider]  tamsulosin  (FLOMAX ) 0.4 MG CAPS capsule Take 1 capsule (0.4 mg total) by mouth daily. 12/02/22   McKenzie, Belvie CROME, MD    Family History Family History  Problem Relation Age of Onset   Hypertension Mother    Pulmonary disease Father    Cirrhosis Maternal Grandfather        unknown etiology, non-alcoholic   Liver cancer Maternal Grandfather    Cirrhosis Cousin        unknown etiology, non-alcoholic   Liver cancer Cousin     Cirrhosis Cousin        alcoholic   Colon cancer Neg Hx     Social History Social History   Tobacco Use   Smoking status: Former    Current packs/day: 0.00    Average packs/day: 2.0 packs/day for 26.0 years (52.0 ttl pk-yrs)    Types: Cigarettes    Start date: 04/03/1973    Quit date: 04/04/1999    Years since quitting: 24.1   Smokeless tobacco: Former    Quit date: 05/26/1998  Vaping Use   Vaping status: Never Used  Substance Use Topics   Alcohol  use: No    Alcohol /week: 0.0 standard drinks of alcohol    Drug use: No     Allergies   Tape   Review of  Systems Review of Systems Per HPI  Physical Exam Triage Vital Signs ED Triage Vitals  Encounter Vitals Group     BP 05/14/23 1708 119/77     Systolic BP Percentile --      Diastolic BP Percentile --      Pulse Rate 05/14/23 1708 92     Resp 05/14/23 1708 16     Temp 05/14/23 1708 98.6 F (37 C)     Temp Source 05/14/23 1708 Oral     SpO2 05/14/23 1708 94 %     Weight --      Height --      Head Circumference --      Peak Flow --      Pain Score 05/14/23 1709 0     Pain Loc --      Pain Education --      Exclude from Growth Chart --    No data found.  Updated Vital Signs BP 119/77 (BP Location: Right Arm)   Pulse 92   Temp 98.6 F (37 C) (Oral)   Resp 16   SpO2 94%   Visual Acuity Right Eye Distance:   Left Eye Distance:   Bilateral Distance:    Right Eye Near:   Left Eye Near:    Bilateral Near:     Physical Exam Vitals and nursing note reviewed.  Constitutional:      Appearance: Normal appearance.  HENT:     Head: Atraumatic.     Right Ear: There is impacted cerumen.     Left Ear: There is impacted cerumen.     Ears:     Comments: Bilateral middle ear effusion present and visualized after wax impaction removal via lavage    Nose: Rhinorrhea present.     Mouth/Throat:     Mouth: Mucous membranes are moist.     Pharynx: Oropharynx is clear.  Eyes:     Extraocular Movements:  Extraocular movements intact.     Conjunctiva/sclera: Conjunctivae normal.  Cardiovascular:     Rate and Rhythm: Normal rate and regular rhythm.  Pulmonary:     Effort: Pulmonary effort is normal.     Breath sounds: Normal breath sounds.  Musculoskeletal:        General: Normal range of motion.     Cervical back: Normal range of motion and neck supple.  Skin:    General: Skin is warm and dry.  Neurological:     General: No focal deficit present.     Mental Status: He is oriented to person, place, and time.     Motor: No weakness.     Gait: Gait normal.  Psychiatric:        Mood and Affect: Mood normal.        Thought Content: Thought content normal.        Judgment: Judgment normal.      UC Treatments / Results  Labs (all labs ordered are listed, but only abnormal results are displayed) Labs Reviewed - No data to display  EKG   Radiology No results found.  Procedures Procedures (including critical care time)  Medications Ordered in UC Medications  dexamethasone  (DECADRON ) injection 10 mg (10 mg Intramuscular Given 05/14/23 1900)    Initial Impression / Assessment and Plan / UC Course  I have reviewed the triage vital signs and the nursing notes.  Pertinent labs & imaging results that were available during my care of the patient were reviewed by me and considered in my medical decision  making (see chart for details).     A lavage performed today with warm water  and peroxide without complication.  TMs visualized and showing middle ear effusions post procedure.  Will treat with IM Decadron , Flonase , antihistamines, Coricidin HBP as needed.  Discussed return precautions.  Final Clinical Impressions(s) / UC Diagnoses   Final diagnoses:  Acute dysfunction of Eustachian tube, bilateral  Bilateral impacted cerumen     Discharge Instructions      We have cleared out the wax impactions to both of your ears today.  We have also given you a steroid shot to help with  your middle ear effusion and congestion.  Continue your Flonase  twice daily, allergy medication, decongestant such as Coricidin HBP.  Follow-up for worsening symptoms    ED Prescriptions   None    PDMP not reviewed this encounter.   Stuart Vernell Norris, [place] 05/14/23 1921

## 2023-05-14 NOTE — ED Triage Notes (Signed)
 Pt states ears feel full with sinus congestion since yesterday.

## 2023-05-25 DIAGNOSIS — E1165 Type 2 diabetes mellitus with hyperglycemia: Secondary | ICD-10-CM | POA: Diagnosis not present

## 2023-05-25 DIAGNOSIS — I10 Essential (primary) hypertension: Secondary | ICD-10-CM | POA: Diagnosis not present

## 2023-05-25 DIAGNOSIS — Z6832 Body mass index (BMI) 32.0-32.9, adult: Secondary | ICD-10-CM | POA: Diagnosis not present

## 2023-05-25 DIAGNOSIS — N400 Enlarged prostate without lower urinary tract symptoms: Secondary | ICD-10-CM | POA: Diagnosis not present

## 2023-05-25 DIAGNOSIS — K7460 Unspecified cirrhosis of liver: Secondary | ICD-10-CM | POA: Diagnosis not present

## 2023-06-08 NOTE — Progress Notes (Unsigned)
 Referring Provider: Donetta Potts, MD Primary Care Physician:  Donetta Potts, MD Primary GI Physician: Dr. Jena Gauss  No chief complaint on file.   HPI:   Mark Leonard is a 73 y.o. male with a history of with history of type 2 diabetes, HTN, adenomatous colon polyps, GERD, chronically elevated liver enzymes since 2013, found to have cirrhosis in March 2024, presenting today for follow-up.    Prior workup for elevated LFTs: ANA, AMA, ASMA, negative.  IgM and IgG within normal limits.  Mild elevation of IgA at 568.  Hep C negative. No immunity to hepatitis B.  Immune to hepatitis A.  Iron panel within normal limits.  TSH normal. -Recommended hepatitis B vaccination.   [place] dated 09/17/2021 showing hepatomegaly (liver 20.4 cm) with steatosis, simple hepatic cyst measuring less than 1 cm, no worrisome masses or lesions, patent portal vein, normal spleen.    CT A/P without contrast 06/08/2022 that showed severe hepatic steatosis with morphologic changes suggesting cirrhosis   Ultrasound with elastography with increased hepatic echogenicity right hepatic cyst measuring 1.1 cm, median K PA 18.4 which was highly suggestive of compensated advanced chronic liver disease.   Last Procedures 09/17/2022:  Colonoscopy: - Six 4 to 6 mm polyps at the hepatic flexure and at the ileocecal valve, removed with a cold snare. Resected and retrieved.  - Diverticulosis in the sigmoid colon and in the descending colon.  - Non- bleeding internal hemorrhoids. Anal papilla also present. -Pathology with tubular adenomas and 1 sessile serrated polyp.  -Recommended 3-year surveillance.   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 - Normal duodenal bulb and second portion of the duodenum. - Dr. Jena Gauss stated, "Given ongoing transaminitis, an indicator of ongoing hepatic parenchymal inflammation, we will likely offer the patient primary prophylaxis with nonselective beta-  blocker when he returns for evaluation in 4 weeks."  Last seen in the office 10/12/22: Clinically, patient was doing well.  No symptoms of decompensated liver disease.  Intentionally down 11 pounds with changing eating habits and walking more.  GERD well-controlled on Nexium 40 mg daily.  Dr. Jena Gauss previously recommended starting an SBP, but patient's blood pressure was soft at 100/67.  He had been started on 2 new blood pressure medications since I had seen him in April.  He had alcoholic appointment with PCP in 2 weeks and recommended discussing possibly replacing amlodipine with beta-blocker for BP management.  Recommended carvedilol 3.125 mg twice daily with goal of increasing to 6.25 mg twice daily if tolerated.  Planned for 57-month follow-up.  Today:  MELD 3.0: 11 on 09/15/22.  [place]: 01/13/2023 with stable 9 mm hypoechoic mass in the right hepatic lobe, potentially a small cyst. AFP: Within normal limits 01/13/2023 Hep A/B vaccination:  Immune to Hep A. Completed Hep B Vaccination in May 2024.  EGD: As per above. BB: *** Ascites/peripheral edema: *** Diuretics: *** Paracentesis: Never  History of SBP: Never Encephalopathy:  ***   GERD:      Past Medical History:  Diagnosis Date   Acid reflux    Allergy    Cirrhosis (HCC)    Natural immunity to hepatitis A.  Completed hepatitis B vaccination May 2024.   COVID 04/2019   stuffy head sinus problems sob fatigue x 2 weeks   Diabetes mellitus without complication (HCC)    Elevated LFTs    since 2013   Fatty liver    Gout    Heart murmur  mild no cardiologist for last 20 yrs    History of kidney stones    Hypertension    Mitral regurgitation    mild    Prostate hypertrophy    Venous stasis    legs brown no vein doctor   Wears glasses     Past Surgical History:  Procedure Laterality Date   CARDIOVASCULAR STRESS TEST     CHOLECYSTECTOMY  late 1990's   COLONOSCOPY     COLONOSCOPY N/A 05/17/2019   Surgeon: Corbin Ade,  MD;  seven 4-6 mm polyps removed.  Pathology with tubular adenomas and 1 hyperplastic polyp.  Recommended 3-year repeat.   COLONOSCOPY WITH PROPOFOL N/A 09/17/2022   Surgeon: Corbin Ade, MD; Six 4-6 mm polyps removed, sigmoid and descending colon diverticulosis, nonbleeding internal hemorrhoids, anal papilla.  Pathology with tubular adenomas and 1 sessile serrated polyp.  Recommended 3 year surveillance.   CYSTOSCOPY WITH LITHOLAPAXY N/A 04/12/2020   Procedure: CYSTOSCOPY WITH  BLADDER AND KIDNEY LITHOLAPAXY;  Surgeon: Sebastian Ache, MD;  Location: [facility];  Service: Urology;  Laterality: N/A;   CYSTOSCOPY WITH RETROGRADE PYELOGRAM, URETEROSCOPY AND STENT PLACEMENT Bilateral 04/12/2020   Procedure: CYSTOSCOPY WITH RETROGRADE PYELOGRAM, URETEROSCOPY AND STENT PLACEMENT;  Surgeon: Sebastian Ache, MD;  Location: [facility];  Service: Urology;  Laterality: Bilateral;   ESOPHAGOGASTRODUODENOSCOPY (EGD) WITH PROPOFOL N/A 09/17/2022   Surgeon: Corbin Ade, MD; 4 columns of grade 1-2 esophageal varices, portal hypertensive gastropathy.   EXTRACORPOREAL SHOCK WAVE LITHOTRIPSY  last done 18 yrs ago   4-5 times   HOLMIUM LASER APPLICATION Bilateral 04/12/2020   Procedure: HOLMIUM LASER APPLICATION;  Surgeon: Sebastian Ache, MD;  Location[facility];  Service: Urology;  Laterality: Bilateral;   KIDNEY STONE SURGERY  1970's done x 2   POLYPECTOMY  05/17/2019   Procedure: POLYPECTOMY;  Surgeon: Corbin Ade, MD;  [facility];  Service: Endoscopy;;   POLYPECTOMY  09/17/2022   Procedure: POLYPECTOMY;  Surgeon: Corbin Ade, MD;  [facility];  Service: Endoscopy;;   VASECTOMY  late 1990's    Current Outpatient Medications  Medication Sig Dispense Refill   acetaminophen (TYLENOL) 650 MG CR tablet Take 650 mg by mouth every 8 (eight) hours as needed for pain.     allopurinol (ZYLOPRIM) 300 MG tablet Take 1 tablet (300  mg total) by mouth daily. 90 tablet 1   blood glucose meter kit and supplies KIT Dispense based on patient and insurance preference. Use daily as directed. (FOR ICD-10 code E11.9) 1 each 5   carvedilol (COREG) 6.25 MG tablet Take 1 tablet (6.25 mg total) by mouth 2 (two) times daily. 60 tablet 5   cetirizine (ZYRTEC ALLERGY) 10 MG tablet Take 1 tablet (10 mg total) by mouth daily. 30 tablet 2   chlorzoxazone (PARAFON) 500 MG tablet Take by mouth as needed (Back spasm).     esomeprazole (NEXIUM) 40 MG capsule TAKE 1 CAPSULE DAILY BEFOREBREAKFAST 90 capsule 1   finasteride (PROSCAR) 5 MG tablet Take 1 tablet (5 mg total) by mouth daily. 90 tablet 3   fluticasone (FLONASE) 50 MCG/ACT nasal spray Place 2 sprays into both nostrils daily. 16 g 0   glipiZIDE (GLUCOTROL) 5 MG tablet Take 5 mg by mouth every morning.     glucose blood test strip Use to test blood sugar once daily. Use as instructed. One touch, verio strips. Dx:E11.9 100 each 12   ibuprofen (ADVIL) 200 MG tablet Take  400 mg by mouth every 6 (six) hours as needed for mild pain or moderate pain.     indapamide (LOZOL) 2.5 MG tablet Take 1 tablet (2.5 mg total) by mouth daily. 30 tablet 11   metronidazole (NORITATE) 1 % cream Apply 1 Application topically every other day. face     tamsulosin (FLOMAX) 0.4 MG CAPS capsule Take 1 capsule (0.4 mg total) by mouth daily. 30 capsule 1   No current facility-administered medications for this visit.    Allergies as of 06/10/2023 - Review Complete 05/14/2023  Allergen Reaction Noted   Tape Other (See Comments) 06/22/2012    Family History  Problem Relation Age of Onset   Hypertension Mother    Pulmonary disease Father    Cirrhosis Maternal Grandfather        unknown etiology, non-alcoholic   Liver cancer Maternal Grandfather    Cirrhosis Cousin        unknown etiology, non-alcoholic   Liver cancer Cousin    Cirrhosis Cousin        alcoholic   Colon cancer Neg Hx     Social History    Socioeconomic History   Marital status: Married    Spouse name: Not on file   Number of children: Not on file   Years of education: Not on file   Highest education level: Not on file  Occupational History   Not on file  Tobacco Use   Smoking status: Former    Current packs/day: 0.00    Average packs/day: 2.0 packs/day for 26.0 years (52.0 ttl pk-yrs)    Types: Cigarettes    Start date: 04/03/1973    Quit date: 04/04/1999    Years since quitting: 24.1   Smokeless tobacco: Former    Quit date: 05/26/1998  Vaping Use   Vaping status: Never Used  Substance and Sexual Activity   Alcohol use: No    Alcohol/week: 0.0 standard drinks of alcohol   Drug use: No   Sexual activity: Not on file  Other Topics Concern   Not on file  Social History Narrative   Not on file   Social Drivers of Health   Financial Resource Strain: Not on file  Food Insecurity: No Food Insecurity (06/09/2022)   Hunger Vital Sign    Worried About Running Out of Food in the Last Year: Never true    Ran Out of Food in the Last Year: Never true  Transportation Needs: No Transportation Needs (06/09/2022)   PRAPARE - Administrator, Civil Service (Medical): No    Lack of Transportation (Non-Medical): No  Physical Activity: Not on file  Stress: Not on file  Social Connections: Not on file    Review of Systems: Gen: Denies fever, chills, anorexia. Denies fatigue, weakness, weight loss.  CV: Denies chest pain, palpitations, syncope, peripheral edema, and claudication. Resp: Denies dyspnea at rest, cough, wheezing, coughing up blood, and pleurisy. GI: Denies vomiting blood, jaundice, and fecal incontinence.   Denies dysphagia or odynophagia. Derm: Denies rash, itching, dry skin Psych: Denies depression, anxiety, memory loss, confusion. No homicidal or suicidal ideation.  Heme: Denies bruising, bleeding, and enlarged lymph nodes.  Physical Exam: There were no vitals taken for this visit. General:    Alert and oriented. No distress noted. Pleasant and cooperative.  Head:  Normocephalic and atraumatic. Eyes:  Conjuctiva clear without scleral icterus. Heart:  S1, S2 present without murmurs appreciated. Lungs:  Clear to auscultation bilaterally. No wheezes, rales, or rhonchi. No distress.  Abdomen:  +BS, soft, non-tender and non-distended. No rebound or guarding. No HSM or masses noted. Msk:  Symmetrical without gross deformities. Normal posture. Extremities:  Without edema. Neurologic:  Alert and  oriented x4 Psych:  Normal mood and affect.    Assessment:     Plan:  ***   Ermalinda Memos, PA-C [facility] 06/10/2023

## 2023-06-10 ENCOUNTER — Encounter: Payer: Self-pay | Admitting: Gastroenterology

## 2023-06-10 ENCOUNTER — Other Ambulatory Visit: Payer: Self-pay | Admitting: *Deleted

## 2023-06-10 ENCOUNTER — Ambulatory Visit: Payer: HMO | Admitting: Gastroenterology

## 2023-06-10 ENCOUNTER — Encounter: Payer: Self-pay | Admitting: *Deleted

## 2023-06-10 VITALS — BP 109/68 | HR 69 | Temp 97.9°F | Ht 67.0 in | Wt 209.6 lb

## 2023-06-10 DIAGNOSIS — K7469 Other cirrhosis of liver: Secondary | ICD-10-CM

## 2023-06-10 DIAGNOSIS — K7581 Nonalcoholic steatohepatitis (NASH): Secondary | ICD-10-CM | POA: Diagnosis not present

## 2023-06-10 DIAGNOSIS — K7460 Unspecified cirrhosis of liver: Secondary | ICD-10-CM | POA: Diagnosis not present

## 2023-06-10 DIAGNOSIS — I8500 Esophageal varices without bleeding: Secondary | ICD-10-CM

## 2023-06-10 DIAGNOSIS — K219 Gastro-esophageal reflux disease without esophagitis: Secondary | ICD-10-CM

## 2023-06-10 DIAGNOSIS — K3189 Other diseases of stomach and duodenum: Secondary | ICD-10-CM | POA: Diagnosis not present

## 2023-06-10 NOTE — Patient Instructions (Signed)
 Please have labs completed with your primary care doctor and ensure that they fax the results back to [place].  Will be scheduled for an ultrasound of your liver in April.  Continue taking Nexium 40 mg daily.  Continue taking carvedilol 6.25 mg twice daily.   I will plan to see you back in 6 months or sooner if needed.  Ermalinda Memos, PA-C [facility]

## 2023-07-14 ENCOUNTER — Ambulatory Visit (HOSPITAL_COMMUNITY)
Admission: RE | Admit: 2023-07-14 | Discharge: 2023-07-14 | Disposition: A | Discharge status: Home / Self Care | Source: Ambulatory Visit | Attending: Gastroenterology | Admitting: Gastroenterology

## 2023-07-14 DIAGNOSIS — K7689 Other specified diseases of liver: Secondary | ICD-10-CM | POA: Diagnosis not present

## 2023-07-14 DIAGNOSIS — K7469 Other cirrhosis of liver: Secondary | ICD-10-CM | POA: Insufficient documentation

## 2023-07-14 DIAGNOSIS — Z9049 Acquired absence of other specified parts of digestive tract: Secondary | ICD-10-CM | POA: Diagnosis not present

## 2023-07-14 DIAGNOSIS — K7460 Unspecified cirrhosis of liver: Secondary | ICD-10-CM | POA: Diagnosis not present

## 2023-08-10 DIAGNOSIS — E559 Vitamin D deficiency, unspecified: Secondary | ICD-10-CM | POA: Diagnosis not present

## 2023-08-10 DIAGNOSIS — Z1159 Encounter for screening for other viral diseases: Secondary | ICD-10-CM | POA: Diagnosis not present

## 2023-08-10 DIAGNOSIS — E1165 Type 2 diabetes mellitus with hyperglycemia: Secondary | ICD-10-CM | POA: Diagnosis not present

## 2023-08-10 DIAGNOSIS — D559 Anemia due to enzyme disorder, unspecified: Secondary | ICD-10-CM | POA: Diagnosis not present

## 2023-08-10 DIAGNOSIS — Z1329 Encounter for screening for other suspected endocrine disorder: Secondary | ICD-10-CM | POA: Diagnosis not present

## 2023-08-10 DIAGNOSIS — K7469 Other cirrhosis of liver: Secondary | ICD-10-CM | POA: Diagnosis not present

## 2023-08-10 DIAGNOSIS — I10 Essential (primary) hypertension: Secondary | ICD-10-CM | POA: Diagnosis not present

## 2023-08-10 DIAGNOSIS — Z13220 Encounter for screening for lipoid disorders: Secondary | ICD-10-CM | POA: Diagnosis not present

## 2023-08-17 DIAGNOSIS — I10 Essential (primary) hypertension: Secondary | ICD-10-CM | POA: Diagnosis not present

## 2023-08-17 DIAGNOSIS — Z1331 Encounter for screening for depression: Secondary | ICD-10-CM | POA: Diagnosis not present

## 2023-08-17 DIAGNOSIS — Z1389 Encounter for screening for other disorder: Secondary | ICD-10-CM | POA: Diagnosis not present

## 2023-08-17 DIAGNOSIS — E1165 Type 2 diabetes mellitus with hyperglycemia: Secondary | ICD-10-CM | POA: Diagnosis not present

## 2023-08-17 DIAGNOSIS — K7460 Unspecified cirrhosis of liver: Secondary | ICD-10-CM | POA: Diagnosis not present

## 2023-08-17 DIAGNOSIS — N400 Enlarged prostate without lower urinary tract symptoms: Secondary | ICD-10-CM | POA: Diagnosis not present

## 2023-08-17 DIAGNOSIS — Z0000 Encounter for general adult medical examination without abnormal findings: Secondary | ICD-10-CM | POA: Diagnosis not present

## 2023-08-17 DIAGNOSIS — Z6832 Body mass index (BMI) 32.0-32.9, adult: Secondary | ICD-10-CM | POA: Diagnosis not present

## 2023-08-17 DIAGNOSIS — Z0001 Encounter for general adult medical examination with abnormal findings: Secondary | ICD-10-CM | POA: Diagnosis not present

## 2023-08-19 ENCOUNTER — Telehealth: Payer: Self-pay | Admitting: Gastroenterology

## 2023-08-19 NOTE — Telephone Encounter (Signed)
 Please let patient know I received and reviewed labs from PCP dated 08/10/23.  Labs are stable. LFTs, INR, AFP all wnl. Platelets low at 121 ,but this is stable. MELD 3.0 remains low at 9 which is great.

## 2023-08-20 ENCOUNTER — Encounter: Payer: Self-pay | Admitting: *Deleted

## 2023-08-20 NOTE — Telephone Encounter (Signed)
 Sent pt a [facility]ologist. He replied

## 2023-09-15 DIAGNOSIS — I10 Essential (primary) hypertension: Secondary | ICD-10-CM | POA: Diagnosis not present

## 2023-09-15 DIAGNOSIS — E1165 Type 2 diabetes mellitus with hyperglycemia: Secondary | ICD-10-CM | POA: Diagnosis not present

## 2023-09-15 DIAGNOSIS — N400 Enlarged prostate without lower urinary tract symptoms: Secondary | ICD-10-CM | POA: Diagnosis not present

## 2023-09-15 DIAGNOSIS — K7460 Unspecified cirrhosis of liver: Secondary | ICD-10-CM | POA: Diagnosis not present

## 2023-09-15 DIAGNOSIS — Z6832 Body mass index (BMI) 32.0-32.9, adult: Secondary | ICD-10-CM | POA: Diagnosis not present

## 2023-10-14 ENCOUNTER — Encounter: Payer: Self-pay | Admitting: Urology

## 2023-10-14 ENCOUNTER — Ambulatory Visit: Admitting: Urology

## 2023-10-14 VITALS — BP 120/82 | HR 93

## 2023-10-14 DIAGNOSIS — Z87442 Personal history of urinary calculi: Secondary | ICD-10-CM | POA: Diagnosis not present

## 2023-10-14 DIAGNOSIS — N401 Enlarged prostate with lower urinary tract symptoms: Secondary | ICD-10-CM

## 2023-10-14 DIAGNOSIS — N400 Enlarged prostate without lower urinary tract symptoms: Secondary | ICD-10-CM

## 2023-10-14 DIAGNOSIS — N138 Other obstructive and reflux uropathy: Secondary | ICD-10-CM

## 2023-10-14 LAB — BLADDER SCAN AMB NON-IMAGING: Scan Result: 1

## 2023-10-14 MED ORDER — FINASTERIDE 5 MG PO TABS: 5.0000 mg | ORAL_TABLET | Freq: Every day | ORAL | 3 refills | Status: DC

## 2023-10-14 MED ORDER — INDAPAMIDE 2.5 MG PO TABS: 2.5000 mg | ORAL_TABLET | Freq: Every day | ORAL | 3 refills | Status: DC

## 2023-10-14 NOTE — Progress Notes (Signed)
 Name: Mark Leonard DOB: 03/03/1951 MRN: [id number]  History of Present Illness: Mark Leonard is a 73 y.o. male who presents today for follow up visit at [facility]. Relevant History includes: 1. BPH.  - PSA has been normal (0.3 on 08/12/2022). - Taking Proscar  (Finasteride ) 5 mg daily. 2. Kidney stones, recurrent. - 04/12/2020: Ureteroscopic stone manipulation by Dr. Alvaro. - 03/09/2023: Normal RUS with no GU stones, masses, or hydronephrosis; bladder unremarkable. - Taking Indapamide  2.5 mg daily for stone prevention. - Takes Flomax  0.4 mg PRN for stone symptoms.  At last visit with Dr. Sherrilee on 03/19/2023: Doing well.   Today: He reports that about 1 month ago he think he may have had a stone migrating - he experienced a brief episode of left flank pain along with one episode of visible blood in his urine. Those symptoms resolved and have not recurred since then. He reports today that as recently as yesterday he has had mild bladder irritation; to date he has not seen a stone pass in his urine. Today he reports being asymptomatic - denies flank pain, abdominal pain, fevers, dysuria, or gross hematuria.   He denies increased urinary urgency, frequency, nocturia, hesitancy, straining to void, or sensations of incomplete emptying.   Medications: Current Outpatient Medications  Medication Sig Dispense Refill   acetaminophen  (TYLENOL ) 650 MG CR tablet Take 650 mg by mouth every 8 (eight) hours as needed for pain.     allopurinol  (ZYLOPRIM ) 300 MG tablet Take 1 tablet (300 mg total) by mouth daily. 90 tablet 1   blood glucose meter kit and supplies KIT Dispense based on patient and insurance preference. Use daily as directed. (FOR ICD-10 code E11.9) 1 each 5   carvedilol  (COREG ) 6.25 MG tablet Take 1 tablet (6.25 mg total) by mouth 2 (two) times daily. 60 tablet 5   cetirizine  (ZYRTEC  ALLERGY) 10 MG tablet Take 1 tablet (10 mg total) by mouth daily. 30 tablet 2    chlorzoxazone  (PARAFON ) 500 MG tablet Take by mouth as needed (Back spasm).     esomeprazole  (NEXIUM ) 40 MG capsule TAKE 1 CAPSULE DAILY BEFOREBREAKFAST 90 capsule 1   fluticasone  (FLONASE ) 50 MCG/ACT nasal spray Place 2 sprays into both nostrils daily. 16 g 0   glipiZIDE (GLUCOTROL) 5 MG tablet Take 2.5 mg by mouth every morning.     glucose blood test strip Use to test blood sugar once daily. Use as instructed. One touch, verio strips. Dx:E11.9 100 each 12   LANTUS SOLOSTAR 100 UNIT/ML Solostar Pen Inject 10 Units into the skin daily. (Patient taking differently: Inject 10 Units into the skin daily. Take 8 units)     finasteride  (PROSCAR ) 5 MG tablet Take 1 tablet (5 mg total) by mouth daily. 90 tablet 3   ibuprofen (ADVIL) 200 MG tablet Take 400 mg by mouth every 6 (six) hours as needed for mild pain or moderate pain. (Patient not taking: Reported on 10/14/2023)     indapamide  (LOZOL ) 2.5 MG tablet Take 1 tablet (2.5 mg total) by mouth daily. 90 tablet 3   No current facility-administered medications for this visit.    Allergies: Allergies  Allergen Reactions   Tape Other (See Comments)    Adhesive Doris    Past Medical History:  Diagnosis Date   Acid reflux    Allergy    Cirrhosis (HCC)    Natural immunity to hepatitis A.  Completed hepatitis B vaccination May 2024.   COVID 04/2019   stuffy  head sinus problems sob fatigue x 2 weeks   Diabetes mellitus without complication (HCC)    Elevated LFTs    since 2013   Fatty liver    Gout    Heart murmur    mild no cardiologist for last 20 yrs    History of kidney stones    Hypertension    Mitral regurgitation    mild    Prostate hypertrophy    Venous stasis    legs brown no vein doctor   Wears glasses    Past Surgical History:  Procedure Laterality Date   CARDIOVASCULAR STRESS TEST     CHOLECYSTECTOMY  late 1990's   COLONOSCOPY     COLONOSCOPY N/A 05/17/2019   Surgeon: Shaaron Lamar HERO, MD;  seven 4-6 mm polyps removed.   Pathology with tubular adenomas and 1 hyperplastic polyp.  Recommended 3-year repeat.   COLONOSCOPY WITH PROPOFOL  N/A 09/17/2022   Surgeon: Shaaron Lamar HERO, MD; Six 4-6 mm polyps removed, sigmoid and descending colon diverticulosis, nonbleeding internal hemorrhoids, anal papilla.  Pathology with tubular adenomas and 1 sessile serrated polyp.  Recommended 3 year surveillance.   CYSTOSCOPY WITH LITHOLAPAXY N/A 04/12/2020   Procedure: CYSTOSCOPY WITH  BLADDER AND KIDNEY LITHOLAPAXY;  Surgeon: Alvaro Hummer, MD;  Location: [facility];  Service: Urology;  Laterality: N/A;   CYSTOSCOPY WITH RETROGRADE PYELOGRAM, URETEROSCOPY AND STENT PLACEMENT Bilateral 04/12/2020   Procedure: CYSTOSCOPY WITH RETROGRADE PYELOGRAM, URETEROSCOPY AND STENT PLACEMENT;  Surgeon: Alvaro Hummer, MD;  Location: [facility];  Service: Urology;  Laterality: Bilateral;   ESOPHAGOGASTRODUODENOSCOPY (EGD) WITH PROPOFOL  N/A 09/17/2022   Surgeon: Shaaron Lamar HERO, MD; 4 columns of grade 1-2 esophageal varices, portal hypertensive gastropathy.   EXTRACORPOREAL SHOCK WAVE LITHOTRIPSY  last done 18 yrs ago   4-5 times   HOLMIUM LASER APPLICATION Bilateral 04/12/2020   Procedure: HOLMIUM LASER APPLICATION;  Surgeon: Alvaro Hummer, MD;  Location: [facility];  Service: Urology;  Laterality: Bilateral;   KIDNEY STONE SURGERY  1970's done x 2   POLYPECTOMY  05/17/2019   Procedure: POLYPECTOMY;  Surgeon: Shaaron Lamar HERO, MD;  [facility];  Service: Endoscopy;;   POLYPECTOMY  09/17/2022   Procedure: POLYPECTOMY;  Surgeon: Shaaron Lamar HERO, MD;  [facility];  Service: Endoscopy;;   VASECTOMY  late 1990's   Family History  Problem Relation Age of Onset   Hypertension Mother    Pulmonary disease Father    Cirrhosis Maternal Grandfather        unknown etiology, non-alcoholic   Liver cancer Maternal Grandfather    Cirrhosis Cousin        unknown etiology,  non-alcoholic   Liver cancer Cousin    Cirrhosis Cousin        alcoholic   Colon cancer Neg Hx    Social History   Socioeconomic History   Marital status: Married    Spouse name: Not on file   Number of children: Not on file   Years of education: Not on file   Highest education level: Not on file  Occupational History   Not on file  Tobacco Use   Smoking status: Former    Current packs/day: 0.00    Average packs/day: 2.0 packs/day for 26.0 years (52.0 ttl pk-yrs)    Types: Cigarettes    Start date: 04/03/1973    Quit date: 04/04/1999    Years since quitting: 24.5   Smokeless tobacco: Former    Quit date: 05/26/1998  Vaping Use   Vaping status: Never Used  Substance and Sexual Activity   Alcohol  use: No    Alcohol /week: 0.0 standard drinks of alcohol    Drug use: No   Sexual activity: Not on file  Other Topics Concern   Not on file  Social History Narrative   Not on file   Social Drivers of Health   Financial Resource Strain: Not on file  Food Insecurity: No Food Insecurity (06/09/2022)   Hunger Vital Sign    Worried About Running Out of Food in the Last Year: Never true    Ran Out of Food in the Last Year: Never true  Transportation Needs: No Transportation Needs (06/09/2022)   PRAPARE - Administrator, Civil Service (Medical): No    Lack of Transportation (Non-Medical): No  Physical Activity: Not on file  Stress: Not on file  Social Connections: Not on file  Intimate Partner Violence: Not At Risk (06/09/2022)   Humiliation, Afraid, Rape, and Kick questionnaire    Fear of Current or Ex-Partner: No    Emotionally Abused: No    Physically Abused: No    Sexually Abused: No    SUBJECTIVE  Review of Systems Constitutional: Patient denies any unintentional weight loss or change in strength lntegumentary: Patient denies any rashes or pruritus Cardiovascular: Patient denies chest pain or syncope Respiratory: Patient denies shortness of  breath Gastrointestinal: Patient denies nausea, vomiting, constipation, or diarrhea  Musculoskeletal: Patient denies muscle cramps or weakness Neurologic: Patient denies convulsions or seizures Allergic/Immunologic: Patient denies recent allergic reaction(s) Hematologic/Lymphatic: Patient denies bleeding tendencies Endocrine: Patient denies heat/cold intolerance  GU: As per HPI.  OBJECTIVE Vitals:   10/14/23 1519  BP: 120/82  Pulse: 93   There is no height or weight on file to calculate BMI.  Physical Examination Constitutional: No obvious distress; patient is non-toxic appearing  Cardiovascular: No visible lower extremity edema.  Respiratory: The patient does not have audible wheezing/stridor; respirations do not appear labored  Gastrointestinal: Abdomen non-distended Musculoskeletal: Normal ROM of UEs  Skin: No obvious rashes/open sores  Neurologic: CN 2-12 grossly intact Psychiatric: Answered questions appropriately with normal affect  Hematologic/Lymphatic/Immunologic: No obvious bruises or sites of spontaneous bleeding  UA: negative  PVR: 1 ml  ASSESSMENT History of kidney stones - Plan: BLADDER SCAN AMB NON-IMAGING, Urinalysis, Routine w reflex microscopic, indapamide  (LOZOL ) 2.5 MG tablet  Benign prostatic hyperplasia with urinary obstruction - Plan: BLADDER SCAN AMB NON-IMAGING, Urinalysis, Routine w reflex microscopic, finasteride  (PROSCAR ) 5 MG tablet  Prostate hypertrophy  He is asymptomatic at this time with no acute findings. Discussed option to get a KUB to assess current stone burden; he elected to hold off since he's asymptomatic. Refills sent today. Will plan for follow up as previously scheduled with Dr. Sherrilee on 03/22/2024 with RUS for stone surveilllance & PSA recheck. Pt verbalized understanding and agreement. All questions were answered.   PLAN Advised the following: Continue Proscar  (Finasteride ) 5 mg daily. Continue Indapamide  2.5 mg  daily. Continue Flomax  0.4 mg PRN for stone symptoms. Return in 5 months (on 03/22/2024) for f/u as previously scheduled with Dr. Sherrilee, will need RUS prior.  Orders Placed This Encounter  Procedures   Urinalysis, Routine w reflex microscopic   BLADDER SCAN AMB NON-IMAGING    It has been explained that the patient is to follow regularly with their PCP in addition to all other providers involved in their care and to follow instructions provided by these respective offices. Patient advised to contact  urology clinic if any urologic-pertaining questions, concerns, new symptoms or problems arise in the interim period.  There are no Patient Instructions on file for this visit.  Electronically signed by:  Lauraine KYM Oz, MSN, FNP-C, CUNP 10/14/2023 3:59 PM

## 2023-10-15 LAB — URINALYSIS, ROUTINE W REFLEX MICROSCOPIC
Bilirubin, UA: NEGATIVE
Glucose, UA: NEGATIVE
Ketones, UA: NEGATIVE
Leukocytes,UA: NEGATIVE
Nitrite, UA: NEGATIVE
Protein,UA: NEGATIVE
RBC, UA: NEGATIVE
Specific Gravity, UA: 1.015 (ref 1.005–1.030)
Urobilinogen, Ur: 0.2 mg/dL (ref 0.2–1.0)
pH, UA: 6 (ref 5.0–7.5)

## 2023-11-17 DIAGNOSIS — I8500 Esophageal varices without bleeding: Secondary | ICD-10-CM | POA: Diagnosis not present

## 2023-11-17 DIAGNOSIS — Z1329 Encounter for screening for other suspected endocrine disorder: Secondary | ICD-10-CM | POA: Diagnosis not present

## 2023-11-17 DIAGNOSIS — L821 Other seborrheic keratosis: Secondary | ICD-10-CM | POA: Diagnosis not present

## 2023-11-17 DIAGNOSIS — L570 Actinic keratosis: Secondary | ICD-10-CM | POA: Diagnosis not present

## 2023-11-17 DIAGNOSIS — Z13220 Encounter for screening for lipoid disorders: Secondary | ICD-10-CM | POA: Diagnosis not present

## 2023-11-17 DIAGNOSIS — E1165 Type 2 diabetes mellitus with hyperglycemia: Secondary | ICD-10-CM | POA: Diagnosis not present

## 2023-11-17 DIAGNOSIS — L814 Other melanin hyperpigmentation: Secondary | ICD-10-CM | POA: Diagnosis not present

## 2023-11-17 DIAGNOSIS — I10 Essential (primary) hypertension: Secondary | ICD-10-CM | POA: Diagnosis not present

## 2023-11-17 DIAGNOSIS — L573 Poikiloderma of Civatte: Secondary | ICD-10-CM | POA: Diagnosis not present

## 2023-11-22 DIAGNOSIS — K7460 Unspecified cirrhosis of liver: Secondary | ICD-10-CM | POA: Diagnosis not present

## 2023-11-22 DIAGNOSIS — Z6833 Body mass index (BMI) 33.0-33.9, adult: Secondary | ICD-10-CM | POA: Diagnosis not present

## 2023-11-22 DIAGNOSIS — I10 Essential (primary) hypertension: Secondary | ICD-10-CM | POA: Diagnosis not present

## 2023-11-22 DIAGNOSIS — N400 Enlarged prostate without lower urinary tract symptoms: Secondary | ICD-10-CM | POA: Diagnosis not present

## 2023-11-22 DIAGNOSIS — E1165 Type 2 diabetes mellitus with hyperglycemia: Secondary | ICD-10-CM | POA: Diagnosis not present

## 2023-12-07 ENCOUNTER — Telehealth: Payer: Self-pay | Admitting: *Deleted

## 2023-12-07 NOTE — Telephone Encounter (Signed)
 Pt left vm stating he needed to schedule an ultrasound. I do not see anything on recall. Please advise. Thank you

## 2023-12-07 NOTE — Telephone Encounter (Signed)
 noted

## 2023-12-07 NOTE — Telephone Encounter (Signed)
 He is due for an OV in September and will be due for an ultrasound in October. This can be arranged at the follow-up appointment which still needs to be scheduled.   Ladonna: Patient is due for cirrhosis follow-up.

## 2023-12-13 DIAGNOSIS — K7460 Unspecified cirrhosis of liver: Secondary | ICD-10-CM | POA: Diagnosis not present

## 2023-12-13 DIAGNOSIS — Z6832 Body mass index (BMI) 32.0-32.9, adult: Secondary | ICD-10-CM | POA: Diagnosis not present

## 2023-12-13 DIAGNOSIS — I10 Essential (primary) hypertension: Secondary | ICD-10-CM | POA: Diagnosis not present

## 2023-12-13 DIAGNOSIS — E1165 Type 2 diabetes mellitus with hyperglycemia: Secondary | ICD-10-CM | POA: Diagnosis not present

## 2023-12-13 DIAGNOSIS — N400 Enlarged prostate without lower urinary tract symptoms: Secondary | ICD-10-CM | POA: Diagnosis not present

## 2023-12-31 DIAGNOSIS — Z1329 Encounter for screening for other suspected endocrine disorder: Secondary | ICD-10-CM | POA: Diagnosis not present

## 2024-01-04 DIAGNOSIS — I10 Essential (primary) hypertension: Secondary | ICD-10-CM | POA: Diagnosis not present

## 2024-01-04 DIAGNOSIS — N400 Enlarged prostate without lower urinary tract symptoms: Secondary | ICD-10-CM | POA: Diagnosis not present

## 2024-01-04 DIAGNOSIS — Z6833 Body mass index (BMI) 33.0-33.9, adult: Secondary | ICD-10-CM | POA: Diagnosis not present

## 2024-01-04 DIAGNOSIS — K7460 Unspecified cirrhosis of liver: Secondary | ICD-10-CM | POA: Diagnosis not present

## 2024-01-04 DIAGNOSIS — Z23 Encounter for immunization: Secondary | ICD-10-CM | POA: Diagnosis not present

## 2024-01-04 DIAGNOSIS — E1165 Type 2 diabetes mellitus with hyperglycemia: Secondary | ICD-10-CM | POA: Diagnosis not present

## 2024-01-10 ENCOUNTER — Other Ambulatory Visit: Payer: Self-pay | Admitting: *Deleted

## 2024-01-10 ENCOUNTER — Ambulatory Visit: Admitting: Internal Medicine

## 2024-01-10 ENCOUNTER — Encounter: Payer: Self-pay | Admitting: Internal Medicine

## 2024-01-10 ENCOUNTER — Encounter: Payer: Self-pay | Admitting: *Deleted

## 2024-01-10 VITALS — BP 136/75 | HR 74 | Temp 98.3°F | Ht 67.0 in | Wt 211.8 lb

## 2024-01-10 DIAGNOSIS — K219 Gastro-esophageal reflux disease without esophagitis: Secondary | ICD-10-CM | POA: Diagnosis not present

## 2024-01-10 DIAGNOSIS — Z860101 Personal history of adenomatous and serrated colon polyps: Secondary | ICD-10-CM | POA: Diagnosis not present

## 2024-01-10 DIAGNOSIS — K766 Portal hypertension: Secondary | ICD-10-CM | POA: Diagnosis not present

## 2024-01-10 DIAGNOSIS — K760 Fatty (change of) liver, not elsewhere classified: Secondary | ICD-10-CM

## 2024-01-10 DIAGNOSIS — K7469 Other cirrhosis of liver: Secondary | ICD-10-CM

## 2024-01-10 DIAGNOSIS — Z860100 Personal history of colon polyps, unspecified: Secondary | ICD-10-CM

## 2024-01-10 NOTE — Patient Instructions (Addendum)
 It was good to see you again today  As discussed colonoscopy plan for 2 years from now  We will update blood work c-Met, CBC INR  Ultrasound of your liver every 6 months  Watch the salt intake in your diet 2 g limit daily  Continue Coreg  every day  Continue esomeprazole  daily for reflux.  Plan to see you back in about 9 months  Further recommendations to follow.

## 2024-01-10 NOTE — Addendum Note (Signed)
 Addended by: WELLINGTON MILLING on: 01/10/2024 09:28 AM   Modules accepted: Orders

## 2024-01-10 NOTE — Progress Notes (Signed)
 Gastroenterology Progress Note    Primary Care Physician:  Trudy Vaughn FALCON, MD Primary Gastroenterologist:  Dr. Shaaron  Pre-Procedure History & Physical: HPI:  Mark Leonard is a 73 y.o. 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  Now on Coreg .  History of multiple adenomas removed 2024; due for surveillance in 2 years from now.  He completed hepatitis B vaccination.  He has never consume alcohol . Omeprazole 40 mg and controlling reflux very well.  He is due for updated labs and a screening hepatic ultrasound.  Past Medical History:  Diagnosis Date   Acid reflux    Allergy    Cirrhosis (HCC)    Natural immunity to hepatitis A.  Completed hepatitis B vaccination May 2024.   COVID 04/2019   stuffy head sinus problems sob fatigue x 2 weeks   Diabetes mellitus without complication (HCC)    Elevated LFTs    since 2013   Fatty liver    Gout    Heart murmur    mild no cardiologist for last 20 yrs    History of kidney stones    Hypertension    Mitral regurgitation    mild    Prostate hypertrophy    Venous stasis    legs brown no vein doctor   Wears glasses     Past Surgical History:  Procedure Laterality Date   CARDIOVASCULAR STRESS TEST     CHOLECYSTECTOMY  late 1990's   COLONOSCOPY     COLONOSCOPY N/A 05/17/2019   Surgeon: Shaaron Lamar HERO, MD;  seven 4-6 mm polyps removed.  Pathology with tubular adenomas and 1 hyperplastic polyp.  Recommended 3-year repeat.   COLONOSCOPY WITH PROPOFOL  N/A 09/17/2022   Surgeon: Shaaron Lamar HERO, MD; Six 4-6 mm polyps removed, sigmoid and descending colon diverticulosis, nonbleeding internal hemorrhoids, anal papilla.  Pathology with tubular adenomas and 1 sessile serrated polyp.  Recommended 3 year surveillance.   CYSTOSCOPY WITH LITHOLAPAXY N/A 04/12/2020   Procedure: CYSTOSCOPY WITH  BLADDER AND KIDNEY LITHOLAPAXY;  Surgeon: Alvaro Hummer, MD;  Location: [facility];  Service: Urology;  Laterality: N/A;   CYSTOSCOPY WITH RETROGRADE PYELOGRAM, URETEROSCOPY AND STENT PLACEMENT Bilateral 04/12/2020   Procedure: CYSTOSCOPY WITH RETROGRADE PYELOGRAM, URETEROSCOPY AND STENT PLACEMENT;  Surgeon: Alvaro Hummer, MD;  Location: [facility];  Service: Urology;  Laterality: Bilateral;   ESOPHAGOGASTRODUODENOSCOPY (EGD) WITH PROPOFOL  N/A 09/17/2022   Surgeon: Shaaron Lamar HERO, MD; 4 columns of grade 1-2 esophageal varices, portal hypertensive gastropathy.   EXTRACORPOREAL SHOCK WAVE LITHOTRIPSY  last done 18 yrs ago   4-5 times   HOLMIUM LASER APPLICATION Bilateral 04/12/2020   Procedure: HOLMIUM LASER APPLICATION;  Surgeon: Alvaro Hummer, MD;  Location[facility];  Service: Urology;  Laterality: Bilateral;   KIDNEY STONE SURGERY  1970's done x 2   POLYPECTOMY  05/17/2019   Procedure: POLYPECTOMY;  Surgeon: Shaaron Lamar HERO, MD;  [facility];  Service: Endoscopy;;   POLYPECTOMY  09/17/2022   Procedure: POLYPECTOMY;  Surgeon: Shaaron Lamar HERO, MD;  [facility];  Service: Endoscopy;;   VASECTOMY  late 1990's    Prior to Admission medications   Medication Sig Start Date End Date Taking? Authorizing Provider  acetaminophen  (TYLENOL ) 650 MG CR tablet Take 650 mg by mouth every 8 (eight) hours as needed for pain.   Yes [provider]  allopurinol  (ZYLOPRIM ) 300 MG tablet Take 1 tablet (300 mg total) by  mouth daily. 07/29/20  Yes Waddell, Malena M, DO  blood glucose meter kit and supplies KIT Dispense based on patient and insurance preference. Use daily as directed. (FOR ICD-10 code E11.9) 07/06/17  Yes Alphonsa Elsie RAMAN, MD  carvedilol  (COREG ) 6.25 MG tablet Take 1 tablet (6.25 mg total) by mouth 2 (two) times daily. 12/11/22 01/10/24 Yes Rudy Josette RAMAN, PA-C  cetirizine  (ZYRTEC  ALLERGY) 10 MG tablet Take 1 tablet (10 mg total) by mouth daily. 10/22/22  Yes Stuart Vernell Norris, PA-C  chlorzoxazone   (PARAFON ) 500 MG tablet Take by mouth as needed (Back spasm).   Yes [provider]  esomeprazole  (NEXIUM ) 40 MG capsule TAKE 1 CAPSULE DAILY BEFOREBREAKFAST 07/29/20  Yes Waddell, Malena M, DO  finasteride  (PROSCAR ) 5 MG tablet Take 1 tablet (5 mg total) by mouth daily. 10/14/23  Yes Gerldine Lauraine BROCKS, FNP  fluticasone  (FLONASE ) 50 MCG/ACT nasal spray Place 2 sprays into both nostrils daily. 02/13/22  Yes Leath-Warren, Etta PARAS, NP  glipiZIDE (GLUCOTROL) 5 MG tablet Take 5 mg by mouth every morning. 03/10/23  Yes [provider]  glucose blood test strip Use to test blood sugar once daily. Use as instructed. One touch, verio strips. Dx:E11.9 01/29/20  Yes Waddell, Malena M, DO  ibuprofen (ADVIL) 200 MG tablet Take 400 mg by mouth every 6 (six) hours as needed for mild pain or moderate pain.   Yes [provider]  indapamide  (LOZOL ) 2.5 MG tablet Take 1 tablet (2.5 mg total) by mouth daily. 10/14/23  Yes Gerldine Lauraine BROCKS, FNP  LANTUS SOLOSTAR 100 UNIT/ML Solostar Pen Inject 14 Units into the skin daily. 02/15/23  Yes [provider]    Allergies as of 01/10/2024 - Review Complete 01/10/2024  Allergen Reaction Noted   Tape Other (See Comments) 06/22/2012    Family History  Problem Relation Age of Onset   Hypertension Mother    Pulmonary disease Father    Cirrhosis Maternal Grandfather        unknown etiology, non-alcoholic   Liver cancer Maternal Grandfather    Cirrhosis Cousin        unknown etiology, non-alcoholic   Liver cancer Cousin    Cirrhosis Cousin        alcoholic   Colon cancer Neg Hx     Social History   Socioeconomic History   Marital status: Married    Spouse name: Not on file   Number of children: Not on file   Years of education: Not on file   Highest education level: Not on file  Occupational History   Not on file  Tobacco Use   Smoking status: Former    Current packs/day: 0.00    Average packs/day: 2.0 packs/day for 26.0  years (52.0 ttl pk-yrs)    Types: Cigarettes    Start date: 04/03/1973    Quit date: 04/04/1999    Years since quitting: 24.7   Smokeless tobacco: Former    Quit date: 05/26/1998  Vaping Use   Vaping status: Never Used  Substance and Sexual Activity   Alcohol  use: No    Alcohol /week: 0.0 standard drinks of alcohol    Drug use: No   Sexual activity: Not on file  Other Topics Concern   Not on file  Social History Narrative   Not on file   Social Drivers of Health   Financial Resource Strain: Not on file  Food Insecurity: No Food Insecurity (06/09/2022)   Hunger Vital Sign    Worried About Running Out of Food  in the Last Year: Never true    Ran Out of Food in the Last Year: Never true  Transportation Needs: No Transportation Needs (06/09/2022)   PRAPARE - Administrator, Civil Service (Medical): No    Lack of Transportation (Non-Medical): No  Physical Activity: Not on file  Stress: Not on file  Social Connections: Not on file  Intimate Partner Violence: Not At Risk (06/09/2022)   Humiliation, Afraid, Rape, and Kick questionnaire    Fear of Current or Ex-Partner: No    Emotionally Abused: No    Physically Abused: No    Sexually Abused: No    Review of Systems   See HPI, otherwise negative ROS  Physical Exam: BP 136/75 (BP Location: Right Arm, Patient Position: Sitting, Cuff Size: Large)   Pulse 74   Temp 98.3 F (36.8 C) (Oral)   Ht 5' 7 (1.702 m)   Wt 211 lb 12.8 oz (96.1 kg)   SpO2 96%   BMI 33.17 kg/m  General:   Alert,   well-nourished, pleasant and cooperative in NAD Neck:  Supple; no masses or thyromegaly. No significant cervical adenopathy. Lungs:  Clear throughout to auscultation.   No wheezes, crackles, or rhonchi. No acute distress. Heart:  Regular rate and rhythm; no murmurs, clicks, rubs,  or gallops. Abdomen: Non-distended, normal bowel sounds.  Soft and nontender without appreciable mass or hepatosplenomegaly.    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  Remains well compensated.  No history of variceal bleeding.  He is now on Coreg  6.25 mg daily. Euvolemic today.  He has been appropriately vaccinated.  He is due for hepatic ultrasound now And updated labs.  Reflux well-controlled on esomeprazole .  Multiple colonic adenomas removed last year; due for surveillance in 2 years.  Recommendations:   As discussed colonoscopy plan for 2 years from now  We will update blood work c-Met, CBC INR  Ultrasound of your liver every 6 months  Watch the salt intake in your diet 2 g limit daily  Continue Coreg  every day  Continue esomeprazole  daily for reflux.  Plan to see you back in about 9 months  Further recommendations to follow.      Notice: This dictation was prepared with Dragon dictation along with smaller phrase technology. Any transcriptional errors that result from this process are unintentional and may not be corrected upon review.

## 2024-01-11 LAB — CBC WITH DIFFERENTIAL/PLATELET
Basophils Absolute: 0.1 x10E3/uL (ref 0.0–0.2)
Basos: 1 %
EOS (ABSOLUTE): 0.2 x10E3/uL (ref 0.0–0.4)
Eos: 2 %
Hematocrit: 49.1 % (ref 37.5–51.0)
Hemoglobin: 16.3 g/dL (ref 13.0–17.7)
Immature Grans (Abs): 0 x10E3/uL (ref 0.0–0.1)
Immature Granulocytes: 0 %
Lymphocytes Absolute: 2.3 x10E3/uL (ref 0.7–3.1)
Lymphs: 32 %
MCH: 31.6 pg (ref 26.6–33.0)
MCHC: 33.2 g/dL (ref 31.5–35.7)
MCV: 95 fL (ref 79–97)
Monocytes Absolute: 0.8 x10E3/uL (ref 0.1–0.9)
Monocytes: 11 %
Neutrophils Absolute: 3.9 x10E3/uL (ref 1.4–7.0)
Neutrophils: 54 %
Platelets: 152 x10E3/uL (ref 150–450)
RBC: 5.16 x10E6/uL (ref 4.14–5.80)
RDW: 14.5 % (ref 11.6–15.4)
WBC: 7.2 x10E3/uL (ref 3.4–10.8)

## 2024-01-11 LAB — PROTIME-INR
INR: 1 (ref 0.9–1.2)
Prothrombin Time: 11.1 s (ref 9.1–12.0)

## 2024-01-11 LAB — COMPREHENSIVE METABOLIC PANEL WITH GFR
ALT: 31 IU/L (ref 0–44)
AST: 35 IU/L (ref 0–40)
Albumin: 4.1 g/dL (ref 3.8–4.8)
Alkaline Phosphatase: 132 IU/L — ABNORMAL HIGH (ref 47–123)
BUN/Creatinine Ratio: 22 (ref 10–24)
BUN: 23 mg/dL (ref 8–27)
Bilirubin Total: 0.4 mg/dL (ref 0.0–1.2)
CO2: 24 mmol/L (ref 20–29)
Calcium: 9.5 mg/dL (ref 8.6–10.2)
Chloride: 100 mmol/L (ref 96–106)
Creatinine, Ser: 1.03 mg/dL (ref 0.76–1.27)
Globulin, Total: 3.1 g/dL (ref 1.5–4.5)
Glucose: 126 mg/dL — ABNORMAL HIGH (ref 70–99)
Potassium: 4.4 mmol/L (ref 3.5–5.2)
Sodium: 141 mmol/L (ref 134–144)
Total Protein: 7.2 g/dL (ref 6.0–8.5)
eGFR: 78 mL/min/1.73 (ref >59)

## 2024-01-16 ENCOUNTER — Ambulatory Visit: Payer: Self-pay | Admitting: Internal Medicine

## 2024-01-18 ENCOUNTER — Ambulatory Visit (HOSPITAL_COMMUNITY)
Admission: RE | Admit: 2024-01-18 | Discharge: 2024-01-18 | Disposition: A | Discharge status: Home / Self Care | Source: Ambulatory Visit | Attending: Internal Medicine | Admitting: Internal Medicine

## 2024-01-18 DIAGNOSIS — K7469 Other cirrhosis of liver: Secondary | ICD-10-CM | POA: Insufficient documentation

## 2024-01-18 DIAGNOSIS — Z9049 Acquired absence of other specified parts of digestive tract: Secondary | ICD-10-CM | POA: Diagnosis not present

## 2024-01-18 DIAGNOSIS — K7689 Other specified diseases of liver: Secondary | ICD-10-CM | POA: Diagnosis not present

## 2024-01-18 DIAGNOSIS — K7460 Unspecified cirrhosis of liver: Secondary | ICD-10-CM | POA: Diagnosis not present

## 2024-01-21 ENCOUNTER — Ambulatory Visit: Admitting: Internal Medicine

## 2024-01-25 ENCOUNTER — Ambulatory Visit: Payer: Self-pay | Admitting: Internal Medicine

## 2024-02-07 DIAGNOSIS — H524 Presbyopia: Secondary | ICD-10-CM | POA: Diagnosis not present

## 2024-03-08 ENCOUNTER — Ambulatory Visit (HOSPITAL_COMMUNITY)
Admission: RE | Admit: 2024-03-08 | Discharge: 2024-03-08 | Disposition: A | Source: Ambulatory Visit | Attending: Urology

## 2024-03-08 DIAGNOSIS — N200 Calculus of kidney: Secondary | ICD-10-CM | POA: Diagnosis present

## 2024-03-08 DIAGNOSIS — Z0389 Encounter for observation for other suspected diseases and conditions ruled out: Secondary | ICD-10-CM | POA: Diagnosis not present

## 2024-03-08 DIAGNOSIS — Z87442 Personal history of urinary calculi: Secondary | ICD-10-CM | POA: Insufficient documentation

## 2024-03-22 ENCOUNTER — Ambulatory Visit: Payer: PPO | Admitting: Urology

## 2024-03-22 ENCOUNTER — Encounter: Payer: Self-pay | Admitting: Urology

## 2024-03-22 VITALS — BP 143/72 | HR 76

## 2024-03-22 DIAGNOSIS — Z09 Encounter for follow-up examination after completed treatment for conditions other than malignant neoplasm: Secondary | ICD-10-CM

## 2024-03-22 DIAGNOSIS — N200 Calculus of kidney: Secondary | ICD-10-CM

## 2024-03-22 DIAGNOSIS — N401 Enlarged prostate with lower urinary tract symptoms: Secondary | ICD-10-CM

## 2024-03-22 DIAGNOSIS — Z87442 Personal history of urinary calculi: Secondary | ICD-10-CM

## 2024-03-22 LAB — MICROSCOPIC EXAMINATION
Bacteria, UA: NONE SEEN
WBC, UA: NONE SEEN /HPF (ref 0–5)

## 2024-03-22 LAB — URINALYSIS, ROUTINE W REFLEX MICROSCOPIC
Bilirubin, UA: NEGATIVE
Glucose, UA: NEGATIVE
Ketones, UA: NEGATIVE
Leukocytes,UA: NEGATIVE
Nitrite, UA: NEGATIVE
Protein,UA: NEGATIVE
Specific Gravity, UA: 1.01 (ref 1.005–1.030)
Urobilinogen, Ur: 1 mg/dL (ref 0.2–1.0)
pH, UA: 6 (ref 5.0–7.5)

## 2024-03-22 MED ORDER — INDAPAMIDE 2.5 MG PO TABS: 2.5000 mg | ORAL_TABLET | Freq: Every day | ORAL | 3 refills | Status: AC

## 2024-03-22 MED ORDER — FINASTERIDE 5 MG PO TABS: 5.0000 mg | ORAL_TABLET | Freq: Every day | ORAL | 3 refills | Status: AC

## 2024-03-22 NOTE — Patient Instructions (Signed)

## 2024-03-22 NOTE — Progress Notes (Signed)
 03/22/2024 9:40 AM   Mark Leonard September 26, 1950 [id number]  Referring provider: Trudy Vaughn FALCON, MD [address] [place],  [place] [address]  nephrolithiasis  HPI: [name] is a 73yo here for followup for nephrolithiasis. He denies any stone events since last visit. Renal US  from 03/08/2024 shows no calculi and no hydronephrosis.  He denies any worsening LUTS. IPSS 3 QOl 0 on finasteride  5mg  daily. He drinks water  throughout the day      PMH: Past Medical History:  Diagnosis Date   Acid reflux    Allergy    Cirrhosis (HCC)    Natural immunity to hepatitis A.  Completed hepatitis B vaccination May 2024.   COVID 04/2019   stuffy head sinus problems sob fatigue x 2 weeks   Diabetes mellitus without complication (HCC)    Elevated LFTs    since 2013   Fatty liver    Gout    Heart murmur    mild no cardiologist for last 20 yrs    History of kidney stones    Hypertension    Mitral regurgitation    mild    Prostate hypertrophy    Venous stasis    legs brown no vein doctor   Wears glasses     Surgical History: Past Surgical History:  Procedure Laterality Date   CARDIOVASCULAR STRESS TEST     CHOLECYSTECTOMY  late 1990's   COLONOSCOPY     COLONOSCOPY N/A 05/17/2019   Surgeon: Shaaron Lamar HERO, MD;  seven 4-6 mm polyps removed.  Pathology with tubular adenomas and 1 hyperplastic polyp.  Recommended 3-year repeat.   COLONOSCOPY WITH PROPOFOL  N/A 09/17/2022   Surgeon: Shaaron Lamar HERO, MD; Six 4-6 mm polyps removed, sigmoid and descending colon diverticulosis, nonbleeding internal hemorrhoids, anal papilla.  Pathology with tubular adenomas and 1 sessile serrated polyp.  Recommended 3 year surveillance.   CYSTOSCOPY WITH LITHOLAPAXY N/A 04/12/2020   Procedure: CYSTOSCOPY WITH  BLADDER AND KIDNEY LITHOLAPAXY;  Surgeon: Alvaro Hummer, MD;  [facility];  Service: Urology;  Laterality: N/A;   CYSTOSCOPY WITH RETROGRADE PYELOGRAM, URETEROSCOPY AND  STENT PLACEMENT Bilateral 04/12/2020   Procedure: CYSTOSCOPY WITH RETROGRADE PYELOGRAM, URETEROSCOPY AND STENT PLACEMENT;  Surgeon: Alvaro Hummer, MD;  Location: [facility];  Service: Urology;  Laterality: Bilateral;   ESOPHAGOGASTRODUODENOSCOPY (EGD) WITH PROPOFOL  N/A 09/17/2022   Surgeon: Shaaron Lamar HERO, MD; 4 columns of grade 1-2 esophageal varices, portal hypertensive gastropathy.   EXTRACORPOREAL SHOCK WAVE LITHOTRIPSY  last done 18 yrs ago   4-5 times   HOLMIUM LASER APPLICATION Bilateral 04/12/2020   Procedure: HOLMIUM LASER APPLICATION;  Surgeon: Alvaro Hummer, MD;  Location: [facility];  Service: Urology;  Laterality: Bilateral;   KIDNEY STONE SURGERY  1970's done x 2   POLYPECTOMY  05/17/2019   Procedure: POLYPECTOMY;  Surgeon: Shaaron Lamar HERO, MD;  [facility];  Service: Endoscopy;;   POLYPECTOMY  09/17/2022   Procedure: POLYPECTOMY;  Surgeon: Shaaron Lamar HERO, MD;  Location: [facility];  Service: Endoscopy;;   VASECTOMY  late 1990's    Home Medications:  Allergies as of 03/22/2024       Reactions   Tape Other (See Comments)   Adhesive Doris        Medication List        Accurate as of March 22, 2024  9:40 AM. If you have any questions, ask your nurse or doctor.  acetaminophen  650 MG CR tablet Commonly known as: TYLENOL  Take 650 mg by mouth every 8 (eight) hours as needed for pain.   allopurinol  300 MG tablet Commonly known as: ZYLOPRIM  Take 1 tablet (300 mg total) by mouth daily.   blood glucose meter kit and supplies Kit Dispense based on patient and insurance preference. Use daily as directed. (FOR ICD-10 code E11.9)   carvedilol  6.25 MG tablet Commonly known as: Coreg  Take 1 tablet (6.25 mg total) by mouth 2 (two) times daily.   cetirizine  10 MG tablet Commonly known as: ZyrTEC  Allergy Take 1 tablet (10 mg total) by mouth daily.   chlorzoxazone  500 MG tablet Commonly known as:  PARAFON  Take by mouth as needed (Back spasm).   esomeprazole  40 MG capsule Commonly known as: NEXIUM  TAKE 1 CAPSULE DAILY BEFOREBREAKFAST   finasteride  5 MG tablet Commonly known as: PROSCAR  Take 1 tablet (5 mg total) by mouth daily.   fluticasone  50 MCG/ACT nasal spray Commonly known as: FLONASE  Place 2 sprays into both nostrils daily.   glipiZIDE 5 MG tablet Commonly known as: GLUCOTROL Take 5 mg by mouth every morning.   glucose blood test strip Use to test blood sugar once daily. Use as instructed. One touch, verio strips. Dx:E11.9   ibuprofen 200 MG tablet Commonly known as: ADVIL Take 400 mg by mouth every 6 (six) hours as needed for mild pain or moderate pain.   indapamide  2.5 MG tablet Commonly known as: LOZOL  Take 1 tablet (2.5 mg total) by mouth daily.   Lantus SoloStar 100 UNIT/ML Solostar Pen Generic drug: insulin  glargine Inject 14 Units into the skin daily.        Allergies: Allergies[1]  Family History: Family History  Problem Relation Age of Onset   Hypertension Mother    Pulmonary disease Father    Cirrhosis Maternal Grandfather        unknown etiology, non-alcoholic   Liver cancer Maternal Grandfather    Cirrhosis Cousin        unknown etiology, non-alcoholic   Liver cancer Cousin    Cirrhosis Cousin        alcoholic   Colon cancer Neg Hx     Social History:  reports that he quit smoking about 24 years ago. His smoking use included cigarettes. He started smoking about 51 years ago. He has a 52 pack-year smoking history. He quit smokeless tobacco use about 25 years ago. He reports that he does not drink alcohol  and does not use drugs.  ROS: All other review of systems were reviewed and are negative except what is noted above in HPI  Physical Exam: BP (!) 143/72   Pulse 76   Constitutional:  Alert and oriented, No acute distress. HEENT: [place] AT, moist mucus membranes.  Trachea midline, no masses. Cardiovascular: No clubbing, cyanosis, or  edema. Respiratory: Normal respiratory effort, no increased work of breathing. GI: Abdomen is soft, nontender, nondistended, no abdominal masses GU: No CVA tenderness.  Lymph: No cervical or inguinal lymphadenopathy. Skin: No rashes, bruises or suspicious lesions. Neurologic: Grossly intact, no focal deficits, moving all 4 extremities. Psychiatric: Normal mood and affect.  Laboratory Data: Lab Results  Component Value Date   WBC 7.2 01/10/2024   HGB 16.3 01/10/2024   HCT 49.1 01/10/2024   MCV 95 01/10/2024   PLT 152 01/10/2024    Lab Results  Component Value Date   CREATININE 1.03 01/10/2024    No results found for: PSA  No results found for: TESTOSTERONE  Lab  Results  Component Value Date   HGBA1C 7.3 (H) 06/08/2022    Urinalysis    Component Value Date/Time   COLORURINE YELLOW 06/08/2022 2012   APPEARANCEUR Clear 10/14/2023 1526   LABSPEC 1.012 06/08/2022 2012   PHURINE 5.0 06/08/2022 2012   GLUCOSEU Negative 10/14/2023 1526   HGBUR MODERATE (A) 06/08/2022 2012   BILIRUBINUR Negative 10/14/2023 1526   KETONESUR NEGATIVE 06/08/2022 2012   PROTEINUR Negative 10/14/2023 1526   PROTEINUR 100 (A) 06/08/2022 2012   NITRITE Negative 10/14/2023 1526   NITRITE NEGATIVE 06/08/2022 2012   LEUKOCYTESUR Negative 10/14/2023 1526   LEUKOCYTESUR LARGE (A) 06/08/2022 2012    Lab Results  Component Value Date   LABMICR Comment 10/14/2023   WBCUA 0-5 12/02/2022   LABEPIT 0-10 12/02/2022   MUCUS Present (A) 08/12/2022   BACTERIA None seen 12/02/2022    Pertinent Imaging: Renal US  03/08/2024: Images reviewed and discussed with the patient  Results for orders placed during the hospital encounter of 12/15/22  Abdomen 1 view (KUB)  Narrative CLINICAL DATA:  Nephrolithiasis.  EXAM: ABDOMEN - 1 VIEW  COMPARISON:  Radiograph 11/30/2022.  CT 06/08/2022  FINDINGS: The punctate left intrarenal calculi on prior CT have no radiographic correlate. No visualized  urolithiasis. Normal bowel gas pattern. Small to moderate colonic stool burden. Cholecystectomy clips in the right upper quadrant.  IMPRESSION: The punctate left intrarenal calculi on prior CT have no radiographic correlate. No urolithiasis demonstrated by radiograph.   Electronically Signed By: Andrea Gasman M.D. On: 12/26/2022 09:56  No results found for this or any previous visit.  No results found for this or any previous visit.  No results found for this or any previous visit.  Results for orders placed during the hospital encounter of 03/08/24  Ultrasound renal complete  Narrative EXAM: US  Retroperitoneum Complete, Renal. 03/08/2024 12:05:32 PM  TECHNIQUE: Real-time ultrasonography of the retroperitoneum renal was performed.  COMPARISON: US  Renal 03/09/2023.  CLINICAL HISTORY: Nephrolithiasis.  FINDINGS:  FINDINGS: RIGHT KIDNEY/URETER: Right kidney measures 9.8 x 4.6 x 5.1 cm. Normal cortical echogenicity. No hydronephrosis. No visible stone or mass. Right ureteral jet is visualized. Visualization of the right kidney is limited by bowel gas.  LEFT KIDNEY/URETER: Left kidney measures 10.5 x 4.3 x 4.1 cm. Normal cortical echogenicity. No hydronephrosis. No calculus. No mass.  BLADDER: Unremarkable appearance of the bladder.  IMPRESSION: 1. No hydronephrosis.  Electronically signed by: Franky Crease MD 03/14/2024 11:35 PM EST RP Workstation: [id number]  No results found for this or any previous visit.  No results found for this or any previous visit.  No results found for this or any previous visit.   Assessment & Plan:    1. Nephrolithiasis (Primary) -followup 1 year with Renal US  - Urinalysis, Routine w reflex microscopic   No follow-ups on file.  Belvie Clara, MD  [facility]      [1]  Allergies Allergen Reactions   Tape Other (See Comments)    Pearlean Doris

## 2024-03-23 ENCOUNTER — Encounter: Payer: Self-pay | Admitting: Internal Medicine

## 2025-02-26 ENCOUNTER — Other Ambulatory Visit (HOSPITAL_COMMUNITY)

## 2025-03-09 ENCOUNTER — Ambulatory Visit: Admitting: Urology
